# Patient Record
Sex: Male | Born: 1960 | Race: Asian | Hispanic: No | Marital: Married | Smoking: Current every day smoker
Health system: Southern US, Community
[De-identification: ages and names within clinical notes are randomized; demographics above are authoritative.]

## PROBLEM LIST (undated history)

## (undated) DIAGNOSIS — D649 Anemia, unspecified: Secondary | ICD-10-CM

## (undated) DIAGNOSIS — Z9481 Bone marrow transplant status: Secondary | ICD-10-CM

## (undated) DIAGNOSIS — Z923 Personal history of irradiation: Secondary | ICD-10-CM

## (undated) DIAGNOSIS — M549 Dorsalgia, unspecified: Secondary | ICD-10-CM

## (undated) DIAGNOSIS — M1990 Unspecified osteoarthritis, unspecified site: Secondary | ICD-10-CM

## (undated) DIAGNOSIS — Z5111 Encounter for antineoplastic chemotherapy: Secondary | ICD-10-CM

## (undated) HISTORY — DX: Anemia, unspecified: D64.9

## (undated) HISTORY — DX: Encounter for antineoplastic chemotherapy: Z51.11

## (undated) HISTORY — DX: Bone marrow transplant status: Z94.81

## (undated) HISTORY — DX: Unspecified osteoarthritis, unspecified site: M19.90

## (undated) HISTORY — DX: Personal history of irradiation: Z92.3

## (undated) HISTORY — DX: Dorsalgia, unspecified: M54.9

---

## 2003-11-18 ENCOUNTER — Emergency Department (HOSPITAL_COMMUNITY): Admission: EM | Admit: 2003-11-18 | Discharge: 2003-11-18 | Payer: Self-pay | Admitting: Emergency Medicine

## 2003-11-26 ENCOUNTER — Ambulatory Visit (HOSPITAL_COMMUNITY): Admission: RE | Admit: 2003-11-26 | Discharge: 2003-11-26 | Payer: Self-pay | Admitting: Orthopedic Surgery

## 2003-11-26 ENCOUNTER — Ambulatory Visit (HOSPITAL_BASED_OUTPATIENT_CLINIC_OR_DEPARTMENT_OTHER): Admission: RE | Admit: 2003-11-26 | Discharge: 2003-11-26 | Payer: Self-pay | Admitting: Orthopedic Surgery

## 2003-11-26 HISTORY — PX: OTHER SURGICAL HISTORY: SHX169

## 2007-02-04 ENCOUNTER — Emergency Department (HOSPITAL_COMMUNITY): Admission: EM | Admit: 2007-02-04 | Discharge: 2007-02-04 | Payer: Self-pay | Admitting: Emergency Medicine

## 2007-10-08 ENCOUNTER — Inpatient Hospital Stay (HOSPITAL_COMMUNITY): Admission: EM | Admit: 2007-10-08 | Discharge: 2007-10-09 | Payer: Self-pay | Admitting: Emergency Medicine

## 2008-10-13 ENCOUNTER — Inpatient Hospital Stay (HOSPITAL_COMMUNITY): Admission: EM | Admit: 2008-10-13 | Discharge: 2008-10-16 | Payer: Self-pay | Admitting: Emergency Medicine

## 2010-06-03 LAB — BASIC METABOLIC PANEL
BUN: 15 mg/dL (ref 6–23)
BUN: 8 mg/dL (ref 6–23)
CO2: 24 mEq/L (ref 19–32)
Calcium: 9.1 mg/dL (ref 8.4–10.5)
Chloride: 106 mEq/L (ref 96–112)
Creatinine, Ser: 1.04 mg/dL (ref 0.4–1.5)
GFR calc Af Amer: >60 mL/min (ref >60)
GFR calc Af Amer: >60 mL/min (ref >60)
Potassium: 4.1 mEq/L (ref 3.5–5.1)

## 2010-06-03 LAB — FECAL LACTOFERRIN, QUANT

## 2010-06-03 LAB — GIARDIA/CRYPTOSPORIDIUM SCREEN(EIA)
Cryptosporidium Screen (EIA): NEGATIVE
Giardia Screen - EIA: NEGATIVE

## 2010-06-03 LAB — HEPATIC FUNCTION PANEL
AST: 17 U/L (ref 0–37)
Albumin: 2.4 g/dL — ABNORMAL LOW (ref 3.5–5.2)
Alkaline Phosphatase: 42 U/L (ref 39–117)
Bilirubin, Direct: 0.1 mg/dL (ref 0.0–0.3)
Indirect Bilirubin: 0.7 mg/dL (ref 0.3–0.9)
Total Protein: 7 g/dL (ref 6.0–8.3)

## 2010-06-03 LAB — CULTURE, BLOOD (ROUTINE X 2): Culture: NO GROWTH

## 2010-06-03 LAB — DIFFERENTIAL
Basophils Relative: 0 % (ref 0–1)
Basophils Relative: 0 % (ref 0–1)
Eosinophils Absolute: 0.1 10*3/uL (ref 0.0–0.7)
Lymphocytes Relative: 25 % (ref 12–46)
Lymphs Abs: 1.4 10*3/uL (ref 0.7–4.0)
Monocytes Absolute: 0.1 10*3/uL (ref 0.1–1.0)
Monocytes Relative: 4 % (ref 3–12)
Neutro Abs: 3.8 10*3/uL (ref 1.7–7.7)
Neutrophils Relative %: 44 % (ref 43–77)
Neutrophils Relative %: 69 % (ref 43–77)

## 2010-06-03 LAB — EXPECTORATED SPUTUM ASSESSMENT W GRAM STAIN, RFLX TO RESP C

## 2010-06-03 LAB — URINALYSIS, ROUTINE W REFLEX MICROSCOPIC
Glucose, UA: NEGATIVE mg/dL
Hgb urine dipstick: NEGATIVE
Protein, ur: NEGATIVE mg/dL
Specific Gravity, Urine: 1.037 — ABNORMAL HIGH (ref 1.005–1.030)
Urobilinogen, UA: 1 mg/dL (ref 0.0–1.0)
pH: 6 (ref 5.0–8.0)

## 2010-06-03 LAB — CBC
HCT: 30 % — ABNORMAL LOW (ref 39.0–52.0)
MCHC: 32.9 g/dL (ref 30.0–36.0)
MCHC: 33 g/dL (ref 30.0–36.0)
MCV: 85.9 fL (ref 78.0–100.0)
MCV: 86.3 fL (ref 78.0–100.0)
RBC: 3.65 MIL/uL — ABNORMAL LOW (ref 4.22–5.81)
RBC: 4.39 MIL/uL (ref 4.22–5.81)
WBC: 5.6 10*3/uL (ref 4.0–10.5)

## 2010-06-03 LAB — STOOL CULTURE

## 2010-06-03 LAB — LIPASE, BLOOD: Lipase: 18 U/L (ref 11–59)

## 2010-06-03 LAB — HEPATITIS C ANTIBODY: HCV Ab: NEGATIVE

## 2010-07-11 NOTE — H&P (Signed)
Shane Woodard                    ACCOUNT NO.:  [id number]   MEDICAL RECORD NO.:  [id number]          PATIENT TYPE:  INP   LOCATION:  5121                         FACILITY:  [facility]   PHYSICIAN:  Virgie Dad, MD     DATE OF BIRTH:  November 04, 1960   DATE OF ADMISSION:  10/13/2008  DATE OF DISCHARGE:                              HISTORY & PHYSICAL   PRIMARY CARE PHYSICIAN:  Unassigned.   CHIEF COMPLAINT:  Abdominal pain of 5 days duration, vomiting of 5 days  duration every time he takes anything by mouth including his medicine  Cipro and omeprazole.   HISTORY OF PRESENT ILLNESS:  This is a 50 year old [place] immigrant,  living in [place] for more than 12 years, born in [place] and the  last visit to [place] was in the year 2000.  He is complaining of  periumbilical pain and crampy abdominal pain lasting 2-3 seconds, so  painful that he doubles up.  This has been going on for 5 days and  remembered it started after taking a rice dinner at home.  The rest of  the family including wife and 2 children do not have the similar  condition.  He was seen by an MD and was given Cipro for food  poisoning but then when he takes Cipro, he would throw up and throw up  and that he has not drunk and eaten for 5 days, whether he takes juice  or even a little food he starts vomiting.  He has no fever.  He did not  have any similar episode of this in the past.   PAST MEDICAL HISTORY:  No previous surgery.  He was admitted last year  for complaints of arthralgia and arthritis and had a workup for this  particular complaint but no definitive diagnosis.   SOCIAL HISTORY:  He works at [employer] and used to smoke a pack a day,  but has not smoked for the past 1 year and he drinks beer every now and  then.   FAMILY HISTORY:  The family, wife and 2 children are healthy.  No  diabetes.  No hypertension.  No illness that runs in the family.  The  patient himself states he has been healthy while in  [place] and was not  treated for any gastroenteritis or parasite.   REVIEW OF SYSTEMS:  He has chronic cough for the last 10 years, cough is  nonproductive, which he blames the cigarettes, but he had already  stopped smoking and he still continues to cough and usually with  wheezing.  He had seen a doctor and was told it was allergy.  No weight  loss before but believe that since he has been vomiting for 5 days, he  had lost 10 pounds.  Pertinent complaints of polyarthralgia of the major  joints and PIP for the last 5-6 days.  He has been having abdominal pain  and vomiting.  Denies any urinary problems.  Denies any exposure to  venereal disease.  No history of hepatitis.   LABORATORY DATA:  Lab  work in the emergency room, WBC 5,600, hemoglobin  12.3, hematocrit 37.7, and platelet 280,000.  There is no shift to the  left.  Sodium 133, potassium 4.1, chloride 102, CO2 23, glucose 104, BUN  15, creatinine 1, calcium 9.1, and lipase is 18.  Urinalysis is  negative.  Lactic acid is 0.9, albumin is 2.4, and liver function test  is normal.  CT of the abdomen showed that he has mucosal thickening of  the small bowel of the mid abdomen.  There is no transition point of the  air fluid levels.  Differential diagnoses include inflammation,  infection, or ischemia.   Dr. Andrey Campanile; Surgery has been called and he came to the see the patient.  No surgical intervention and he does not believe that the patient's  abnormal CT is from ischemic mesenteritis.   PHYSICAL EXAMINATION:  VITAL SIGNS:  Temperature 98.2, blood pressure  128/90, pulse 82, respiration 20, and O2 sat on room air is 99%.  SKIN:  Warm and dry.  No dermatitis.  HEENT:  Pupils are equal and reacting to light.  No scleral icterus.  No  subconjunctival hemorrhage.  Oral mucosa moist.  NECK:  Neck veins are flat.  Carotid upstroke normal.  Thyroid gland is  normal.  CHEST:  Bilateral wheezing.  Cleared on coughing and the patient has   nonproductive cough.  COR:  90 per minute.  S1 and S2 normal.  No S3.  No S4.  No murmur.  ABDOMEN:  Periumbilical tenderness but minimal guarding.  Bowel sounds  are normoactive.  There is no rebound tenderness.  No bruit heard over  the abdominal aorta.  No palpable masses.  EXTREMITIES:  No sign of DVT.  No pedal edema.  Pedal pulses are normal.  NEUROLOGIC:  Entirely normal both cerebral, cerebellar, cranial, and  spinal nerves.   ASSESSMENT AND PLAN:  1. Abdominal pain with vomiting.  Not able to tolerate p.o.      medication.  Possible cause of mid abdomen mucosal thickening and      ileus, acute gastroenteritis like Salmonella endotoxin poisoning.      Stools are sent for fecal leukocyte.  Culture on ova and parasite.      We will keep the patient n.p.o. for tonight, hydrate with normal      saline 125 mL/hour.  Potassium is normal at 4.1, so no addition of      potassium to the IV fluid.  We will restart IV Cipro 400 mg every      12 hours.  Pending blood cultures.  2. Polyarthralgia with swelling of the proximal interphalangeal.  We      will order CRP and RA factor.  3. Chronic cough on previously cigarette smoking patient.  We will      give albuterol for the bronchospasm.  Once stable, we will order      pulmonary function test to assess pulmonary function and rule out      chronic obstructive pulmonary disease or restrictive lung disease.   PROGNOSIS:  Good.   EFFECTS:  Full Code.      Virgie Dad, MD  Electronically Signed     EA/MEDQ  D:  10/13/2008  T:  10/14/2008  Job:  [id number]

## 2010-07-11 NOTE — Discharge Summary (Signed)
Shane Woodard, SURGEON                    ACCOUNT NO.:  [id number]   MEDICAL RECORD NO.:  [id number]          PATIENT TYPE:  INP   LOCATION:  5121                         FACILITY:  [facility]   PHYSICIAN:  Beckey Rutter, MD  DATE OF BIRTH:  1960-11-24   DATE OF ADMISSION:  10/13/2008  DATE OF DISCHARGE:  10/16/2008                               DISCHARGE SUMMARY   PRIMARY CARE PHYSICIAN:  Unassigned to [facility].   BRIEF HISTORY OF PRESENT ILLNESS:  A very pleasant [place] gentleman  presented with abdominal pain for 5 days' duration and vomiting for 5  days' duration.   HOSPITAL COURSE:  During the hospital course, the patient was kept  n.p.o. with improvement of his symptoms.  He had CT scan as the results  suggesting ischemia versus nonspecific bowel inflammation.  The patient  was continued on intravenous Cipro and since yesterday started on food.  The patient is able to tolerate regular food currently.  He is stable  for discharge.   DISCHARGE CONSULTATION:  [name], M.D., with [facility].   LABORATORY TEST:  His Giardia was negative.  His stool culture is  negative.  Blood culture is negative.  Recent BMET showing sodium 134,  potassium 3.6, chloride 106, bicarb is 24, glucose 72, BUN is 8;  creatinine is 1.0.  White blood count as of yesterday is 3.4, hemoglobin  is 10.4, hematocrit is 31.5, platelet count is 250.  Fecal lactoferrin  is negative.  Sputum culture is negative.  Hepatitis C antibody is  negative.  Hepatitis B surface antibody is negative.  Hepatitis A  antibody is negative.  C-reactive protein is 1.3.  Amylase is 66.  Lipase is 18.   DISCHARGE DIAGNOSES:  1. Nonspecific enteritis could be secondary to Salmonella toxin.  2. Nausea and vomiting, resolved.   DISCHARGE MEDICATIONS:  Cipro 500 mg p.o. twice a day for 3 more days.   DISCHARGE PLAN:  The patient was discharged to follow up with her  primary physician within 1-2 weeks.  He is  aware and agreeable to  discharge plan.      Beckey Rutter, MD  Electronically Signed     EME/MEDQ  D:  10/16/2008  T:  10/16/2008  Job:  [id number]
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
## 2010-07-11 NOTE — Discharge Summary (Signed)
Shane Woodard, Shane Woodard                    ACCOUNT NO.:  [id number]   MEDICAL RECORD NO.:  [id number]          PATIENT TYPE:  INP   LOCATION:  6731                         FACILITY:  [facility]   PHYSICIAN:  Renee Ramus, MD       DATE OF BIRTH:  19-Jun-1960   DATE OF ADMISSION:  10/08/2007  DATE OF DISCHARGE:  10/09/2007                               DISCHARGE SUMMARY   PRIMARY DIAGNOSIS:  Bronchitis.   SECONDARY DIAGNOSES:  1. Potential Lyme disease.  2. Chronic back pain.  3. Anemia.   HOSPITAL COURSE:  1. Bronchitis.  The patient is a 50 year old Asian male who was      admitted secondary to bronchitis and severe back pain as well as      fevers and chills.  The patient was initially treated empirically      for meningitis.  He did receive a spinal tap which showed no      evidence of acute bacterial or viral infection.  The patient did      receive IV fluids and antibiotics.  He has now made a good clinical      recovery.  The patient did not have evidence of acute inflammation.      He had a normal white count.  He had no fevers while in-house and      all of his labs and studies had been negative for acute infection,      no acute disease.  The patient is now being discharged to home with      instructions to follow up with his primary care physician as needed      and he will be continued on doxycycline empirically since he had a      rash upon admission.  His doxycycline dosage will continue for the      next 10 days.  2. Lyme disease as above.  Lyme titers are currently pending.  This      will be reviewed upon return.  3. Bronchitis.  This is now resolved.  The patient had no evidence of      mild bronchitis upon chest x-ray but his clinical exam shows no      evidence of acute infection or inflammatory reaction.  4. Anemia.  The patient had a normocytic anemia and I have deferred      workup for this as outpatient.   LABORATORY DATA:  1. Mild anemia with a hemoglobin of 10 and  hematocrit of 31 with MCV      of 86.7.  2. Mild hypoalbuminemia with an albumin of 2.7.  3. UA and CSF cultures and studies were negative for acute infection.   STUDIES:  1. CT of the head shows no acute disease.  2. Chest x-ray showing mild bronchitis, but by my read chest x-ray      shows no acute disease.   DISCHARGE MEDICATIONS:  Doxycycline 100 mg 1 p.o. b.i.d. x10 days.   There were no labs or studies pending at the time of discharge.  The  patient is in stable  condition and anxious for discharge.   Time spent 35 minutes.      Renee Ramus, MD  Electronically Signed     JF/MEDQ  D:  10/09/2007  T:  10/10/2007  Job:  [id number]

## 2010-07-11 NOTE — H&P (Signed)
NAMEJOHNNATHAN, HAGEMEISTER                    ACCOUNT NO.:  [id number]   MEDICAL RECORD NO.:  [id number]          PATIENT TYPE:  EMS   LOCATION:  MAJO                         FACILITY:  [facility]   PHYSICIAN:  Michelene Gardener, MD    DATE OF BIRTH:  06/29/1960   DATE OF ADMISSION:  10/08/2007  DATE OF DISCHARGE:                              HISTORY & PHYSICAL   PRIMARY PHYSICIAN:  The patient admitted as unassigned.   CHIEF COMPLAINT:  Fever, chills, back pain and neck pain.   HISTORY OF PRESENT ILLNESS:  This is a 50 year old [place] male with  no significant past medical history who presents with the above-  mentioned complaint.  This patient is a very poor historian and has  multiple complaints.  The main reason he came to the ER was because he  had been having back pain for the last 6 months that was not improving.  He has been followed by his primary physician, and his pain was mostly  attributed to muscular pain and has been treated with pain medications  without much help.  His pain is mainly in the area of the right scapula.  It is described as a sharp pain with radiation to his right shoulder on  some occasions.  Last time he was seen by his doctor he was given rest  for 1 week and was given some pain medicine but without help.  He was  also complaining of some neck pain and difficulty moving his neck.  Starting this Friday, he has been having fever on and off, but he has  never measured his temperature at home.  He was also having chills  associated with that fever.  He also had some erythema, a rash in his  abdominal area and around the left side of his back, but he does not  know for how long this has been present. She also has dry cough started  yesterday and was coughing during my interview. In the ER, he had a  fever of 101.0.  White count was normal.  A chest x-ray showed no acute  problems.  A CT scan of his head was normal.  The patient was given 2  grams of IV Rocephin, 1.5  grams of vancomycin.  Lumbar puncture was  done, and results are pending at this time.   PAST MEDICAL HISTORY:  Denied.   PAST SURGICAL HISTORY:  Denied.   ALLERGIES:  No known drug allergies.   CURRENT MEDICATIONS:  Denied.   SOCIAL HISTORY:  He smokes 1/2 pack per day and he has been smoking  since he was 50 years old.  He drinks beer on the weekend, and he  stopped spirit drinking around 10 years ago.  He denies recreational  drugs.   FAMILY HISTORY:  The patient does not know much about his family.   REVIEW OF SYSTEMS:  CONSTITUTIONAL:  Positive for fever, chills,  sweating and fatigability.  EYES:  No blurred vision but actually he had  one occasion of blurred vision, loss of vision in his right eye.  EARS:  No tinnitus.  RESPIRATORY:  Positive for cough.  GASTROINTESTINAL:  No  nausea, no vomiting, no diarrhea.  GENITOURINARY:  No dysuria, no  hematuria.  ENDOCRINE:  No polyuria, no nocturia.  HEMATOLOGIC:  No  bruises, no bleeding.  ID:  Positive for rash.  There are no lesions.  NEURO:  No numbness or tingling.  The rest of the systems were reviewed,  and they were negative.   PHYSICAL EXAMINATION:  VITAL SIGNS:  Temperature 101, blood pressure  130/79, pulse 103, respiratory rate is 18.  GENERAL APPEARANCE:  This is a middle-aged Asian male, not in acute  distress.  HEENT:  His conjunctivae are red.  His pupils are equal and reactive to  light.  There is no ptosis.  Hearing is intact.  There is no ear  discharge or infection.  There is no nose infection or bleeding.  Oral  mucosa is dry.  No pharyngeal erythema.  NECK:  Stiff with mild problem moving his neck.  There is no tenderness.  There is no thyroid enlargement.  There is no lymphadenopathy, and there  is no carotid bruit.  CARDIOVASCULAR:  S1 and S2 are regular.  There are no murmurs, no  gallops and no thrills.  RESPIRATORY:  The patient is breathing between 16 to 18.  There are no  rales, no rhonchi, and  there are no wheezes.  ABDOMEN:  Soft.  Not distended.  No tenderness.  No hepatosplenomegaly.  Bowel sounds are present.  EXTREMITIES:  Lower extremities:  No edema, no rash, and no varicose  veins.  SKIN:  No rash and no erythema.  NEURO:  Cranial nerves are intact from II-XII.  Motor exam:  Strength is  5/5 in all 4 extremities.  Reflexes are 2/2 in all major reflexes.  Sensation is intact.   LABORATORY RESULTS:  WBC 5.1, hemoglobin 10.1, hematocrit 30.9, MCV  86.5, platelet count is 279.  Sodium 133, potassium 3.6, chloride 102,  bicarb 25, glucose 94, BUN 11, creatinine 0.98.  Bilirubin 0.4, alkaline  phosphatase 48, AST 19, ALT 13.  A chest x-ray showed no acute findings.  A CT scan showed no intracranial abnormalities.   IMPRESSION:  1. Systemic inflammatory response syndrome.  2. Rule out meningitis.  3. Rule out H1N1 flu.  4. Chronic back pain, most likely secondary to muscular strain.  5. Rash, rule out Lyme disease.   PLAN:  1. Systemic inflammatory response syndrome.  This patient had fever,      generalized weakness.  So far, source of infection is unclear.  His      chest x-ray is normal.  Meningitis would be one of the      possibilities given the fact that his neck is stiff ( although he      has had this stiffness and neck for a long time).  He already      received one dose of Rocephin and one dose of vancomycin in the ER.      LP was done, and results are still pending.  I will start him on      Rocephin 2 grams IV once a day, vancomycin 1 gram IV q.12 h and      ampicillin 500 mg IV q. 4 hours and acyclovir 10 mg/kg bid.  I will      also put him in isolation.  We will follow his LP results.  I will      also send for urinalysis, urine  culture and a sputum culture and      blood culture to cover the possibilities of other source of      infection.  2. Rule out H1N1 flu.  As mentioned, this patient has fever with      generalized weakness, and he started  coughing in the last 2 days,      and while I was interviewing him he had a lot of cough.  We will      start him with Tamiflu.  I will get H1N1 swab, and we will put him      in droplet isolation.  3. Chronic back pain.  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  Upon his exam, his right scapula is tender, and his pain most      likely is muscular pain related to his work.  We will put him on      pain medications as needed.  Will get ESR  4. Neck pain and stiffness.  As mentioned, the possibility of      meningitis is present, but I think it is low possibility, and most      likely this pain and stiffness is all related to his work because      it has been chronic for more than a few months.  5. Rash.  This patient has a rash in his abdomen, which is suspicious      for Lyme disease.  Lyme ELISA is already sent by the ER.  I will      start the patient on doxycycline 100 mg twice daily for a total of      14 days.   Assessment time is 50 minutes.      Michelene Gardener, MD  Electronically Signed     NAE/MEDQ  D:  10/08/2007  T:  10/08/2007  Job:  [id number]

## 2010-07-14 NOTE — Op Note (Signed)
Shane Woodard, Shane Woodard                    ACCOUNT NO.:  [id number]   MEDICAL RECORD NO.:  [id number]          PATIENT TYPE:  AMB   LOCATION:  DSC                          FACILITY:  [facility]   PHYSICIAN:  Katy Fitch. Sypher Jr., M.D.DATE OF BIRTH:  1960-09-27   DATE OF PROCEDURE:  11/26/2003  DATE OF DISCHARGE:                                 OPERATIVE REPORT   PREOPERATIVE DIAGNOSIS:  Laceration of dorsal aspect of left thumb  metacarpophalangeal joint with loss of extension of interphalangeal joint  and extensor lag at metacarpophalangeal joint.   POSTOPERATIVE DIAGNOSIS:  Laceration of dorsal aspect of left thumb  metacarpophalangeal joint with loss of extension of interphalangeal joint  and extensor lag at metacarpophalangeal joint.   OPERATION:  Repair of extensor pollicis longus and extensor aponeurosis,  left thumb metacarpophalangeal joint.   OPERATING SURGEON:  Katy Fitch. Sypher, M.D.   ASSISTANT:  Jonni Sanger, P.A.   ANESTHESIA:  General by LMA.   SUPERVISING ANESTHESIOLOGIST:  Maren Beach, M.D.   INDICATIONS:  [name] was brought to the operating room and placed in  supine position on the operating table.  Following induction of general  anesthesia by LMA, the left arm was prepped with Betadine soaping solution  and sterilely draped.  Following exsanguination of the limb with an Esmarch  bandage, an arterial tourniquet on the proximal brachium was inflated to 220  mmHg.   Procedure commenced with a removal of the sutures placed in the emergency  room.  The wound was then irrigated and foreign material removed.  The  injury involved the entire ulnar aspect of the extensor aponeurosis and the  extensor pollicis longus tendon.  A portion of the extensor pollicis brevis  remained intact.   The joint capsule had been violated.  The joint was thoroughly irrigated  with sterile saline.  One gram of Ancef was administered as an IV  prophylactic antibiotic, once the  joint injury was identified.   The extensor pollicis longus tendon was repaired with a coarse suture of 4-0  Mersilene with grasping technique, followed by use of multiple mattress  sutures of 4-0 Mersilene to repair the extensor aponeurosis anatomically.   The extensor lag at the MP and IP joint was corrected.   The wound was then repaired with intradermal 3-0 Prolene.  A compressive  dressing was applied with the thumb spica splint, maintaining the IP joint  and MP joint in full extension.  There were no apparent complications.   Mr. Sidor will be discharged with prescriptions for Vicodin 5 mg 1 p.o. q.4-6  h. p.r.n. pain, 30 tablets without refill, also Keflex 500 mg 1 p.o. q.8 h.  x4 days as a prophylactic antibiotic.   He is expected to return to office in followup in a week to be reexamined.  We anticipate suture removal at that time and advancement to a thermoplastic  splint.       RVS/MEDQ  D:  11/26/2003  T:  11/27/2003  Job:  [id number]

## 2010-11-24 LAB — URINALYSIS, MICROSCOPIC ONLY
Bilirubin Urine: NEGATIVE
Hgb urine dipstick: NEGATIVE
Ketones, ur: NEGATIVE
Nitrite: NEGATIVE
Protein, ur: NEGATIVE
Specific Gravity, Urine: 1.024
Urobilinogen, UA: 1

## 2010-11-24 LAB — PROTEIN, CSF: Total  Protein, CSF: 35

## 2010-11-24 LAB — BASIC METABOLIC PANEL
CO2: 25
Chloride: 110
Creatinine, Ser: 0.93
GFR calc Af Amer: >60
Potassium: 4.6
Sodium: 139

## 2010-11-24 LAB — GLUCOSE, CSF: Glucose, CSF: 54

## 2010-11-24 LAB — COMPREHENSIVE METABOLIC PANEL
BUN: 11
CO2: 25
Calcium: 8.7
Creatinine, Ser: 0.98
GFR calc Af Amer: >60
GFR calc non Af Amer: >60
Glucose, Bld: 94

## 2010-11-24 LAB — CSF CELL COUNT WITH DIFFERENTIAL
RBC Count, CSF: 0
RBC Count, CSF: 0
WBC, CSF: 4
WBC, CSF: 5

## 2010-11-24 LAB — EXPECTORATED SPUTUM ASSESSMENT W GRAM STAIN, RFLX TO RESP C

## 2010-11-24 LAB — URINE CULTURE: Colony Count: NO GROWTH

## 2010-11-24 LAB — DIFFERENTIAL
Basophils Absolute: 0
Lymphocytes Relative: 21
Lymphs Abs: 1.1
Neutrophils Relative %: 73

## 2010-11-24 LAB — CBC
HCT: 30.9 — ABNORMAL LOW
HCT: 31 — ABNORMAL LOW
Hemoglobin: 10.1 — ABNORMAL LOW
Hemoglobin: 10.1 — ABNORMAL LOW
MCHC: 32.7
MCV: 86.5
RBC: 3.57 — ABNORMAL LOW
RBC: 3.58 — ABNORMAL LOW
RDW: 15.7 — ABNORMAL HIGH
WBC: 6

## 2010-11-24 LAB — CSF CULTURE W GRAM STAIN: Culture: NO GROWTH

## 2010-11-24 LAB — CULTURE, BLOOD (ROUTINE X 2)

## 2010-11-24 LAB — URINALYSIS, ROUTINE W REFLEX MICROSCOPIC
Bilirubin Urine: NEGATIVE
Hgb urine dipstick: NEGATIVE
Nitrite: NEGATIVE
Specific Gravity, Urine: 1.02
pH: 7.5

## 2010-11-24 LAB — GRAM STAIN

## 2010-11-24 LAB — H1N1 SCREEN (PCR): H1N1 Virus Scrn: NOT DETECTED

## 2010-11-24 LAB — RPR: RPR Ser Ql: NONREACTIVE

## 2010-11-24 LAB — ROCKY MTN SPOTTED FVR AB, IGM-BLOOD: RMSF IgM: 0.07 IV

## 2010-11-24 LAB — SEDIMENTATION RATE: Sed Rate: 136 — ABNORMAL HIGH

## 2010-11-24 LAB — MAGNESIUM: Magnesium: 1.9

## 2010-11-24 LAB — B. BURGDORFI ANTIBODIES: B burgdorferi Ab IgG+IgM: 0.05

## 2010-11-24 LAB — PHOSPHORUS: Phosphorus: 2.9

## 2010-12-04 LAB — URINALYSIS, ROUTINE W REFLEX MICROSCOPIC
Hgb urine dipstick: NEGATIVE
Nitrite: NEGATIVE
Specific Gravity, Urine: 1.031 — ABNORMAL HIGH
Urobilinogen, UA: 1
pH: 6

## 2010-12-04 LAB — URINE MICROSCOPIC-ADD ON

## 2011-03-01 ENCOUNTER — Other Ambulatory Visit: Payer: Self-pay | Admitting: Internal Medicine

## 2011-03-01 DIAGNOSIS — R109 Unspecified abdominal pain: Secondary | ICD-10-CM

## 2011-03-02 ENCOUNTER — Ambulatory Visit
Admission: RE | Admit: 2011-03-02 | Discharge: 2011-03-02 | Disposition: A | Discharge status: Home / Self Care | Payer: No Typology Code available for payment source | Source: Ambulatory Visit | Attending: Internal Medicine | Admitting: Internal Medicine

## 2011-03-02 DIAGNOSIS — R109 Unspecified abdominal pain: Secondary | ICD-10-CM

## 2011-03-02 MED ORDER — IOHEXOL 300 MG/ML  SOLN
100.0000 mL | Freq: Once | INTRAMUSCULAR | Status: AC | PRN
  Administered 2011-03-02: 100 mL via INTRAVENOUS

## 2011-03-07 ENCOUNTER — Telehealth: Payer: Self-pay | Admitting: Oncology

## 2011-03-07 ENCOUNTER — Encounter: Payer: Self-pay | Admitting: Oncology

## 2011-03-07 DIAGNOSIS — M1990 Unspecified osteoarthritis, unspecified site: Secondary | ICD-10-CM | POA: Insufficient documentation

## 2011-03-07 DIAGNOSIS — D649 Anemia, unspecified: Secondary | ICD-10-CM | POA: Insufficient documentation

## 2011-03-07 NOTE — Telephone Encounter (Signed)
Referred by Dr. Donette Larry Dx- Decreased RBC

## 2011-03-07 NOTE — Telephone Encounter (Signed)
S/w the pt and he is aware of the appt d/t on 03/08/2011@10 :30am. Per pt he can make the appt he will have his dad call us to get directions to the hospital

## 2011-03-08 ENCOUNTER — Ambulatory Visit (HOSPITAL_BASED_OUTPATIENT_CLINIC_OR_DEPARTMENT_OTHER): Payer: No Typology Code available for payment source | Admitting: Oncology

## 2011-03-08 ENCOUNTER — Ambulatory Visit: Payer: No Typology Code available for payment source

## 2011-03-08 ENCOUNTER — Encounter: Payer: Self-pay | Admitting: Oncology

## 2011-03-08 ENCOUNTER — Telehealth: Payer: Self-pay | Admitting: Oncology

## 2011-03-08 ENCOUNTER — Other Ambulatory Visit: Payer: No Typology Code available for payment source | Admitting: Lab

## 2011-03-08 DIAGNOSIS — M1990 Unspecified osteoarthritis, unspecified site: Secondary | ICD-10-CM

## 2011-03-08 DIAGNOSIS — R778 Other specified abnormalities of plasma proteins: Secondary | ICD-10-CM

## 2011-03-08 DIAGNOSIS — M949 Disorder of cartilage, unspecified: Secondary | ICD-10-CM

## 2011-03-08 DIAGNOSIS — M129 Arthropathy, unspecified: Secondary | ICD-10-CM

## 2011-03-08 DIAGNOSIS — D649 Anemia, unspecified: Secondary | ICD-10-CM

## 2011-03-08 DIAGNOSIS — R799 Abnormal finding of blood chemistry, unspecified: Secondary | ICD-10-CM

## 2011-03-08 LAB — MORPHOLOGY

## 2011-03-08 LAB — CBC & DIFF AND RETIC
BASO%: 0.2 % (ref 0.0–2.0)
Basophils Absolute: 0 10*3/uL (ref 0.0–0.1)
EOS%: 6.2 % (ref 0.0–7.0)
MCH: 26.1 pg — ABNORMAL LOW (ref 27.2–33.4)
MCHC: 30.9 g/dL — ABNORMAL LOW (ref 32.0–36.0)
MCV: 84.4 fL (ref 79.3–98.0)
MONO%: 2.5 % (ref 0.0–14.0)
RBC: 3.72 10*6/uL — ABNORMAL LOW (ref 4.20–5.82)
RDW: 17.3 % — ABNORMAL HIGH (ref 11.0–14.6)
Retic %: 0.91 % (ref 0.80–1.80)

## 2011-03-08 MED ORDER — TRAMADOL HCL 50 MG PO TABS: 50.0000 mg | ORAL_TABLET | Freq: Three times a day (TID) | ORAL | Status: AC | PRN

## 2011-03-08 NOTE — Progress Notes (Signed)
[facility] INITIAL HEMATOLOGY CONSULTATION  Referral MD:  Dr. Georgann Woodard, M.D.   Reason for Referral: anemia.     HPI: Shane Woodard is a 51yo [place] Woodard with past medical history of anemia dating back for the past few years.  Work up in the past was negative for source of anemia.  For the past 6 months he is been having worsening pain in the left ribs anteriorly. About a year ago he hit a big machine at [facility] and has had this left rib pain since then.  However, for the last 6 months, the pain has worsened to the point where whenever he said that or placed down he has excruciating pain in this area.  Pain is described as 10 out of 10 and only eases little bit with over-the-counter pain medication such as Tylenol or ibuprofen.  Pain is described as sharp and does not radiate. Over the past few months he is been having also chronic low back pain which been getting worse. He also describes generalized fatigue and does not want to do anything. He has stable appetite and stable weight. He denies any she shortness of breath, dyspnea on exertion, chest pain, abdominal pain, leading symptom, lower extremity weakness, nausea paresthesia, bowel bladder incontinence.  He has noticed that for the past few months he has been getting darker skin in the back of his hands bilaterally without any skin rash or bleeding symptoms.     Past Medical History  Diagnosis Date  . Anemia   . Arthritis   :    History reviewed. No pertinent past surgical history.:   CURRENT MEDS: Current Outpatient Prescriptions  Medication Sig Dispense Refill  . traMADol (ULTRAM) 50 MG tablet Take 1 tablet (50 mg total) by mouth every 8 (eight) hours as needed for pain.  90 tablet  0      No Known Allergies:  History reviewed. No pertinent family history.:  History   Social History  . Marital Status: Married    Spouse Name: N/A    Number of Children: 2  . Years of Education: N/A    Occupational History  .      [employer]   Social History Main Topics  . Smoking status: Former Smoker -- 30 years    Quit date: 02/27/2007  . Smokeless tobacco: Never Used  . Alcohol Use: 1.2 oz/week    2 Cans of beer per week  . Drug Use: No  . Sexually Active: Yes    Birth Control/ Protection: None   Other Topics Concern  . Not on file   Social History Narrative  . No narrative on file  :  REVIEW OF SYSTEM:  The rest of the 14-point review of sytem was negative.   Exam:  General:  well-nourished in no acute distress.  Eyes:  no scleral icterus.  ENT:  There were no oropharyngeal lesions.  Neck was without thyromegaly.  Lymphatics:  Negative cervical, supraclavicular or axillary adenopathy.  Respiratory: lungs were clear bilaterally without wheezing or crackles.  Cardiovascular:  Regular rate and rhythm, S1/S2, without murmur, rub or gallop.  There was no pedal edema.  GI:  abdomen was soft, flat, nontender, nondistended, without organomegaly.  Muscoloskeletal:  no spinal tenderness of palpation of vertebral spine.  there was discomfort in palpations of anterior lower ribs with any palpable mass. Skin exam was without echymosis, petichae.   there was hyperpigmented overall skin changes of the extensor surface of bilateral  hands. Neuro exam was nonfocal.  Patient was able to get on and off exam table without assistance.  Gait was normal.  Patient was alerted and oriented.  Attention was good.   Language was appropriate.  Mood was normal without depression.  Speech was not pressured.  Thought content was not tangential.    LABS:  CBC    Component Value Date/Time   WBC 4.0 03/08/2011 1105   WBC 3.4* 10/15/2008 0502   RBC 3.72* 03/08/2011 1105   RBC 3.65* 10/15/2008 0502   HGB 9.7* 03/08/2011 1105   HGB 10.4* 10/15/2008 0502   HCT 31.4* 03/08/2011 1105   HCT 31.5* 10/15/2008 0502   PLT 244 03/08/2011 1105   PLT 250 10/15/2008 0502   MCV 84.4 03/08/2011 1105   MCV 86.3 10/15/2008 0502    MCH 26.1* 03/08/2011 1105   MCHC 30.9* 03/08/2011 1105   MCHC 33.0 10/15/2008 0502   RDW 17.3* 03/08/2011 1105   RDW 16.1* 10/15/2008 0502   LYMPHSABS 1.7 03/08/2011 1105   LYMPHSABS 1.7 10/15/2008 0502   MONOABS 0.1 03/08/2011 1105   MONOABS 0.1 10/15/2008 0502   EOSABS 0.3 03/08/2011 1105   EOSABS 0.1 10/15/2008 0502   BASOSABS 0.0 03/08/2011 1105   BASOSABS 0.0 10/15/2008 0502   Na 133; Cr 1.03; Ca 8; Tprot 11.2; LDH 67.  Blood smear review:   I personally reviewed the patient's peripheral blood smear today.  There was anisocytosis.  There was no peripheral blast.  There was no schistocytosis, spherocytosis, target cell, tear drop cell.  There was rouleaux formation. There was no giant platelets or platelet clumps.      ASSESSMENT AND PLAN:   1.  Anemia:  Chronic normocytic anemia however was slightly elevated RDW. Most likely this is related to multiple myeloma as discussed below. However cannot rule out other causes of anemia. Therefore I sent for today Vit12 and iron panel which does show any of these etiologies.  2.  Bone pain, elevated total protein, hyponatremia, anemia: These may be very suspicious for plasma cell dyscrasia process such as active multiple myeloma. I sent for today serum protein electrophoresis, serum free light chain, urine collection for protein electrophoresis. I strongly recommended patient undergoing diagnostic bone marrow biopsy which was arranged for next week.   I also referred him for skeletal x-ray to rule out lytic lesion.  I explained in extensive detail for Mr. Puff the possibility of multiple myeloma, its pathophysiology, its complications including but not limited to hyponatremia, renal insufficiency, hypercalcemia, lytic bone lesion. I discussed with him that the blood tests from today can only be suggestive of multiple are not diagnostic compared to a bone marrow biopsy.  Therefore I will see him again in late January 2013 to go to his overall this blood  test and bone marrow biopsy and skeletal x-ray and tie  together a diagnosis and treatment plan at that time.  However I discussed with him that this disease is rather treatable as not to worry him previously. He inquired about the possibility of bone marrow transplant.  I discussed with him that this is indeed a possibility for young patients with multiple myeloma. However patients requires induction chemotherapy before this treatment.   3.  Symptom control:  He is not having good relief with over-the-counter pain medication. I prescribed him tramadol for now. In the future if despite chemotherapy and tramadol and he still has severe bone pain and may consider stronger analgesic at that time.  If he  does indeed have lytic bone lesion for myeloma, that he would benefit from Zometa in the future which may improve his pain is well. On present I have low concern for cord compression as he doesn't have severe back pain, lower extremity weakness, or paresthesia.  If the skeletal x-ray shows that he has disease in the vertebra and his back pain worsens I may consider sending him for MRI in the future.  The length of time of the face-to-face encounter was 45 minutes. More than 50% of time was spent counseling and coordination of care.

## 2011-03-08 NOTE — Telephone Encounter (Signed)
appt made for 1/15 bx,1/17 xray and md visit on 1/25,card for pt   aom

## 2011-03-10 ENCOUNTER — Other Ambulatory Visit: Payer: Self-pay | Admitting: Oncology

## 2011-03-12 LAB — COMPREHENSIVE METABOLIC PANEL
AST: 13 U/L (ref 0–37)
Alkaline Phosphatase: 44 U/L (ref 39–117)
BUN: 12 mg/dL (ref 6–23)
Glucose, Bld: 69 mg/dL — ABNORMAL LOW (ref 70–99)
Sodium: 133 mEq/L — ABNORMAL LOW (ref 135–145)
Total Bilirubin: 0.2 mg/dL — ABNORMAL LOW (ref 0.3–1.2)

## 2011-03-12 LAB — SPEP & IFE WITH QIG
Albumin ELP: 33.1 % — ABNORMAL LOW (ref 55.8–66.1)
Alpha-2-Globulin: 7.1 % (ref 7.1–11.8)
Beta 2: 1.7 % — ABNORMAL LOW (ref 3.2–6.5)
Beta Globulin: 3.9 % — ABNORMAL LOW (ref 4.7–7.2)
IgG (Immunoglobin G), Serum: 7760 mg/dL — ABNORMAL HIGH (ref 650–1600)
Total Protein, Serum Electrophoresis: 11.2 g/dL — ABNORMAL HIGH (ref 6.0–8.3)

## 2011-03-12 LAB — IRON AND TIBC
%SAT: 34 % (ref 20–55)
Iron: 131 ug/dL (ref 42–165)
TIBC: 381 ug/dL (ref 215–435)
UIBC: 250 ug/dL (ref 125–400)

## 2011-03-12 LAB — LACTATE DEHYDROGENASE: LDH: 67 U/L — ABNORMAL LOW (ref 94–250)

## 2011-03-13 ENCOUNTER — Ambulatory Visit: Payer: No Typology Code available for payment source

## 2011-03-13 ENCOUNTER — Other Ambulatory Visit (HOSPITAL_COMMUNITY)
Admission: RE | Admit: 2011-03-13 | Discharge: 2011-03-13 | Disposition: A | Discharge status: Home / Self Care | Payer: No Typology Code available for payment source | Source: Ambulatory Visit | Attending: Oncology | Admitting: Oncology

## 2011-03-13 ENCOUNTER — Encounter: Payer: Self-pay | Admitting: Oncology

## 2011-03-13 ENCOUNTER — Other Ambulatory Visit: Payer: Self-pay | Admitting: Oncology

## 2011-03-13 ENCOUNTER — Ambulatory Visit (HOSPITAL_BASED_OUTPATIENT_CLINIC_OR_DEPARTMENT_OTHER): Payer: No Typology Code available for payment source | Admitting: Oncology

## 2011-03-13 DIAGNOSIS — C9000 Multiple myeloma not having achieved remission: Secondary | ICD-10-CM | POA: Insufficient documentation

## 2011-03-13 DIAGNOSIS — Z23 Encounter for immunization: Secondary | ICD-10-CM

## 2011-03-13 MED ORDER — SULFAMETHOXAZOLE-TRIMETHOPRIM 800-160 MG PO TABS: 1.0000 | ORAL_TABLET | ORAL | Status: DC

## 2011-03-13 MED ORDER — INFLUENZA VIRUS VACC SPLIT PF IM SUSP
0.5000 mL | Freq: Once | INTRAMUSCULAR | Status: AC
  Administered 2011-03-13: 0.5 mL via INTRAMUSCULAR
  Filled 2011-03-13: qty 0.5

## 2011-03-13 MED ORDER — PROCHLORPERAZINE MALEATE 10 MG PO TABS: 10.0000 mg | ORAL_TABLET | Freq: Four times a day (QID) | ORAL | Status: DC | PRN

## 2011-03-13 MED ORDER — ENOXAPARIN SODIUM 40 MG/0.4ML ~~LOC~~ SOLN: 40.0000 mg | SUBCUTANEOUS | Status: DC

## 2011-03-13 MED ORDER — ACYCLOVIR 400 MG PO TABS: 400.0000 mg | ORAL_TABLET | Freq: Every day | ORAL | Status: DC

## 2011-03-13 MED ORDER — ONDANSETRON HCL 8 MG PO TABS: ORAL_TABLET | ORAL | Status: DC

## 2011-03-13 MED ORDER — DEXAMETHASONE 4 MG PO TABS: ORAL_TABLET | ORAL | Status: DC

## 2011-03-13 NOTE — Progress Notes (Signed)
Addended by: Jethro Bolus T on: 03/13/2011 10:10 PM   Modules accepted: Orders

## 2011-03-13 NOTE — Progress Notes (Signed)
Bone marrow biopsy and aspiration.  INDICATION:  Procedure: After obtained from consent, Shane Woodard was placed in the prone position. Time out was performed verifying correct patient and procedure. The skin overlying the left posterior crest was prepped with Betadine and draped in the usual sterile fashion. The skin and periosteum were infiltrated with 10 mL of 2% lidocaine x2 as he was still uncomfortable after the fist 10mL. A small puncture wound was made with #11 scalpel blade.  Bone marrow aspirate was obtained on the first pass of the aspiration needle.  One separate core biopsy was obtained through the same incision.   The aspirate was sent for routine histology, flow cytometry, myeloma FISH and cytogenetics.  Core biopsy was sent for routine histology.   Shane Woodard tolerated procedure well with minimal  blood loss and without immediate complication.   A sterile dressing was applied.   Jethro Bolus M.D. 03/13/2011

## 2011-03-13 NOTE — Progress Notes (Signed)
[facility] OFFICE PROGRESS NOTE  Cc:  [name], MD, MD  DIAGNOSIS: IgG lambda multiple myeloma; presented with anemia, and bone pain.  Initial M-spike was 5.1gm/dL; free serum lambda of 4.09 mg/dL;  Ig G 7760 mg/dL; [id number] of 2.47.  Bone marrow biopsy was performed today; result pending including FISH and cytogenetics.    CURRENT THERAPY: here to go over result and discuss regimen of [name]/Revlimide/Desamthasone prior to evaluation for autologous hematopoetic stem cell transplant.   INTERVAL HISTORY: Shane Woodard 51 y.o. male returns for work in appointment with [place] father-in-law and mother-in-law. His wife couldn't make it to the visit today. He reported that with comedo, his low back pain and rib pain have improved significantly.  He only takes 25 mg by mouth twice a times a day when necessary as supposed to 50 mg because was making him feeling dizzy.  However he still has some bilateral hip pain especially when he tries to lay down. He has mild fatigue and low appetite.    Patient denies headache, visual changes, confusion, drenching night sweats, palpable lymph node swelling, mucositis, odynophagia, dysphagia, nausea vomiting, jaundice, chest pain, palpitation, shortness of breath, dyspnea on exertion, productive cough, gum bleeding, epistaxis, hematemesis, hemoptysis, abdominal pain, abdominal swelling, early satiety, melena, hematochezia, hematuria, skin rash, spontaneous bleeding, joint swelling, joint pain, heat or cold intolerance, bowel bladder incontinence, back pain, focal motor weakness, paresthesia, depression, suicidal or homocidal ideation, feeling hopelessness.   MEDICAL HISTORY: Past Medical History  Diagnosis Date  . Anemia   . Arthritis   . Multiple myeloma     SURGICAL HISTORY: No past surgical history on file.  MEDICATIONS: Current Outpatient Prescriptions  Medication Sig Dispense Refill  . traMADol (ULTRAM) 50 MG tablet Take 1 tablet  (50 mg total) by mouth every 8 (eight) hours as needed for pain.  90 tablet  0  . acyclovir (ZOVIRAX) 400 MG tablet Take 1 tablet (400 mg total) by mouth daily.  30 tablet  3  . dexamethasone (DECADRON) 4 MG tablet Take 10 tablets (40 mg) on days 1 through 4, and days 9 through 12 of chemo. Repeat every 21 days.  80 tablet  3  . ondansetron (ZOFRAN) 8 MG tablet Take 1 tablet two times a day starting the day after chemo for 2 days. Then take 1 tablet two times a day as needed for nausea or vomiting.  30 tablet  1  . prochlorperazine (COMPAZINE) 10 MG tablet Take 1 tablet (10 mg total) by mouth every 6 (six) hours as needed (Nausea or vomiting).  30 tablet  1  . sulfamethoxazole-trimethoprim (BACTRIM DS,SEPTRA DS) 800-160 MG per tablet Take 1 tablet by mouth 3 (three) times a week.  12 tablet  3   Current Facility-Administered Medications  Medication Dose Route Frequency Provider Last Rate Last Dose  . influenza  inactive virus vaccine (FLUZONE/FLUARIX) injection 0.5 mL  0.5 mL Intramuscular Once Jethro Bolus, MD   0.5 mL at 03/13/11 1418    ALLERGIES:   has no known allergies.  REVIEW OF SYSTEMS:  The rest of the 14-point review of system was negative.   Filed Vitals:   03/13/11 1226  BP: 124/77  Pulse: 88  Temp: 98.8 F (37.1 C)   Wt Readings from Last 3 Encounters:  03/13/11 165 lb 3.2 oz (74.934 kg)  03/08/11 166 lb 12.8 oz (75.66 kg)   ECOG Performance status: 1  PHYSICAL EXAMINATION: General: thin-appearing male in no acute distress. Eyes: no scleral icterus.  ENT: There were no oropharyngeal lesions. Neck was without thyromegaly. Lymphatics: Negative cervical, supraclavicular or axillary adenopathy. Respiratory: lungs were clear bilaterally without wheezing or crackles. Cardiovascular: Regular rate and rhythm, S1/S2, without murmur, rub or gallop. There was no pedal edema. GI: abdomen was soft, flat, nontender, nondistended, without organomegaly. Muscoloskeletal: no spinal tenderness of  palpation of vertebral spine. There was discomfort in palpations of anterior lower ribs without any palpable mass. Skin exam was without echymosis, petichae. there was hyperpigmented overall skin changes of the extensor surface of bilateral hands. Neuro exam was nonfocal. Patient was able to get on and off exam table without assistance. There was pain in bilateral hips with lying down.  Gait was normal. Patient was alerted and oriented. Attention was good. Language was appropriate. Mood was normal without depression. Speech was not pressured. Thought content was not tangential.    ASSESSMENT AND PLAN:  1.  IgG lambda multiple myeloma:    DIAGNOSIS:  I discussed with the patient and his in-laws that g of M spike; elevated lambda free light chain, anemia, and bone pain. These are enough to diagnosed with multiple myeloma.  A diagnostic bone a biopsy this morning was to confirm the percentage of plasma cell and FISH panel for myeloma to determine his prognosis.  PROGNOSIS:  I discussed with the patient that this disease is rather responsive to chemotherapy. However the exact prognosis is not determined until the [facility] panel is resulted.  There are rare cases of a myeloma with mutation such as delete 17 mutation  Who have very prognosis despite different forms of therapy.  TREATMENT PLAN:  I discussed with the patient and his in-laws that the initial treatment for multiple myeloma is induction chemotherapy. Given that he is only 51 years old and otherwise in relatively good health, he may be a candidate for autologous bone marrow transplant.  I discussed with him that combination chemotherapy such as [name], Revlimid, dexamethasone has high response rate and deeper response rate than single agents.  Deep response rate may correlate with improved overall survival.  I discussed with him chemotherapy regimen [name] is given subcutaneously twice weekly, days 1, 4, 8, and 11 of every 3-4 week cycle. Revlimid  25mg  by mouth is given daily already on days 1 through 21 of every 4 week cycle.  Dexamethasone is given 40 mg by mouth weekly.  This chemotherapy regimen has potential response rate up to 80%. We will follow him once a month with serum protein electrophoresis to ascertain response to therapy. After about 3-4 cycles depending on his depth of response he may be a candidate for bone transplant.  SIDE EFFECTS:  This chemotherapy regimen has side effects which include but not limited to fatigue, mucositis, nausea vomiting, cytopenia, infection, bleeding, thrombosis, neuropathy, constipation, skin rash, electrolytes abnormality.    PATIENT EDUCATION:  The patient and his in-laws asked appropriate questions. They expressed informed understanding of the recommendation, the potential benefits, potential side effects.  PREVENTATIVE MEDICATIONS:  To prevent the risk off infections on chemotherapy regimen , I prescribed him Bactrim 3 times a week, acyclovir 400 mg by mouth twice a day. To prevent the risk of Revlimid associated thrombosis, I prescribed Lovenox 40 mg subcutaneous daily.  PLAN:   - Skeletal x-ray to be done tomorrow. - If there is concerning evidence of impending fracture I will refer him to radiation oncology and start Zometa to decrease the risk of skeletal related events.  - We'll initiate paperwork process to start him  on Revlimid. - He was referred to chemotherapy within the next week. - I will see him again in about 2 days to go over the result of bone a biopsy today and answer any last minute questions before starting chemotherapy next week. - I referred him to [facility] for evaluation of whether he is a candidate for autologous bone marrow transplant. - Continue with tramadol 25 mg by mouth every 6-8 hours when necessary pain.   The length of time of the face-to-face encounter was 45  minutes. More than 50% of time was spent counseling and coordination of care.

## 2011-03-14 ENCOUNTER — Telehealth: Payer: Self-pay | Admitting: *Deleted

## 2011-03-14 ENCOUNTER — Ambulatory Visit (HOSPITAL_COMMUNITY)
Admission: RE | Admit: 2011-03-14 | Discharge: 2011-03-14 | Disposition: A | Discharge status: Home / Self Care | Payer: Self-pay | Source: Ambulatory Visit | Attending: Oncology | Admitting: Oncology

## 2011-03-14 DIAGNOSIS — M899 Disorder of bone, unspecified: Secondary | ICD-10-CM | POA: Insufficient documentation

## 2011-03-14 DIAGNOSIS — D649 Anemia, unspecified: Secondary | ICD-10-CM | POA: Insufficient documentation

## 2011-03-14 DIAGNOSIS — M1990 Unspecified osteoarthritis, unspecified site: Secondary | ICD-10-CM

## 2011-03-14 DIAGNOSIS — S22009A Unspecified fracture of unspecified thoracic vertebra, initial encounter for closed fracture: Secondary | ICD-10-CM | POA: Insufficient documentation

## 2011-03-14 DIAGNOSIS — R778 Other specified abnormalities of plasma proteins: Secondary | ICD-10-CM

## 2011-03-14 DIAGNOSIS — X58XXXA Exposure to other specified factors, initial encounter: Secondary | ICD-10-CM | POA: Insufficient documentation

## 2011-03-14 DIAGNOSIS — M545 Low back pain, unspecified: Secondary | ICD-10-CM | POA: Insufficient documentation

## 2011-03-15 ENCOUNTER — Other Ambulatory Visit (HOSPITAL_COMMUNITY): Payer: No Typology Code available for payment source

## 2011-03-15 ENCOUNTER — Other Ambulatory Visit: Payer: Self-pay | Admitting: Oncology

## 2011-03-15 ENCOUNTER — Ambulatory Visit (HOSPITAL_BASED_OUTPATIENT_CLINIC_OR_DEPARTMENT_OTHER): Payer: No Typology Code available for payment source | Admitting: Oncology

## 2011-03-15 ENCOUNTER — Telehealth: Payer: Self-pay | Admitting: Oncology

## 2011-03-15 DIAGNOSIS — C9000 Multiple myeloma not having achieved remission: Secondary | ICD-10-CM

## 2011-03-15 LAB — UIFE/LIGHT CHAINS/TP QN, 24-HR UR
Albumin, U: DETECTED
Beta, Urine: DETECTED — AB
Free Lambda Excretion/Day: 1.26 mg/d
Free Lambda Lt Chains,Ur: 0.07 mg/dL (ref 0.02–0.67)
Free Lt Chn Excr Rate: 14.58 mg/d
Gamma Globulin, Urine: DETECTED — AB
Time: 24 hours
Total Protein, Urine-Ur/day: 25 mg/d (ref 10–140)
Volume, Urine: 1800 mL

## 2011-03-15 LAB — CREATININE CLEARANCE, URINE, 24 HOUR
Collection Interval-CRCL: 24 hours
Creatinine Clearance: 127 mL/min — ABNORMAL HIGH (ref 75–125)
Creatinine, 24H Ur: 1887 mg/d (ref 800–2000)
Urine Total Volume-CRCL: 1800 mL

## 2011-03-15 NOTE — Progress Notes (Signed)
[facility] OFFICE PROGRESS NOTE  Cc:  [name], MD, MD  DIAGNOSIS: IgG lambda multiple myeloma; presented with anemia, and bone pain.  Initial M-spike was 5.1gm/dL; free serum lambda of 6.21 mg/dL;  Ig G 7760 mg/dL; [id number] of 2.47.  Bone marrow biopsy was performed today; result pending including FISH and cytogenetics.    CURRENT THERAPY: due to start on 03/19/11 [name] 1.3mg /m2 SQ d1, 4,8,11 q28 day; Revlimid 25 mg PO d1-21 q28day; Dexamethasone 40mg  PO weekly (even of week off of chemo).  He is also on Acyclovir 400mg  PO BID; Bactrim DS Mon/Wed/Fri; Lovenox 40mg  SQ daily.  Zometa is due to start after he is cleared by dentist.   INTERVAL HISTORY: [name] 51 y.o. 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 His wife couldn't make it to the visit today again because of work. He has diffuse bone pain with some relieve with Tramadol.  He does not have severe focal bone pain.  He still has mild nausea without vomiting; not related to foods.  He has loose stool today without frank diarrhea.  He denies bleeding symptoms, leg paresthesia, leg weakness, bowel/bladder incontinence.   MEDICAL HISTORY: Past Medical History  Diagnosis Date  . Anemia   . Arthritis   . Multiple myeloma     SURGICAL HISTORY: No past surgical history on file.  MEDICATIONS: Current Outpatient Prescriptions  Medication Sig Dispense Refill  . acyclovir (ZOVIRAX) 400 MG tablet Take 1 tablet (400 mg total) by mouth daily.  30 tablet  3  . dexamethasone (DECADRON) 4 MG tablet Take 10 tablets (40 mg) on days 1 through 4, and days 9 through 12 of chemo. Repeat every 21 days.  80 tablet  3  . enoxaparin (LOVENOX) 40 MG/0.4ML SOLN Inject 0.4 mLs (40 mg total) into the skin daily.  30 Syringe  3  . ondansetron (ZOFRAN) 8 MG tablet Take 1 tablet two times a day starting the day after chemo for 2 days. Then take 1 tablet two times a day  as needed for nausea or vomiting.  30 tablet  1  . prochlorperazine (COMPAZINE) 10 MG tablet Take 1 tablet (10 mg total) by mouth every 6 (six) hours as needed (Nausea or vomiting).  30 tablet  1  . sulfamethoxazole-trimethoprim (BACTRIM DS,SEPTRA DS) 800-160 MG per tablet Take 1 tablet by mouth 3 (three) times a week.  12 tablet  3  . traMADol (ULTRAM) 50 MG tablet Take 1 tablet (50 mg total) by mouth every 8 (eight) hours as needed for pain.  90 tablet  0    ALLERGIES:   has no known allergies.  REVIEW OF SYSTEMS:  The rest of the 14-point review of system was negative.   There were no vitals filed for this visit. Wt Readings from Last 3 Encounters:  03/13/11 165 lb 3.2 oz (74.934 kg)  03/08/11 166 lb 12.8 oz (75.66 kg)   ECOG Performance status: 1  PHYSICAL EXAMINATION:  Deferred as no change from 2 days ago.  He was able to ambulate with normal gait.  There was no respiratory distress or pedal edema.    ASSESSMENT AND PLAN:  1.  IgG lambda multiple myeloma:    DIAGNOSIS:  BM biopsy (prelim) confirmed around 30% plasma cell. Thus, he is confirmed to have IgG lambda multiple myeloma.  Cytogenetics still pending at this time for further prognostication.     TREATMENT PLAN:  I reviewed with the patient  and his mother-in-law and [place] translator  that the initial treatment for multiple myeloma is induction chemotherapy. Given that he is only 51 years old and otherwise in relatively good health, I referred him to [facility] BMT program to be evaluated for the role of autologous bone marrow transplant.  I reviewed them to discuss x-ray did show evidence of osteopenia and possible early lytic lesion however no sign of impending fracture.  The diffuse bone pain that he experiencing right now it is due to multiple myeloma.   I reviewed with them that combination chemotherapy such as [name], Revlimid, dexamethasone has high response rate and deeper response rate than single agents.  Deep  response rate may correlate with improved overall survival.  I discussed with him chemotherapy regimen [name] 1.3mg /m2 is given subcutaneously twice weekly, days 1, 4, 8, and 11 of every 3-4 week cycle. Revlimid 25mg  by mouth is given daily already on days 1 through 21 of every 4 week cycle.  Dexamethasone is given 40 mg by mouth weekly even on the week off of chemotherapy. This chemotherapy regimen has side effects which include but not limited to fatigue, mucositis, nausea vomiting, cytopenia, infection, bleeding, thrombosis, neuropathy, constipation, skin rash, electrolytes abnormality.    I recommended the use of Zometa in patients with known bone involvement of multiple myeloma to decrease the risk of skeletal-related events.  He has very poor dental condition and he has not seen a dentist for the past 10 years.  I therefore recommended him to see Dr. Kristin Bruins within the next week or 2 to make sure that any dental extraction is done now prior to the use of Zometa to decrease her risk of osteonecrosis of the jaw.  I review with him the medications to prevent the risk off infections on chemotherapy regimen with Bactrim 3 times a week, acyclovir 400 mg by mouth twice a day. To prevent the risk of Revlimid associated thrombosis, I discussed the role of Lovenox 40 mg subcutaneous daily.  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 - I will see him on 03/26/2011 on the day comes back for day 8 cycle 1 chemotherapy. - Zometa will be delayed for about month from now.  The length of time of the face-to-face encounter was 30 minutes. More than 50% of time was spent counseling and coordination of care.

## 2011-03-15 NOTE — Telephone Encounter (Signed)
Gave pt appt for January, Feb and March. Talked to [facility] @ Dr. Kristin Bruins office. Doris will call pt. Also called Luanna for interpreter.

## 2011-03-19 ENCOUNTER — Encounter: Payer: Self-pay | Admitting: Oncology

## 2011-03-19 ENCOUNTER — Ambulatory Visit: Payer: No Typology Code available for payment source

## 2011-03-19 ENCOUNTER — Ambulatory Visit (HOSPITAL_BASED_OUTPATIENT_CLINIC_OR_DEPARTMENT_OTHER): Payer: No Typology Code available for payment source

## 2011-03-19 ENCOUNTER — Other Ambulatory Visit: Payer: Self-pay | Admitting: *Deleted

## 2011-03-19 DIAGNOSIS — C9000 Multiple myeloma not having achieved remission: Secondary | ICD-10-CM

## 2011-03-19 DIAGNOSIS — Z5112 Encounter for antineoplastic immunotherapy: Secondary | ICD-10-CM

## 2011-03-19 MED ORDER — OXYCODONE HCL 5 MG PO TABS: 5.0000 mg | ORAL_TABLET | ORAL | Status: DC | PRN

## 2011-03-19 MED ORDER — ONDANSETRON HCL 8 MG PO TABS
8.0000 mg | ORAL_TABLET | Freq: Once | ORAL | Status: AC
  Administered 2011-03-19: 8 mg via ORAL

## 2011-03-19 MED ORDER — OXYCODONE-ACETAMINOPHEN 5-325 MG PO TABS
2.0000 | ORAL_TABLET | ORAL | Status: AC
  Administered 2011-03-19: 2 via ORAL

## 2011-03-19 MED ORDER — OXYCODONE-ACETAMINOPHEN 5-325 MG PO TABS: 1.0000 | ORAL_TABLET | ORAL | Status: DC | PRN

## 2011-03-19 MED ORDER — BORTEZOMIB CHEMO SQ INJECTION 3.5 MG (2.5MG/ML)
1.3000 mg/m2 | Freq: Once | INTRAMUSCULAR | Status: AC
  Administered 2011-03-19: 2.5 mg via SUBCUTANEOUS
  Filled 2011-03-19: qty 2.5

## 2011-03-19 NOTE — Progress Notes (Signed)
Patient approved for 100% discount, for family of 1, patient income [phone]

## 2011-03-19 NOTE — Progress Notes (Signed)
Patient has been approved to receive free lovenox through [employer] until 03/15/12.

## 2011-03-19 NOTE — Patient Instructions (Signed)
Pt with first dose [name] treatment, denies problems or concerns, tolerated well. Montiored patient x 30 min then d.c home ambulatory.

## 2011-03-20 ENCOUNTER — Other Ambulatory Visit: Payer: Self-pay | Admitting: *Deleted

## 2011-03-20 ENCOUNTER — Telehealth: Payer: Self-pay | Admitting: Oncology

## 2011-03-20 ENCOUNTER — Other Ambulatory Visit: Payer: Self-pay | Admitting: Certified Registered Nurse Anesthetist

## 2011-03-20 MED ORDER — LENALIDOMIDE 25 MG PO CAPS: 25.0000 mg | ORAL_CAPSULE | Freq: Every day | ORAL | Status: DC

## 2011-03-20 NOTE — Telephone Encounter (Signed)
Pt appt. With Dr. Greggory Stallion @ [place] is 04/23/11 @ 1:00. Faxed medical records. Scans and slides will be fedex'ed. Pt is aware by translator.

## 2011-03-20 NOTE — Telephone Encounter (Signed)
Rec'd fax from [employer] notifying of Free Drug Approval for Revlimid for six months, expires on 09/13/11.  Faxed Rx for Revlimid to [employer] at fax #[phone].

## 2011-03-21 NOTE — Progress Notes (Unsigned)
Patient received Revlimid in mail today; to begin 21 day cycle today (03/21/11) per Dr. Gaylyn Rong.

## 2011-03-22 ENCOUNTER — Encounter (HOSPITAL_COMMUNITY): Payer: Self-pay | Admitting: Dentistry

## 2011-03-22 ENCOUNTER — Encounter: Payer: Self-pay | Admitting: *Deleted

## 2011-03-22 ENCOUNTER — Ambulatory Visit (HOSPITAL_BASED_OUTPATIENT_CLINIC_OR_DEPARTMENT_OTHER): Payer: Self-pay

## 2011-03-22 ENCOUNTER — Ambulatory Visit (HOSPITAL_COMMUNITY): Payer: Self-pay | Admitting: Dentistry

## 2011-03-22 ENCOUNTER — Other Ambulatory Visit: Payer: Self-pay | Admitting: Oncology

## 2011-03-22 ENCOUNTER — Encounter: Payer: Self-pay | Admitting: Oncology

## 2011-03-22 DIAGNOSIS — K0889 Other specified disorders of teeth and supporting structures: Secondary | ICD-10-CM

## 2011-03-22 DIAGNOSIS — K045 Chronic apical periodontitis: Secondary | ICD-10-CM

## 2011-03-22 DIAGNOSIS — M264 Malocclusion, unspecified: Secondary | ICD-10-CM

## 2011-03-22 DIAGNOSIS — K036 Deposits [accretions] on teeth: Secondary | ICD-10-CM

## 2011-03-22 DIAGNOSIS — C9000 Multiple myeloma not having achieved remission: Secondary | ICD-10-CM

## 2011-03-22 DIAGNOSIS — K0530 Chronic periodontitis, unspecified: Secondary | ICD-10-CM

## 2011-03-22 DIAGNOSIS — Z5111 Encounter for antineoplastic chemotherapy: Secondary | ICD-10-CM

## 2011-03-22 MED ORDER — ONDANSETRON HCL 8 MG PO TABS
8.0000 mg | ORAL_TABLET | Freq: Once | ORAL | Status: AC
  Administered 2011-03-22: 8 mg via ORAL

## 2011-03-22 MED ORDER — MORPHINE SULFATE CR 15 MG PO TB12: 15.0000 mg | ORAL_TABLET | Freq: Two times a day (BID) | ORAL | Status: DC

## 2011-03-22 MED ORDER — OXYCODONE-ACETAMINOPHEN 5-325 MG PO TABS: 1.0000 | ORAL_TABLET | ORAL | Status: DC | PRN

## 2011-03-22 MED ORDER — BORTEZOMIB CHEMO SQ INJECTION 3.5 MG (2.5MG/ML)
1.3000 mg/m2 | Freq: Once | INTRAMUSCULAR | Status: AC
  Administered 2011-03-22: 2.5 mg via SUBCUTANEOUS
  Filled 2011-03-22: qty 2.5

## 2011-03-22 NOTE — H&P (Deleted)
Please see Dr. Lodema Pilot

## 2011-03-22 NOTE — H&P (Signed)
  Please see Dr. Lodema Pilot note dated 03/08/2011 to use as H&P note for dental OR. Dr. Kristin Bruins

## 2011-03-22 NOTE — Progress Notes (Signed)
Pt requests Hospital Bed at home d/t increase in bone pain in back w/ a lot of difficulty getting up and down from a regular bed and also increased difficulty in going up and down stairs.  Pt's bedroom is upstairs.  Called [facility] w/ order for Hospital Bed per Dr. Gaylyn Rong and informed them pt is indigent w/o any insurance.  They will evaluate pt to see if he qualifies for their indigent program in getting a hospital bed at home.   Faxed Order and pt's information to [facility] intake at [phone].

## 2011-03-22 NOTE — Progress Notes (Signed)
DENTAL CONSULTATION  Date of Consultation:  03/22/2011 Patient Name:   Shane Woodard Date of Birth:   1961/02/14 Medical Record Number: [id number]  VITALS: BP 126/66  Pulse 91  Temp 97 F (36.1 C)   CHIEF COMPLAINT: Patient needs a pre-chemotherapy and pre-Zometa therapy dental evaluation.  HPI: Shane Woodard is a 51 year old [place] male referred by Dr. Jethro Bolus for a dental consultation . Patient recently diagnosed with multiple myeloma and is currently undergoing active chemotherapy started on 03/19/2011. Patient with anticipated Zometa therapy to start in approximately one to month as indicated. Patient is now seen to rule out dental infection that may affect the patient's systemic health while undergoing active chemotherapy as well as to prevent future complications of osteonecrosis of the jaw related to the anticipated Zometa therapy.  Patient with a history of intermittent dental pain associated with the upper right quadrant (molar tooth #2). Patient describes the pain as being dull and achy in nature. Patient indicates the pain lasts for only minutes.  Patient indicates it's been hurting off-and-on for the past several months. Patient indicates that he has not seen a dentist and over 10-20 years. Patient has no regular primary dentist. Patient denies presence of partial dentures.  Patient does indicate that he has a" bottom loose tooth that is very, very loose" .  PMH: Past Medical History  Diagnosis Date  . Anemia   . Arthritis   . Multiple myeloma    History reviewed. No pertinent past surgical history.  ALLERGIES: No Known Allergies  MEDICATIONS: Current Outpatient Prescriptions  Medication Sig Dispense Refill  . acyclovir (ZOVIRAX) 400 MG tablet Take 1 tablet (400 mg total) by mouth daily.  30 tablet  3  . dexamethasone (DECADRON) 4 MG tablet Take 10 tablets (40 mg) on days 1 through 4, and days 9 through 12 of chemo. Repeat every 21 days.  80 tablet  3  . enoxaparin (LOVENOX)  40 MG/0.4ML SOLN Inject 0.4 mLs (40 mg total) into the skin daily.  30 Syringe  3  . lenalidomide (REVLIMID) 25 MG capsule Take 25 mg by mouth daily. Starting 03/21/11      . ondansetron (ZOFRAN) 8 MG tablet Take 1 tablet two times a day starting the day after chemo for 2 days. Then take 1 tablet two times a day as needed for nausea or vomiting.  30 tablet  1  . oxyCODONE (OXY IR/ROXICODONE) 5 MG immediate release tablet Take 1 tablet (5 mg total) by mouth every 4 (four) hours as needed for pain.  30 tablet  0  . oxyCODONE-acetaminophen (PERCOCET) 5-325 MG per tablet Take 1-2 tablets by mouth every 4 (four) hours as needed for pain.  30 tablet  0  . prochlorperazine (COMPAZINE) 10 MG tablet Take 1 tablet (10 mg total) by mouth every 6 (six) hours as needed (Nausea or vomiting).  30 tablet  1  . sulfamethoxazole-trimethoprim (BACTRIM DS,SEPTRA DS) 800-160 MG per tablet Take 1 tablet by mouth 3 (three) times a week.  12 tablet  3    LABS: Lab Results  Component Value Date   WBC 4.0 03/08/2011   HGB 9.7* 03/08/2011   HCT 31.4* 03/08/2011   MCV 84.4 03/08/2011   PLT 244 03/08/2011      Component Value Date/Time   NA 133* 03/08/2011 1105   K 4.0 03/08/2011 1105   CL 104 03/08/2011 1105   CO2 24 03/08/2011 1105   GLUCOSE 69* 03/08/2011 1105   BUN 12 03/08/2011  1105   CREATININE 1.03 03/13/2011 0812   CREATININE 1.03 03/08/2011 1105   CALCIUM 8.0* 03/08/2011 1105   GFRNONAA >60 10/15/2008 0502   GFRAA  Value: >60        The eGFR has been calculated using the MDRD equation. This calculation has not been validated in all clinical situations. eGFR's persistently <60 mL/min signify possible Chronic Kidney Disease. 10/15/2008 0502     SOCIAL HISTORY: History   Social History  . Marital Status: Married    Spouse Name: N/A    Number of Children: 2  . Years of Education: N/A   Occupational History  .      [employer]   Social History Main Topics  . Smoking status: Former Smoker -- 0.5 packs/day for 30  years    Quit date: 02/27/2007  . Smokeless tobacco: Never Used  . Alcohol Use: 1.2 oz/week    2 Cans of beer per week     Indicates he stopped drinking recently  . Drug Use: No  . Sexually Active: Yes    Birth Control/ Protection: None   Other Topics Concern  . Not on file   Social History Narrative  . No narrative on file  Patient is married with 2 children. Patient moved to the [place] in approximately 1983 by patient report. Patient with a history of smoking one half pack per day for approximately 30 years ago. Patient indicted that he did quit smoking in 2009,  but now has started smoking more recently. Patient indicates that he used to drink approximately 2 beers per week but has not had alcoholic beverages for" a while".  FAMILY HISTORY: Patient indicates that his mother and father have both passed away. Mother passed away at the age of 87 from" old age" for. Father died in his 27s of" old age" also.   REVIEW OF SYSTEMS: Reviewed from Dr. Lodema Pilot note of 03/08/11.  DENTAL HISTORY: CHIEF COMPLAINT: Patient needs a pre-chemotherapy and pre-Zometa therapy dental evaluation.  HPI: Shane Woodard is a 51 year old [place] male referred by Dr. Jethro Bolus for a dental consultation . Patient recently diagnosed with multiple myeloma and is currently undergoing active chemotherapy started on 03/19/2011. Patient with anticipated Zometa therapy to start in approximately one to month as indicated. Patient is now seen to rule out dental infection that may affect the patient's systemic health while undergoing active chemotherapy as well as to prevent future complications of osteonecrosis of the jaw related to the anticipated Zometa therapy.  Patient with a history of intermittent dental pain associated with the upper right quadrant (molar tooth #2). Patient describes the pain as being dull and achy in nature. Patient indicates the pain lasts for only minutes.  Patient indicates it's been hurting  off-and-on for the past several months. Patient indicates that he has not seen a dentist and over 10-20 years. Patient has no regular primary dentist. Patient denies presence of partial dentures. Patient does indicate that he has a" bottom loose tooth that is very, very loose" .   DENTAL EXAMINATION:  GENERAL:Patient is a well-developed, well-nourished male in no acute distress.  HEAD AND NECK: There is no obvious submandibular lymphadenopathy. The patient denies acute TMJ symptoms.  INTRAORAL EXAM: Patient has incipient xerostomia. There is no evidence of abscess formation within the mouth. DENTITION: Patient is missing tooth numbers 1, 16, 18, 19, 29, and 30.  PERIODONTAL: Patient has chronic advanced periodontal disease with plaque and calculus accumulations, generalized gingival recession and  extensive tooth mobility. DENTAL CARIES: There are no obvious dental caries noted at this time. ENDODONTIC: Patient with a history of intermittent acute pulpitis symptoms coming from the upper right molar (tooth #2) . Patient with periapical pathology and the areas of tooth numbers 2, 18, and 31.  CROWN AND BRIDGE: There are no crown or bridge restorations. PROSTHODONTIC: There are no partial dentures. OCCLUSION: Patient with a poor occlusal scheme secondary to multiple missing teeth, supra-eruption and drifting of the unopposed teeth into the edentulous areas, and lack of replacement of the missing teeth with dental prostheses.  RADIOGRAPHIC INTERPRETATION: A panoramic radiograph and full series of dental radiographs were obtained. There are multiple missing teeth. There is moderate to severe bone loss. There are multiple areas of periapical pathology and radiolucency. There is radiographic calculus noted.   ASSESSMENTS: 1. Chronic periodontitis with bone loss  2. Tooth mobility 3. Plaque and calculus accumulations 4. Generalized gingival recession 5. Multiple missing teeth 6. Supra-eruption and  drifting of the unopposed teeth into the edentulous areas. 7. No history of partial dentures 8. Poor occlusal scheme 9. Current chemotherapy with the risk for infection and bleeding with invasive dental procedures 10. Anticipated Zometa therapy with the risk for osteonecrosis of the jaw with invasive dental procedures.     PLAN/RECOMMENDATIONS: 1. I discussed the risks, benefits, and complications of various treatment options with the patient in relationship to their medical and dental conditions and anticipated Zometa therapy. We discussed various treatment options to include no treatment, multiple extractions with alveoloplasty, pre-prosthetic surgery as indicated, periodontal therapy, dental restorations, root canal therapy, crown and bridge therapy, implant therapy, and replacement of teeth as indicated. The patient currently wishes to proceed with multiple extractions with alveoloplasty and pre-prosthetic surgery as indicated in the operating room. This is been scheduled for Wednesday, 03/28/2011 at 8:30 AM in the [facility] operating room. This is pending clearance from Dr. Gaylyn Rong after his evaluation of the patient on Monday, 03/26/2011. 2 . Discussion of findings with Dr. Gaylyn Rong and coordination of operating procedures as indicated. Ideally the Zometa therapy would be withheld to allow initial healing from the dental extractions for an additional 2 months. However, this decision will be made by Dr. Gaylyn Rong as per his clinical discretion.    Charlynne Pander, DDS

## 2011-03-22 NOTE — Progress Notes (Signed)
Received #90 Lovenox injections 40mg  from [employer] and gave to pt in infusion room.  Supplied pt w/ Lovenox teaching kit and instructed on giving self injections daily.  Pt demonstrated understanding of giving Lovenox injection.  He gave himself [place] injection into abd correctly while this RN observed.   Mother in law and Interpreter present.

## 2011-03-23 ENCOUNTER — Ambulatory Visit: Payer: No Typology Code available for payment source | Admitting: Oncology

## 2011-03-23 ENCOUNTER — Encounter: Payer: Self-pay | Admitting: Oncology

## 2011-03-23 ENCOUNTER — Encounter (HOSPITAL_COMMUNITY): Payer: Self-pay

## 2011-03-23 NOTE — Progress Notes (Signed)
I saw pt after he received his [name] today.  He has been having severe lower back pain off to the side of his vetebra.  Pain is worse with getting in and out of bed. He denies leg weakness, paresthesia, bowel/bladder incontinence.    His pain was relieved somewhat with Oxycodone/APAP but he needs 2 tabs q4hours and is running out.  He wanted more pain med.  I added MS contin 15mg  PO BID and reserve Oxocodone/APAP one tab q4 hours for breakthrough pain only.  I advised him on bowel regimen.  Since he now has severe focal pain, I requested a MRI lumbar/pelvis ASAP to rule out cord compression or impending fracture.

## 2011-03-23 NOTE — Progress Notes (Signed)
Patient received eight prescription from [facility] on 03/19/11 $42.85,his remaning balance [facility] $357.15.

## 2011-03-26 ENCOUNTER — Ambulatory Visit: Payer: No Typology Code available for payment source | Admitting: Oncology

## 2011-03-26 ENCOUNTER — Other Ambulatory Visit: Payer: No Typology Code available for payment source | Admitting: Lab

## 2011-03-26 ENCOUNTER — Ambulatory Visit: Payer: No Typology Code available for payment source

## 2011-03-26 ENCOUNTER — Encounter (HOSPITAL_COMMUNITY): Payer: Self-pay | Admitting: *Deleted

## 2011-03-26 ENCOUNTER — Ambulatory Visit
Admit: 2011-03-26 | Discharge: 2011-03-26 | Disposition: A | Discharge status: Home / Self Care | Payer: Medicaid Other | Attending: Radiation Oncology | Admitting: Radiation Oncology

## 2011-03-26 ENCOUNTER — Emergency Department (HOSPITAL_COMMUNITY): Payer: Medicaid Other

## 2011-03-26 ENCOUNTER — Inpatient Hospital Stay (HOSPITAL_COMMUNITY)
Admission: EM | Admit: 2011-03-26 | Discharge: 2011-04-04 | DRG: 823 | Disposition: A | Discharge status: Home / Self Care | Payer: Medicaid Other | Attending: Oncology | Admitting: Oncology

## 2011-03-26 DIAGNOSIS — E669 Obesity, unspecified: Secondary | ICD-10-CM | POA: Diagnosis present

## 2011-03-26 DIAGNOSIS — M278 Other specified diseases of jaws: Secondary | ICD-10-CM | POA: Diagnosis present

## 2011-03-26 DIAGNOSIS — Z79899 Other long term (current) drug therapy: Secondary | ICD-10-CM

## 2011-03-26 DIAGNOSIS — D649 Anemia, unspecified: Secondary | ICD-10-CM | POA: Diagnosis present

## 2011-03-26 DIAGNOSIS — E871 Hypo-osmolality and hyponatremia: Secondary | ICD-10-CM | POA: Diagnosis present

## 2011-03-26 DIAGNOSIS — R5381 Other malaise: Secondary | ICD-10-CM | POA: Diagnosis present

## 2011-03-26 DIAGNOSIS — K5900 Constipation, unspecified: Secondary | ICD-10-CM

## 2011-03-26 DIAGNOSIS — K0530 Chronic periodontitis, unspecified: Secondary | ICD-10-CM | POA: Diagnosis present

## 2011-03-26 DIAGNOSIS — D6959 Other secondary thrombocytopenia: Secondary | ICD-10-CM | POA: Diagnosis not present

## 2011-03-26 DIAGNOSIS — R52 Pain, unspecified: Secondary | ICD-10-CM

## 2011-03-26 DIAGNOSIS — R6520 Severe sepsis without septic shock: Secondary | ICD-10-CM | POA: Diagnosis not present

## 2011-03-26 DIAGNOSIS — E876 Hypokalemia: Secondary | ICD-10-CM

## 2011-03-26 DIAGNOSIS — C9000 Multiple myeloma not having achieved remission: Principal | ICD-10-CM | POA: Diagnosis present

## 2011-03-26 DIAGNOSIS — K045 Chronic apical periodontitis: Secondary | ICD-10-CM | POA: Diagnosis present

## 2011-03-26 DIAGNOSIS — S32000A Wedge compression fracture of unspecified lumbar vertebra, initial encounter for closed fracture: Secondary | ICD-10-CM

## 2011-03-26 DIAGNOSIS — M129 Arthropathy, unspecified: Secondary | ICD-10-CM | POA: Diagnosis present

## 2011-03-26 DIAGNOSIS — Y838 Other surgical procedures as the cause of abnormal reaction of the patient, or of later complication, without mention of misadventure at the time of the procedure: Secondary | ICD-10-CM | POA: Diagnosis not present

## 2011-03-26 DIAGNOSIS — IMO0002 Reserved for concepts with insufficient information to code with codable children: Secondary | ICD-10-CM

## 2011-03-26 DIAGNOSIS — T4275XA Adverse effect of unspecified antiepileptic and sedative-hypnotic drugs, initial encounter: Secondary | ICD-10-CM | POA: Diagnosis not present

## 2011-03-26 DIAGNOSIS — J189 Pneumonia, unspecified organism: Secondary | ICD-10-CM | POA: Diagnosis not present

## 2011-03-26 DIAGNOSIS — J954 Chemical pneumonitis due to anesthesia: Secondary | ICD-10-CM | POA: Diagnosis not present

## 2011-03-26 DIAGNOSIS — D630 Anemia in neoplastic disease: Secondary | ICD-10-CM | POA: Diagnosis present

## 2011-03-26 DIAGNOSIS — Y9210 Unspecified residential institution as the place of occurrence of the external cause: Secondary | ICD-10-CM | POA: Diagnosis not present

## 2011-03-26 DIAGNOSIS — K0889 Other specified disorders of teeth and supporting structures: Secondary | ICD-10-CM | POA: Diagnosis present

## 2011-03-26 DIAGNOSIS — M549 Dorsalgia, unspecified: Secondary | ICD-10-CM

## 2011-03-26 DIAGNOSIS — Z299 Encounter for prophylactic measures, unspecified: Secondary | ICD-10-CM

## 2011-03-26 DIAGNOSIS — R030 Elevated blood-pressure reading, without diagnosis of hypertension: Secondary | ICD-10-CM | POA: Diagnosis present

## 2011-03-26 DIAGNOSIS — T8112XA Postprocedural septic shock, initial encounter: Secondary | ICD-10-CM | POA: Diagnosis not present

## 2011-03-26 DIAGNOSIS — A419 Sepsis, unspecified organism: Secondary | ICD-10-CM | POA: Diagnosis not present

## 2011-03-26 DIAGNOSIS — T8140XA Infection following a procedure, unspecified, initial encounter: Secondary | ICD-10-CM | POA: Diagnosis not present

## 2011-03-26 DIAGNOSIS — M1990 Unspecified osteoarthritis, unspecified site: Secondary | ICD-10-CM

## 2011-03-26 DIAGNOSIS — R21 Rash and other nonspecific skin eruption: Secondary | ICD-10-CM | POA: Diagnosis present

## 2011-03-26 DIAGNOSIS — M8448XA Pathological fracture, other site, initial encounter for fracture: Secondary | ICD-10-CM | POA: Diagnosis present

## 2011-03-26 DIAGNOSIS — Z23 Encounter for immunization: Secondary | ICD-10-CM

## 2011-03-26 DIAGNOSIS — F17200 Nicotine dependence, unspecified, uncomplicated: Secondary | ICD-10-CM | POA: Diagnosis present

## 2011-03-26 DIAGNOSIS — D6481 Anemia due to antineoplastic chemotherapy: Secondary | ICD-10-CM | POA: Diagnosis present

## 2011-03-26 LAB — CBC
HCT: 30.2 % — ABNORMAL LOW (ref 39.0–52.0)
MCHC: 31.8 g/dL (ref 30.0–36.0)
MCV: 83 fL (ref 78.0–100.0)
Platelets: 213 10*3/uL (ref 150–400)
RDW: 16.8 % — ABNORMAL HIGH (ref 11.5–15.5)
WBC: 2.8 10*3/uL — ABNORMAL LOW (ref 4.0–10.5)

## 2011-03-26 LAB — BASIC METABOLIC PANEL
BUN: 15 mg/dL (ref 6–23)
Glucose, Bld: 105 mg/dL — ABNORMAL HIGH (ref 70–99)
Sodium: 133 mEq/L — ABNORMAL LOW (ref 135–145)

## 2011-03-26 LAB — DIFFERENTIAL
Basophils Absolute: 0 10*3/uL (ref 0.0–0.1)
Basophils Relative: 0 % (ref 0–1)
Eosinophils Relative: 1 % (ref 0–5)
Lymphocytes Relative: 27 % (ref 12–46)
Monocytes Absolute: 0 10*3/uL — ABNORMAL LOW (ref 0.1–1.0)

## 2011-03-26 MED ORDER — LENALIDOMIDE 25 MG PO CAPS: 25.0000 mg | ORAL_CAPSULE | Freq: Every day | ORAL | Status: DC

## 2011-03-26 MED ORDER — ACYCLOVIR 400 MG PO TABS
400.0000 mg | ORAL_TABLET | Freq: Every day | ORAL | Status: DC
  Administered 2011-03-26 – 2011-04-04 (×10): 400 mg via ORAL
  Filled 2011-03-26 (×10): qty 1

## 2011-03-26 MED ORDER — ONDANSETRON HCL 8 MG PO TABS
8.0000 mg | ORAL_TABLET | Freq: Once | ORAL | Status: AC
  Administered 2011-03-26: 8 mg via ORAL
  Filled 2011-03-26: qty 1

## 2011-03-26 MED ORDER — LENALIDOMIDE 15 MG PO CAPS: 25.0000 mg | ORAL_CAPSULE | Freq: Every day | ORAL | Status: DC

## 2011-03-26 MED ORDER — FENTANYL 25 MCG/HR TD PT72
25.0000 ug | MEDICATED_PATCH | TRANSDERMAL | Status: DC
Start: 2011-03-26 — End: 2011-03-29
  Administered 2011-03-26: 25 ug via TRANSDERMAL
  Filled 2011-03-26: qty 1

## 2011-03-26 MED ORDER — GADOBENATE DIMEGLUMINE 529 MG/ML IV SOLN
15.0000 mL | Freq: Once | INTRAVENOUS | Status: AC | PRN
  Administered 2011-03-26: 15 mL via INTRAVENOUS

## 2011-03-26 MED ORDER — HYDROMORPHONE HCL PF 1 MG/ML IJ SOLN
1.0000 mg | INTRAMUSCULAR | Status: AC | PRN
  Administered 2011-03-26 – 2011-03-27 (×10): 1 mg via INTRAVENOUS
  Filled 2011-03-26 (×9): qty 1

## 2011-03-26 MED ORDER — SODIUM CHLORIDE 0.9 % IV SOLN
INTRAVENOUS | Status: AC
  Administered 2011-03-26: 11:00:00 via INTRAVENOUS

## 2011-03-26 MED ORDER — POLYETHYLENE GLYCOL 3350 17 G PO PACK
17.0000 g | PACK | Freq: Every day | ORAL | Status: DC | PRN
  Administered 2011-03-26 – 2011-04-02 (×2): 17 g via ORAL
  Filled 2011-03-26 (×2): qty 1

## 2011-03-26 MED ORDER — CYCLOBENZAPRINE HCL 5 MG PO TABS
5.0000 mg | ORAL_TABLET | Freq: Three times a day (TID) | ORAL | Status: DC
  Administered 2011-03-26 – 2011-04-04 (×26): 5 mg via ORAL
  Filled 2011-03-26 (×30): qty 1

## 2011-03-26 MED ORDER — HYDROMORPHONE HCL PF 1 MG/ML IJ SOLN
1.0000 mg | INTRAMUSCULAR | Status: AC | PRN
  Administered 2011-03-26 (×3): 1 mg via INTRAVENOUS
  Filled 2011-03-26 (×3): qty 1

## 2011-03-26 MED ORDER — CEFAZOLIN SODIUM 1-5 GM-% IV SOLN
1.0000 g | Freq: Once | INTRAVENOUS | Status: AC
  Administered 2011-03-27: 1 g via INTRAVENOUS

## 2011-03-26 MED ORDER — ENOXAPARIN SODIUM 40 MG/0.4ML ~~LOC~~ SOLN
40.0000 mg | SUBCUTANEOUS | Status: DC
  Filled 2011-03-26: qty 0.4

## 2011-03-26 MED ORDER — SULFAMETHOXAZOLE-TRIMETHOPRIM 800-160 MG PO TABS
1.0000 | ORAL_TABLET | ORAL | Status: DC
  Administered 2011-03-28 – 2011-03-30 (×2): 1 via ORAL
  Filled 2011-03-26 (×2): qty 1

## 2011-03-26 MED ORDER — BORTEZOMIB CHEMO SQ INJECTION 3.5 MG (2.5MG/ML)
1.3000 mg/m2 | Freq: Once | INTRAMUSCULAR | Status: AC
  Administered 2011-03-26: 2.5 mg via SUBCUTANEOUS
  Filled 2011-03-26: qty 1

## 2011-03-26 MED ORDER — MORPHINE SULFATE CR 15 MG PO TB12
15.0000 mg | ORAL_TABLET | Freq: Two times a day (BID) | ORAL | Status: DC
  Administered 2011-03-26 – 2011-03-28 (×5): 15 mg via ORAL
  Filled 2011-03-26 (×5): qty 1

## 2011-03-26 MED ORDER — DIPHENHYDRAMINE HCL 50 MG/ML IJ SOLN
25.0000 mg | Freq: Once | INTRAMUSCULAR | Status: AC
  Administered 2011-03-26: 25 mg via INTRAVENOUS
  Filled 2011-03-26: qty 1

## 2011-03-26 MED ORDER — DEXAMETHASONE 6 MG PO TABS
40.0000 mg | ORAL_TABLET | ORAL | Status: DC
  Administered 2011-03-26 – 2011-04-02 (×2): 40 mg via ORAL
  Filled 2011-03-26 (×2): qty 1

## 2011-03-26 MED ORDER — LENALIDOMIDE 25 MG PO CAPS
25.0000 mg | ORAL_CAPSULE | Freq: Every day | ORAL | Status: DC
  Administered 2011-03-27 – 2011-03-29 (×3): 25 mg via ORAL
  Filled 2011-03-26: qty 1

## 2011-03-26 NOTE — ED Provider Notes (Cosign Needed Addendum)
Assumed care from Dr. Lynelle Doctor.  Has hx of Multiple Myeloma.  Increased back pain. New compression fxs in lumbar area of plain xr.  Need to check MRI and then reeval pt.  Can go home if MRI does not show cord compression and pain tolerated.   Nicholes Stairs, MD 03/26/11 [phone]  I spoke with the patient and explained the findings.  I also spoke with his family member and explained the findings on the MRI.  They understand.  We will give the patient.  More analgesics, however, there is no indication for admission.  At this time.  The patient has not had any neurological deficits.  In addition, the patient has an appointment at 12:30 in the cancer Center to get blood drawn and then consultation with his oncologist.  I will speak with the oncologist and provide him with the test results.  That we have in the emergency department.  Nicholes Stairs, MD 03/26/11 [phone]  I spoke with Dr. Gaylyn Rong. He rec'd admission for possible xrt. Will consult triad.  Nicholes Stairs, MD 03/26/11 [phone]  Discussed with triad. He will admit.  Nicholes Stairs, MD 03/26/11 [phone]

## 2011-03-26 NOTE — ED Notes (Signed)
ZOX:[id number]<AV> Expected date:<BR> Expected time:<BR> Means of arrival:<BR> Comments:<BR> M60- bone Ca pt requesting evaluation for pain mangement

## 2011-03-26 NOTE — ED Notes (Signed)
Request entered with [employer] forTLSO brace.

## 2011-03-26 NOTE — ED Provider Notes (Signed)
History     CSN: [id number]  Arrival date & time 03/26/11  4098   First MD Initiated Contact with Patient 03/26/11 [phone]      Chief Complaint  Patient presents with  . Back Pain    (Consider location/radiation/quality/duration/timing/severity/associated sxs/prior treatment) HPI The patient has multiple myeloma. This was diagnosed recently in the last few months. He's been having trouble with pain in his lower back. At home he has been taking MS Contin as well as oxycodone. He recently saw his oncologist and they scheduled an outpatient MRI of his lumbar spine. Patient states the pain is in his lower back on both sides. It increases with movement and palpation. Patient denies fevers or abdominal pain. Past Medical History  Diagnosis Date  . Anemia   . Arthritis   . Multiple myeloma     History reviewed. No pertinent past surgical history.  History reviewed. No pertinent family history.  History  Substance Use Topics  . Smoking status: Current Everyday Smoker -- 0.5 packs/day for 30 years    Types: Cigarettes  . Smokeless tobacco: Never Used  . Alcohol Use: No     Indicates he stopped drinking recently      Review of Systems  All other systems reviewed and are negative.    Allergies  Review of patient's allergies indicates no known allergies.  Home Medications   Current Outpatient Rx  Name Route Sig Dispense Refill  . ACYCLOVIR 400 MG PO TABS Oral Take 1 tablet (400 mg total) by mouth daily. 30 tablet 3  . BORTEZOMIB CHEMO IV INJECTION 3.5 MG Intravenous Inject 1.3 mg/m2 into the vein as directed. 2 weeks on & 2 weeks off (patient receives at [facility])    . DEXAMETHASONE 4 MG PO TABS Oral Take 40 mg by mouth every 7 (seven) days. Mondays.    Marland Kitchen ENOXAPARIN SODIUM 40 MG/0.4ML [place] SOLN Subcutaneous Inject 0.4 mLs (40 mg total) into the skin daily. 30 Syringe 3    To decrease the risk of clot with chemotherapy for ...  . LENALIDOMIDE 25 MG PO CAPS Oral Take 25 mg by mouth as  directed. 21 days on and 7 days off (patient began on 03/21/2011)    . MORPHINE SULFATE ER 15 MG PO TB12 Oral Take 1 tablet (15 mg total) by mouth 2 (two) times daily. 60 tablet 0  . ONDANSETRON HCL 8 MG PO TABS  Take 1 tablet two times a day starting the day after chemo for 2 days. Then take 1 tablet two times a day as needed for nausea or vomiting. 30 tablet 1  . OXYCODONE-ACETAMINOPHEN 5-325 MG PO TABS Oral Take 1 tablet by mouth every 4 (four) hours as needed. For breakthrough pain.    Marland Kitchen PROCHLORPERAZINE MALEATE 10 MG PO TABS Oral Take 1 tablet (10 mg total) by mouth every 6 (six) hours as needed (Nausea or vomiting). 30 tablet 1  . SULFAMETHOXAZOLE-TRIMETHOPRIM 800-160 MG PO TABS Oral Take 1 tablet by mouth every Monday, Wednesday, and Friday.      BP 144/82  Pulse 102  Temp(Src) 98.4 F (36.9 C) (Oral)  Resp 18  Ht 5\' 6"  (1.676 m)  Wt 167 lb (75.751 kg)  BMI 26.95 kg/m2  SpO2 93%  Physical Exam  Nursing note and vitals reviewed. Constitutional: He appears well-developed and well-nourished. No distress.  HENT:  Head: Normocephalic and atraumatic.  Right Ear: External ear normal.  Left Ear: External ear normal.  Eyes: Conjunctivae are normal. Right eye exhibits  no discharge. Left eye exhibits no discharge. No scleral icterus.  Neck: Neck supple. No tracheal deviation present.  Cardiovascular: Normal rate, regular rhythm and intact distal pulses.   Pulmonary/Chest: Effort normal and breath sounds normal. No stridor. No respiratory distress. He has no wheezes. He has no rales.  Abdominal: Soft. Bowel sounds are normal. He exhibits no distension. There is no tenderness. There is no rebound and no guarding.  Musculoskeletal: He exhibits no edema and no tenderness.       Lumbar back: He exhibits decreased range of motion, tenderness and bony tenderness. He exhibits no swelling, no edema and no deformity.  Neurological: He is alert. He has normal strength. No sensory deficit. Cranial  nerve deficit:  no gross defecits noted. He exhibits normal muscle tone. He displays no seizure activity. Coordination normal.  Skin: Skin is warm and dry. Rash noted. No bruising and no purpura noted. Rash is papular and urticarial. Rash is not vesicular. He is not diaphoretic. No pallor.  Psychiatric: He has a normal mood and affect.    ED Course  Procedures (including critical care time)  Labs Reviewed  CBC - Abnormal; Notable for the following:    WBC 2.8 (*)    RBC 3.64 (*)    Hemoglobin 9.6 (*)    HCT 30.2 (*)    RDW 16.8 (*)    All other components within normal limits  DIFFERENTIAL - Abnormal; Notable for the following:    Monocytes Relative 1 (*)    Monocytes Absolute 0.0 (*)    All other components within normal limits  BASIC METABOLIC PANEL - Abnormal; Notable for the following:    Sodium 133 (*)    Glucose, Bld 105 (*)    GFR calc non Af Amer 72 (*)    GFR calc Af Amer 83 (*)    All other components within normal limits   Dg Lumbar Spine Complete  03/26/2011  *RADIOLOGY REPORT*  Clinical Data: Low back pain.  History of multiple myeloma.  LUMBAR SPINE - COMPLETE 4+ VIEW  Comparison: None survey 03/14/2011  Findings: Sacralization of L5.  Visualization of the lumbar vertebrae is limited due to bowel gas overlying but the vertebral elements appear demonstrate normal alignment.  Since the previous study, there is interval development of multiple lumbar compression fractures including T11, L1, L2, L3 vertebrae.  There is approximately 810% loss of vertebral height at T11, 30% at L1, 50% of L2, 30% at L3.  Changes are consistent with history of multiple myeloma.  No retropulsion of fracture fragments.  IMPRESSION: Interval development since recent previous study of multiple anterior compression fractures involving T11, L1, L2, and L3 vertebrae.  Original Report Authenticated By: Marlon Pel, M.D.      MDM  Pt with multiple new compression fractures.  Will place pt in a  TLSO brace.  No neurologic symptoms at this time.  Will plan on MRI for further characterization, rule out retropulsion.  Pt is feeling better after pain medications.          Celene Kras, MD 03/26/11 [id number]

## 2011-03-26 NOTE — ED Notes (Signed)
Patient to MRI at this time.

## 2011-03-26 NOTE — ED Notes (Signed)
Pt returned from MRI °

## 2011-03-26 NOTE — ED Notes (Signed)
Breakfast tray ordered for pt. Will be holding pt in ED per [place], EDP [facility]. Will continue to monitor.

## 2011-03-26 NOTE — ED Notes (Signed)
Pt states pain is decreased to a 6/10 at this time. Informed pt of plan of care, awaiting MRI results. Will continue to monitor. No needs or concerns voiced at this time.

## 2011-03-26 NOTE — H&P (Signed)
Dr. Corliss Skains was informed of this patient and MRI reviewed revealing patient would be a candidate for KP/VP. We are awaiting insurance authorization before we can provide this service for the patient. Patient has been made NPO after midnight with the anticipation of having the procedure performed prior to radiation treatment.  Will check on status first thing in am.   I have discussed personally in detail with the patient the procedure details for biopsy,kyphoplasty/vertebroplasty with his apparent understanding. Potential risks including but not limited to infection,bleeding, contrast allergy, complications with moderate sedation and poor pain control were all discussed.  Our hopes are for significant pain reduction, establishing a diagnosis through bone biopsy and the ability to obtain radiation treatment asap as well.  Patient denies radicular symptoms of the lower extremities or bandlike distribution of pain around the abdomen. He denies incontinence.   He is in a full brace upon my exam and appears to be in significant distress with any movement despite pain meds and brace.  Heart and lung exam are unremarkable.  Labs to be drawn in am. Patient made aware we are awaiting preauthorization and that he is tentatively on schedule at [facility] at 1100,to arrive there by 10am if authorization given.  The rest of his medical history was reviewed with the patient- appropriate for procedure labs pending.

## 2011-03-26 NOTE — Progress Notes (Signed)
Patient received SQ [name] without any problems.  Will continue to monitor.  Allayne Butcher [facility]  03/26/2011

## 2011-03-26 NOTE — H&P (Signed)
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  Started to have LBP.  Sleeping anf then started to wake up and had a ngiht mare and then jumped.  The pains actually started in [place] and this was when MM was diagnosed a couple of weeks ago and treatment started on the 03/19/11.  No falls or accidents or other issues to have been thought to cause this.  Has control of the bowel and bladder and can tell when he needs to go.  Pain described as a worsening amount of pain-Thought last Sunday night he might have needed to go to the ED.  Actually felt then like he couldn't walk and was taking Oxycodone 2 tabs q 4hr, and this was changed to MS-contin and oxycodone.  This seemed to control the pain, but decreased it from 8/10-->4/10 Pains like someone is twisting and cracking his back. While trying to walk to go to the rest-room over the weekend, couldn't get back to bed   Review of Systems:  No Ha/blurred vision/double vision, no fever, no dysuria, nop cough, cold.  No weakness in lower extremities..  Found a new rash maybe Friday.  No sick contacts.  Has had a flu shot.  No CP/sob, no weakness, no stroke like sympotms  Past Medical History: Past Medical History  Diagnosis Date  . Anemia   . Arthritis   . Multiple myeloma     Past surgical history: Past Surgical History  Procedure Date  . Left thumb surgery 11/26/2003    Medications: Prior to Admission medications   Medication Sig Start Date End Date Taking? Authorizing Provider  acyclovir (ZOVIRAX) 400 MG tablet Take 1 tablet (400 mg total) by mouth daily. 03/13/11 03/12/12 Yes Jethro Bolus, MD  bortezomib IV ([name]) 3.5 MG injection Inject 1.3 mg/m2 into the vein as directed. 2 weeks on & 2 weeks off  (patient [facility] at [facility])   Yes Historical Provider, MD  dexamethasone (DECADRON) 4 MG tablet Take 40 mg by mouth every 7 (seven) days. Mondays. 03/13/11  Yes Jethro Bolus, MD  enoxaparin (LOVENOX) 40 MG/0.4ML SOLN Inject 0.4 mLs (40 mg total) into the skin daily. 03/13/11  Yes Jethro Bolus, MD  lenalidomide (REVLIMID) 25 MG capsule Take 25 mg by mouth as directed. 21 days on and 7 days off (patient began on 03/21/2011) 03/21/11  Yes Jethro Bolus, MD  morphine (MS CONTIN) 15 MG 12 hr tablet Take 1 tablet (15 mg total) by mouth 2 (two) times daily. 03/22/11 04/21/11 Yes Jethro Bolus, MD  ondansetron (ZOFRAN) 8 MG tablet Take 1 tablet two times a day starting the day after chemo for 2 days. Then take 1 tablet two times a day as needed for nausea or vomiting. 03/13/11 03/12/12 Yes Jethro Bolus, MD  oxyCODONE-acetaminophen (PERCOCET) 5-325 MG per tablet Take 1 tablet by mouth every 4 (four) hours as needed. For breakthrough pain. 03/22/11 04/01/11 Yes Jethro Bolus, MD  prochlorperazine (COMPAZINE) 10 MG tablet Take 1 tablet (10 mg total) by mouth every 6 (six) hours as needed (Nausea or vomiting). 03/13/11 03/12/12 Yes Jethro Bolus, MD  sulfamethoxazole-trimethoprim (BACTRIM DS,SEPTRA DS) 800-160 MG per tablet Take 1 tablet by mouth every Monday, Wednesday, and Friday. 03/13/11 03/12/12 Yes Jethro Bolus, MD    Allergies:  No Known  Allergies  Social History:  reports that he has been smoking Cigarettes.  He has a 15 pack-year smoking history. He has never used smokeless tobacco. He reports that he does not drink alcohol or use illicit drugs.  Family History: History reviewed. No pertinent family history.  Physical Exam: Filed Vitals:   03/26/11 0341 03/26/11 0759 03/26/11 1115 03/26/11 1205  BP: 144/82 126/74 131/73 146/86  Pulse: 102 103 97 99  Temp: 98.4 F (36.9 C) 98.7 F (37.1 C) 98.2 F (36.8 C) 99 F (37.2 C)  TempSrc: Oral Oral Oral Oral  Resp: 18 18 20 18   Height: 5\' 6"  (1.676 m)     Weight: 75.751 kg (167 lb)     SpO2: 93% 90% 93%  91%    HEENT-alert oriented pleasant male soft-spoken. No icterus some pallor throat clear. Poor dentition CHEST-chest clinically clear. Did not set patient up to listen to chest as was the been into much pain abdomen soft nontender nondistended CARDS-S1-S2 no murmurs rubs or gallops ABD-soft nontender SKIN-has a rash on the inner area of the size further rash not dizzy NEURO- grossly normal strength first of his upper extremities reflexes 2 on 3 bilaterally flex strength is within normal limits but does cause significant pain when moving leg so full neuro exam  Labs on Admission:   [facility] 03/26/11 0420  NA 133*  K 3.8  CL 99  CO2 27  GLUCOSE 105*  BUN 15  CREATININE 1.15  CALCIUM 10.2  MG --  PHOS --   No results found for this basename: AST:2,ALT:2,ALKPHOS:2,BILITOT:2,PROT:2,ALBUMIN:2 in the last 72 hours No results found for this basename: LIPASE:2,AMYLASE:2 in the last 72 hours  Basename 03/26/11 0420  WBC 2.8*  NEUTROABS 1.9  HGB 9.6*  HCT 30.2*  MCV 83.0  PLT 213   No results found for this basename: CKTOTAL:3,CKMB:3,CKMBINDEX:3,TROPONINI:3 in the last 72 hours No results found for this basename: TSH,T4TOTAL,FREET3,T3FREE,THYROIDAB in the last 72 hours No results found for this basename: VITAMINB12:2,FOLATE:2,FERRITIN:2,TIBC:2,IRON:2,RETICCTPCT:2 in the last 72 hours  Radiological Exams on Admission: Dg Lumbar Spine Complete  03/26/2011  *RADIOLOGY REPORT*  Clinical Data: Low back pain.  History of multiple myeloma.  LUMBAR SPINE - COMPLETE 4+ VIEW  Comparison: None survey 03/14/2011  Findings: Sacralization of L5.  Visualization of the lumbar vertebrae is limited due to bowel gas overlying but the vertebral elements appear demonstrate normal alignment.  Since the previous study, there is interval development of multiple lumbar compression fractures including T11, L1, L2, L3 vertebrae.  There is approximately 810% loss of vertebral height at T11, 30% at L1, 50% of L2,  30% at L3.  Changes are consistent with history of multiple myeloma.  No retropulsion of fracture fragments.  IMPRESSION: Interval development since recent previous study of multiple anterior compression fractures involving T11, L1, L2, and L3 vertebrae.  Original Report Authenticated By: Marlon Pel, M.D.   Mr Lumbar Spine W Wo Contrast  03/26/2011  *RADIOLOGY REPORT*  Clinical Data: Severe back pain for several days.  History of multiple myeloma.  Compression fractures on radiographs.  MRI LUMBAR SPINE WITHOUT AND WITH CONTRAST  Technique:  Multiplanar and multiecho pulse sequences of the lumbar spine were obtained without and with intravenous contrast.  Contrast: 15mL MULTIHANCE GADOBENATE DIMEGLUMINE 529 MG/ML IV SOLN  Comparison: Lumbar spine radiographs 03/26/2011 and abdominal pelvic CT 03/02/2011.  Findings: As correlated with prior studies, there is transitional lumbosacral anatomy.  There are small ribs at T12 with a nearly fully sacralized L5  segment.  There is a mild convex left scoliosis centered at L2.  The lateral alignment is normal.  Schmorl's nodes and associated endplate edema at G95-A21 are unchanged from the prior CT.  There has been interval development of multiple compression deformities involving the superior endplates of T12, L1, L2 and L3.  The inferior endplate of L1 is also mildly depressed.  There is no osseous retropulsion.  The L1 fracture results in approximately 60% loss of vertebral body height.  There is minimal associated marrow edema and enhancement.  No epidural hematoma, paraspinal enhancing lesion or discrete myelomatous lesions are identified.  The conus medullaris extends to the T12-L1 level and appears normal.  There is no abnormal intradural enhancement.  There are no paraspinal abnormalities.  L1-L2:  Mild disc bulging.  No spinal stenosis or nerve root encroachment.  L2-L3:  Disc height and hydration are maintained.  There is mild facet and ligamentous  hypertrophy.  No spinal stenosis or nerve root encroachment results.  L3-L4:  Mild disc bulge with mild facet and ligamentous hypertrophy.  No spinal stenosis or nerve root encroachment.  L4-L5:  Mild disc bulge with mild facet and ligamentous hypertrophy.  No significant spinal stenosis or nerve root encroachment.  L5-S1:  As numbered, this is a transitional disc space level without abnormality.  IMPRESSION:  1.  Transitional lumbosacral anatomy.  L5 is largely sacralized. 2.  Compression fractures at T12, L1, L2 and L3 have developed since the CT of 3 weeks ago.  Each of these fractures demonstrates only mild edema and marrow enhancement.  No definite pathologic features or associated osseous retropulsion identified. However, if spinal augmentation is performed, marrow biopsy should be considered given the patient's history. 3.  Minimal spondylosis.  Per CMS PQRS reporting requirements (PQRS Measure 24): Given the patient's age of greater than 50 and the fracture site (hip, distal radius, or spine), the patient should be tested for osteoporosis using DXA, and the appropriate treatment considered based on the DXA results.  Original Report Authenticated By: Gerrianne Scale, M.D.   Ct Abdomen Pelvis W Contrast  03/02/2011  *RADIOLOGY REPORT*  Clinical Data: Left abdominal, flank, and left lower quadrant pain.  CT ABDOMEN AND PELVIS WITH CONTRAST  Technique:  Multidetector CT imaging of the abdomen and pelvis was performed following the standard protocol during bolus administration of intravenous contrast.  Contrast: OMNIPAQUE IOHEXOL 300 MG/ML IV SOLN  Comparison: 10/13/2008  Findings: The liver, gallbladder, pancreas, spleen, both adrenal glands, and kidneys are normal in appearance.  No evidence of hydronephrosis.  No soft tissue masses or lymphadenopathy identified within the abdomen or pelvis.  No inflammatory process or abnormal fluid collections are identified within the abdomen or pelvis.  There is  no evidence of bowel wall thickening or dilatation.  Normal appendix is visualized.  IMPRESSION: Negative.  No acute findings or other significant abnormality.  Original Report Authenticated By: Danae Orleans, M.D.   Dg Bone Survey Met  03/14/2011  *RADIOLOGY REPORT*  Clinical Data: Low back pain for 2-3 months. Anemia.  Possible multiple myeloma.  METASTATIC BONE SURVEY  Comparison: CT abdomen and pelvis 03/01/2010.  Findings: A small focal lucency is seen in the left humeral head. On the PA view of the thoracic spine, there appears to be mild vertebral body height loss at T10 eccentric to the right.  A focal lucency is seen in the frontal bone of the skull.  Subtle lucent foci are also seen in the C2, C3, C4  and C5 vertebral bodies.  The axial and appendicular skeleton is otherwise unremarkable.  IMPRESSION: Scattered, subtle lucent lesions of bone or worrisome for myeloma. As noted above, the PA view of the thoracic spine, there appears to be mild compression fracture of T10 eccentric to the right.  Original Report Authenticated By: Bernadene Bell. Maricela Curet, M.D.    Assessment/Plan Patient Active Problem List  Diagnoses  . Anemia  . Arthritis  . Multiple myeloma   1. Multilevel thoracolumbar compression fractures-these are likely secondary to myeloma. Dr. Gwenith Daily aware of the patient and Dr. Gerlene Fee of neurosurgery was consulted by the emergency room. Patient's oncological medications will be held until reviewed by Dr. half. We will off the patient's significant pain control. Patient might benefit from having palliative radiotherapy for the pain. I placed him also on muscle relaxant. Patient does not appear to have any significant evidence is of cauda equina syndrome, and is able to sense when he needs to pass urine and stool. For comfort sake we will get the patient a Foley catheter until seen by Dr. Prince Solian 2. Multiple myeloma with IgG spike-per oncologist. Will repeat CBC in the morning as slightly more  pancytopenic and prior his baseline white count seems to be in the 3-4 range and today was 2.8 3. Anemia this seems stable at present time 4. Mild hypertension-likely secondary to pain  Over 30 minutes minutes spent in time coordinating admission Patient will be on hospitalist service until tomorrow morning and then Dr. Eula Listen will assume his care  [facility] 03/26/2011, 12:48 PM

## 2011-03-26 NOTE — Consult Note (Signed)
[facility] Radiation Oncology NEW PATIENT EVALUATION  Name: Shane Woodard MRN: [id number]  Date: 03/26/2011  DOB: 1960/11/26  Status: inpatient   CC: [name], MD, MD   Jethro Bolus, MD    REFERRING PHYSICIAN: Jethro Bolus, MD   DIAGNOSIS: Multiple myeloma with spinal metastases    HISTORY OF PRESENT ILLNESS:  Shane Woodard is a 51 y.o. male who speaks [place] and limited [place]. A translator was available by phone for part of our consultation. The patient started systemic therapy this month under the care of Dr. Gaylyn Rong. He presented with diffuse bone pain and a small early lytic lesion in the spine.  Unfortunately, after starting systemic therapy, the patient developed acute severe lower back pain. I have reviewed his most recent imaging in the form of plain films as well as MRI imaging of the thoracic lumbar spine. He has acute compression fractures from T12-L3. All 4 of these vertebral bodies show evidence of compression fracture. L5 is largely sacralized. It should be noted that due to the fact that L5 is largely sacralized, his plain film imaging labels the vertebral bodies differently than his MRI imaging.  My understanding is that the patient may be undergoing kyphoplasty tomorrow.  He reports severe pain even when moving around a little bit in bed. He is unable to walk due to the pain. He denies any numbness or weakness in his extremities. He denies any problems with urination or bowel movements.   PAST MEDICAL HISTORY:  has a past medical history of Anemia; Arthritis; and Multiple myeloma.     PAST SURGICAL HISTORY:  Past Surgical History  Procedure Date  . Left thumb surgery 11/26/2003     FAMILY HISTORY: family history is not on file.   SOCIAL HISTORY:  reports that he has been smoking Cigarettes.  He has a 15 pack-year smoking history. He has never used smokeless tobacco. He reports that he does not drink alcohol or use illicit drugs.   ALLERGIES: Review of  patient's allergies indicates no known allergies.   MEDICATIONS:  Current Facility-Administered Medications  Medication Dose Route Frequency Provider Last Rate Last Dose  . 0.9 %  sodium chloride infusion   Intravenous STAT Nicholes Stairs, MD 50 mL/hr at 03/26/11 1050    . acyclovir (ZOVIRAX) tablet 400 mg  400 mg Oral Daily Pleas Koch, MD      . cyclobenzaprine (FLEXERIL) tablet 5 mg  5 mg Oral TID Pleas Koch, MD   5 mg at 03/26/11 1309  . dexamethasone (DECADRON) 40 mg  40 mg Oral Q7 days Pleas Koch, MD      . diphenhydrAMINE (BENADRYL) injection 25 mg  25 mg Intravenous Once Celene Kras, MD   25 mg at 03/26/11 0522  . gadobenate dimeglumine (MULTIHANCE) injection 15 mL  15 mL Intravenous Once PRN Medication Radiologist, MD   15 mL at 03/26/11 0732  . HYDROmorphone (DILAUDID) injection 1 mg  1 mg Intravenous Q30 min PRN Celene Kras, MD   1 mg at 03/26/11 0654  . HYDROmorphone (DILAUDID) injection 1 mg  1 mg Intravenous Q30 min PRN Nicholes Stairs, MD   1 mg at 03/26/11 1332  . lenalidomide (REVLIMID) capsule 30 mg  30 mg Oral Daily Jethro Bolus, MD      . morphine (MS CONTIN) 12 hr tablet 15 mg  15 mg Oral Q12H Nicholes Stairs, MD   15 mg at 03/26/11 1050  . polyethylene glycol (MIRALAX / GLYCOLAX) packet 17  g  17 g Oral Daily PRN Pleas Koch, MD      . sulfamethoxazole-trimethoprim (BACTRIM DS,SEPTRA DS) 800-160 MG per tablet 1 tablet  1 tablet Oral Q M,W,F Pleas Koch, MD      . DISCONTD: enoxaparin (LOVENOX) injection 40 mg  40 mg Subcutaneous Q24H Pleas Koch, MD          REVIEW OF SYSTEMS:  Review of systems is negative for  leg weakness and leg numbness. He denies any difficulty with urination or bowel movements. It is very painful in his back to move around in bed or to walk. He is wearing a brace.   PHYSICAL EXAM:  height is 5\' 6"  (1.676 m) and weight is 167 lb (75.751 kg). His oral temperature is 99 F (37.2 C). His blood pressure is 146/86 and his pulse is 99. His  respiration is 18 and oxygen saturation is 91%.   He is lying in bed. He is wearing a brace around his torso. He is in no acute distress. He has limited [place] skills. Much of our communication is through a Nurse, learning disability. He has poor dentition. His heart is regular in rate and rhythm and his chest is clear to auscultation bilaterally with no rhonchi wheezes or rales; the abdomen is soft nontender and nondistended. His extremities reveal no clubbing  cyanosis or edema. He is able to move his legs bilaterally and shows no sign of focal weakness or numbness.   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 He has shown a development of acute compression fractures at T12 L1-L2 and L3. Hopefully he'll be able to undergo kyphoplasty tomorrow. I requested that Dr. Gaylyn Rong make this referral. I also have requested that if his kyphoplasty occurs, that a biopsy be obtained to prove that his compression fractures are due to  multiple myeloma and not due to osteoporosis.  I explained to the patient that once he has healed from kyphoplasty, radiotherapy will play an important role in decreasing his risk of a local recurrence. I would probably deliver about 20 gray in 10 fractions from T11 through L4.  I would be happy to have the patient be referred back to me once he has healed from kyphoplasty.  At that time I can schedule a simulation for treatment planning and go over consent  form with the patient.  It was a pleasure meeting the patient today. I look forward to participating in the patient's care.

## 2011-03-26 NOTE — Progress Notes (Signed)
Patient due for SQ [name] today.  Patient's WBC level is 2.8 and there is no recent bilirubin level.  Dr. Gaylyn Rong notified and order received to give chemotherapy despite lab values.  Allayne Butcher [facility]  03/26/2011  4:09 PM

## 2011-03-26 NOTE — Plan of Care (Signed)
Problem: Phase I Progression Outcomes Goal: Voiding-avoid urinary catheter unless indicated Outcome: Completed/Met Date Met:  03/26/11 Foley placed per order

## 2011-03-26 NOTE — ED Notes (Signed)
Patient brought by EMS from home with CO ongoing severe back pain that is not being controlled by home medications (morphine, oxycodone, tramadol) at this time. Hx of bone CA x 7mo.

## 2011-03-26 NOTE — Consult Note (Signed)
[facility] INPATIENT PROGRESS NOTE  Name: Shane Woodard      MRN: [id number]    Location: 1313/1313-01  Date: 03/26/2011 Time:3:07 PM  DIAGNOSIS: IgG lambda multiple myeloma; presented with anemia, and bone pain. Initial M-spike was 5.1gm/dL; free serum lambda of 0.86 mg/dL; Ig G 7760 mg/dL; [id number] of 2.47. Bone marrow biopsy was performed today; result pending including FISH and cytogenetics.  CURRENT THERAPY: due to start on 03/19/11 [name] 1.3mg /m2 SQ d1, 4,8,11 q28 day; Revlimid 25 mg PO d1-21 q28day; Dexamethasone 40mg  PO weekly (even of week off of chemo). He is also on Acyclovir 400mg  PO BID; Bactrim DS Mon/Wed/Fri; Lovenox 40mg  SQ daily. Zometa is due to start after he is cleared by dentist.  Subjective: Interval History:Mortimer Colston significantly worsened back pain.  He has been taking MS Contin 55m PO BID and Percocet for breakthrough pain, but the pain is only minimally relieved. Pain is crampy/sharp; worsened with movement in bed or ambulation.  He denies bowel/bladder incontinence.  He does have mild leg weakness bilaterally but no paresthesia.  He has been tolerating chemo Revlimid and [name] relatively well.  He noticed pruritis with MS Contin.    Objective: Vital signs in last 24 hours: Temp:  [98.2 F (36.8 C)-99 F (37.2 C)] 98.2 F (36.8 C) (01/28 1401) Pulse Rate:  [97-103] 99  (01/28 1401) Resp:  [18-20] 20  (01/28 1401) BP: (126-146)/(73-86) 133/82 mmHg (01/28 1401) SpO2:  [90 %-100 %] 90 % (01/28 1401) Weight:  [167 lb (75.751 kg)] 167 lb (75.751 kg) (01/28 0341)    Intake/Output from previous day: 01/27 0701 - 01/28 0700 In: 120 [P.O.:120] Out: -     Intake/Output this shift:     PHYSICAL EXAM:  General: well-nourished in pain.   Eyes: no scleral icterus. ENT: There were no oropharyngeal lesions. Neck was without thyromegaly. Lymphatics: Negative cervical, supraclavicular or axillary adenopathy. Respiratory: lungs were clear bilaterally  without wheezing or crackles. Cardiovascular: Regular rate and rhythm, S1/S2, without murmur, rub or gallop. There was no pedal edema. GI: abdomen was soft, flat, nontender, nondistended, without organomegaly. Muscoloskeletal: severe tenderness to palpation of bilateral flank and lumbar vetebral spine.  Skin exam was without echymosis, petichae. there was hyperpigmented overall skin changes of the extensor surface of bilateral hands.  He was able to lift bilateral legs off of his bed.   Studies/Results: Results for orders placed during the hospital encounter of 03/26/11 (from the past 48 hour(s))  CBC     Status: Abnormal   Collection Time   03/26/11  4:20 AM      Component Value Range Comment   WBC 2.8 (*) 4.0 - 10.5 (K/uL)    RBC 3.64 (*) 4.22 - 5.81 (MIL/uL)    Hemoglobin 9.6 (*) 13.0 - 17.0 (g/dL)    HCT 01.0 (*) 27.2 - 52.0 (%)    MCV 83.0  78.0 - 100.0 (fL)    MCH 26.4  26.0 - 34.0 (pg)    MCHC 31.8  30.0 - 36.0 (g/dL)    RDW 53.6 (*) 64.4 - 15.5 (%)    Platelets 213  150 - 400 (K/uL)   DIFFERENTIAL     Status: Abnormal   Collection Time   03/26/11  4:20 AM      Component Value Range Comment   Neutrophils Relative 70  43 - 77 (%)    Neutro Abs 1.9  1.7 - 7.7 (K/uL)    Lymphocytes Relative 27  12 - 46 (%)  Lymphs Abs 0.8  0.7 - 4.0 (K/uL)    Monocytes Relative 1 (*) 3 - 12 (%)    Monocytes Absolute 0.0 (*) 0.1 - 1.0 (K/uL)    Eosinophils Relative 1  0 - 5 (%)    Eosinophils Absolute 0.0  0.0 - 0.7 (K/uL)    Basophils Relative 0  0 - 1 (%)    Basophils Absolute 0.0  0.0 - 0.1 (K/uL)   BASIC METABOLIC PANEL     Status: Abnormal   Collection Time   03/26/11  4:20 AM      Component Value Range Comment   Sodium 133 (*) 135 - 145 (mEq/L)    Potassium 3.8  3.5 - 5.1 (mEq/L)    Chloride 99  96 - 112 (mEq/L)    CO2 27  19 - 32 (mEq/L)    Glucose, Bld 105 (*) 70 - 99 (mg/dL)    BUN 15  6 - 23 (mg/dL)    Creatinine, Ser 4.40  0.50 - 1.35 (mg/dL)    Calcium 10.2  8.4 - 10.5 (mg/dL)     GFR calc non Af Amer 72 (*) >90 (mL/min)    GFR calc Af Amer 83 (*) >90 (mL/min)    Dg Lumbar Spine Complete  03/26/2011  *RADIOLOGY REPORT*  Clinical Data: Low back pain.  History of multiple myeloma.  LUMBAR SPINE - COMPLETE 4+ VIEW  Comparison: None survey 03/14/2011  Findings: Sacralization of L5.  Visualization of the lumbar vertebrae is limited due to bowel gas overlying but the vertebral elements appear demonstrate normal alignment.  Since the previous study, there is interval development of multiple lumbar compression fractures including T11, L1, L2, L3 vertebrae.  There is approximately 810% loss of vertebral height at T11, 30% at L1, 50% of L2, 30% at L3.  Changes are consistent with history of multiple myeloma.  No retropulsion of fracture fragments.  IMPRESSION: Interval development since recent previous study of multiple anterior compression fractures involving T11, L1, L2, and L3 vertebrae.  Original Report Authenticated By: Marlon Pel, M.D.   Mr Lumbar Spine W Wo Contrast  03/26/2011  *RADIOLOGY REPORT*  Clinical Data: Severe back pain for several days.  History of multiple myeloma.  Compression fractures on radiographs.  MRI LUMBAR SPINE WITHOUT AND WITH CONTRAST  Technique:  Multiplanar and multiecho pulse sequences of the lumbar spine were obtained without and with intravenous contrast.  Contrast: 15mL MULTIHANCE GADOBENATE DIMEGLUMINE 529 MG/ML IV SOLN  Comparison: Lumbar spine radiographs 03/26/2011 and abdominal pelvic CT 03/02/2011.  Findings: As correlated with prior studies, there is transitional lumbosacral anatomy.  There are small ribs at T12 with a nearly fully sacralized L5 segment.  There is a mild convex left scoliosis centered at L2.  The lateral alignment is normal.  Schmorl's nodes and associated endplate edema at V25-D66 are unchanged from the prior CT.  There has been interval development of multiple compression deformities involving the superior endplates of  T12, L1, L2 and L3.  The inferior endplate of L1 is also mildly depressed.  There is no osseous retropulsion.  The L1 fracture results in approximately 60% loss of vertebral body height.  There is minimal associated marrow edema and enhancement.  No epidural hematoma, paraspinal enhancing lesion or discrete myelomatous lesions are identified.  The conus medullaris extends to the T12-L1 level and appears normal.  There is no abnormal intradural enhancement.  There are no paraspinal abnormalities.  L1-L2:  Mild disc bulging.  No spinal stenosis or  nerve root encroachment.  L2-L3:  Disc height and hydration are maintained.  There is mild facet and ligamentous hypertrophy.  No spinal stenosis or nerve root encroachment results.  L3-L4:  Mild disc bulge with mild facet and ligamentous hypertrophy.  No spinal stenosis or nerve root encroachment.  L4-L5:  Mild disc bulge with mild facet and ligamentous hypertrophy.  No significant spinal stenosis or nerve root encroachment.  L5-S1:  As numbered, this is a transitional disc space level without abnormality.  IMPRESSION:  1.  Transitional lumbosacral anatomy.  L5 is largely sacralized. 2.  Compression fractures at T12, L1, L2 and L3 have developed since the CT of 3 weeks ago.  Each of these fractures demonstrates only mild edema and marrow enhancement.  No definite pathologic features or associated osseous retropulsion identified. However, if spinal augmentation is performed, marrow biopsy should be considered given the patient's history. 3.  Minimal spondylosis.  Per CMS PQRS reporting requirements (PQRS Measure 24): Given the patient's age of greater than 50 and the fracture site (hip, distal radius, or spine), the patient should be tested for osteoporosis using DXA, and the appropriate treatment considered based on the DXA results.  Original Report Authenticated By: Gerrianne Scale, M.D.     MEDICATIONS: reviewed.      Assessment/Plan:  1. IgG lambda multiple  myeloma:  - continue with daily Revlimid 25mg  PO daily (21 days on, 7 days of; started on 03/21/11); Monday and Thursday SQ [name] (today is cycle #1, day #80, weekly Dexamethasone 40mg  PO qMondays.   He is on prophylactic Acyclovir, Bactrim while on chemo.  He was on prophylactic Lovenox which is on hold for kyphoplasty soon.  - Zometa will be delayed until after his dental extraction. - He will see [facility] soon to discuss role of BMT.   2.  Compression fracture:   I personally discussed the case with Rad Onc Dr. Lonie Peak who recommended kyphoplasty before radiation.  I personally discussed the case with Dr. Corliss Skains who graciously agreed to eval patient for kyphoplasty during this hospital course.   3.  Poor dental condition:  Need dental extraction by Dr. Dian Queen whom I discussed with today during this admission before starting Zometa for myeloma.    4.  Pain control:  Continue with Percocet prn moderate pain and morphine IV for severe pain.  He does not tolerate MS Contin due to pruritis. I will start Fentanyl patch instead.    FULL CODE.

## 2011-03-27 ENCOUNTER — Other Ambulatory Visit: Payer: Self-pay | Admitting: Certified Registered Nurse Anesthetist

## 2011-03-27 ENCOUNTER — Ambulatory Visit (HOSPITAL_COMMUNITY)
Admit: 2011-03-27 | Discharge: 2011-03-27 | Disposition: A | Discharge status: Home / Self Care | Payer: Medicaid Other | Attending: Oncology | Admitting: Oncology

## 2011-03-27 ENCOUNTER — Other Ambulatory Visit: Payer: Self-pay | Admitting: Interventional Radiology

## 2011-03-27 ENCOUNTER — Inpatient Hospital Stay (HOSPITAL_COMMUNITY): Admission: RE | Admit: 2011-03-27 | Payer: Self-pay | Source: Ambulatory Visit

## 2011-03-27 DIAGNOSIS — K0530 Chronic periodontitis, unspecified: Secondary | ICD-10-CM

## 2011-03-27 DIAGNOSIS — K0889 Other specified disorders of teeth and supporting structures: Secondary | ICD-10-CM | POA: Diagnosis present

## 2011-03-27 DIAGNOSIS — K045 Chronic apical periodontitis: Secondary | ICD-10-CM

## 2011-03-27 DIAGNOSIS — K036 Deposits [accretions] on teeth: Secondary | ICD-10-CM

## 2011-03-27 LAB — CBC
HCT: 28.1 % — ABNORMAL LOW (ref 39.0–52.0)
Platelets: 210 10*3/uL (ref 150–400)
RBC: 3.39 MIL/uL — ABNORMAL LOW (ref 4.22–5.81)
RDW: 16.8 % — ABNORMAL HIGH (ref 11.5–15.5)
WBC: 3.8 10*3/uL — ABNORMAL LOW (ref 4.0–10.5)

## 2011-03-27 LAB — BASIC METABOLIC PANEL
CO2: 27 mEq/L (ref 19–32)
Chloride: 96 mEq/L (ref 96–112)
GFR calc Af Amer: >90 mL/min (ref >90)
Potassium: 4.4 mEq/L (ref 3.5–5.1)

## 2011-03-27 LAB — PROTIME-INR: Prothrombin Time: 14 seconds (ref 11.6–15.2)

## 2011-03-27 MED ORDER — MIDAZOLAM HCL 5 MG/5ML IJ SOLN
INTRAMUSCULAR | Status: AC | PRN
  Administered 2011-03-27 (×5): 1 mg via INTRAVENOUS

## 2011-03-27 MED ORDER — HYDROMORPHONE HCL PF 1 MG/ML IJ SOLN
INTRAMUSCULAR | Status: AC | PRN
  Administered 2011-03-27: 1 mg

## 2011-03-27 MED ORDER — FENTANYL CITRATE 0.05 MG/ML IJ SOLN
INTRAMUSCULAR | Status: AC | PRN
  Administered 2011-03-27 (×6): 25 ug via INTRAVENOUS

## 2011-03-27 MED ORDER — HYDROMORPHONE HCL PF 1 MG/ML IJ SOLN
1.0000 mg | INTRAMUSCULAR | Status: DC | PRN
  Administered 2011-03-27 – 2011-03-28 (×4): 1 mg via INTRAVENOUS
  Filled 2011-03-27 (×4): qty 1

## 2011-03-27 MED ORDER — FLUMAZENIL 0.5 MG/5ML IV SOLN
INTRAVENOUS | Status: AC | PRN
  Administered 2011-03-27: 0.5 mg via INTRAVENOUS

## 2011-03-27 MED ORDER — HYDRALAZINE HCL 20 MG/ML IJ SOLN
5.0000 mg | Freq: Once | INTRAMUSCULAR | Status: AC
  Administered 2011-03-27: 20 mg via INTRAVENOUS

## 2011-03-27 MED ORDER — SODIUM CHLORIDE 0.9 % IV SOLN
INTRAVENOUS | Status: DC
  Administered 2011-03-27 – 2011-03-28 (×2): via INTRAVENOUS

## 2011-03-27 MED ORDER — DIPHENHYDRAMINE HCL 25 MG PO CAPS
25.0000 mg | ORAL_CAPSULE | Freq: Four times a day (QID) | ORAL | Status: DC | PRN
  Administered 2011-03-27: 25 mg via ORAL
  Filled 2011-03-27: qty 1

## 2011-03-27 MED ORDER — DIPHENHYDRAMINE HCL 50 MG/ML IJ SOLN
INTRAMUSCULAR | Status: AC | PRN
  Administered 2011-03-27: 50 mg via INTRAVENOUS

## 2011-03-27 MED ORDER — CEFAZOLIN SODIUM 1-5 GM-% IV SOLN
1.0000 g | INTRAVENOUS | Status: AC
  Administered 2011-03-28: 1 g via INTRAVENOUS
  Filled 2011-03-27: qty 50

## 2011-03-27 MED ORDER — IOHEXOL 300 MG/ML  SOLN
150.0000 mL | Freq: Once | INTRAMUSCULAR | Status: AC | PRN
  Administered 2011-03-27: 1 mg

## 2011-03-27 NOTE — Progress Notes (Signed)
PROGRESS NOTE  Shane Woodard WUJ:[id number] DOB: 02-Jun-1960 DOA: 03/26/2011 PCP: [name], MD, MD--Has only seen him once-therefore considered unassigned  Brief narrative: Fifth 51-year-old male admitted with pathological fractures in the setting of multiple myeloma  Past medical history: Multiple myeloma, IgG lambda wth initiate IgG lambda  spike 5.1 g/dL on [name] Revlimid dexamethasone acyclovir status post bone marrow biopsy 1/15, poor dentition requiring teeth removal, arthritis, anemia  Consultants:  Lorayne Bender, dental medicine  Oncology-Dr. Gaylyn Rong  Interventional radiology-Dr. Corliss Skains  Procedures:  Balloon Kyphoplasty of T12, L1, L2, L3 lumbar vertebrae 1/29  Antibiotics:  No   Subjective  Pain much better controlled. Able to actually turn in bed. No significant other issues.   Objective    Interim History: For surgery in am with Dr. Robin Searing.  Still bedrest.   Objective: Filed Vitals:   03/26/11 2125 03/27/11 0520 03/27/11 1545 03/27/11 1615  BP:  129/83 126/80 131/81  Pulse:  87 93 92  Temp:  97.6 F (36.4 C) 97.9 F (36.6 C) 98 F (36.7 C)  TempSrc:  Oral Oral Oral  Resp:  14 18 16   Height:      Weight:      SpO2: 89% 92% 96% 97%    Intake/Output Summary (Last 24 hours) at 03/27/11 1659 Last data filed at 03/27/11 0900  Gross per 24 hour  Intake      0 ml  Output   2125 ml  Net  -2125 ml    Exam:  HEENT-alert oriented pleasant male soft-spoken. No icterus some pallor throat clear. Poor dentition  CHEST-chest clinically clear. Did not set patient up to listen to chest as was the been into much pain abdomen soft nontender nondistended  CARDS-S1-S2 no murmurs rubs or gallops  ABD-soft nontender  SKIN-has a rash on the inner area of the size further rash not dizzy  NEURO- grossly normal strength first of his upper extremities reflexes 2 on 3 bilaterally flex strength is within normal limits but does cause significant pain when moving leg so  full neuro exam   Data Reviewed: Basic Metabolic Panel:  Lab 03/27/11 9562 03/26/11 0420  NA 128* 133*  K 4.4 3.8  CL 96 99  CO2 27 27  GLUCOSE 120* 105*  BUN 15 15  CREATININE 0.99 1.15  CALCIUM 10.3 10.2  MG -- --  PHOS -- --   Liver Function Tests: No results found for this basename: AST:5,ALT:5,ALKPHOS:5,BILITOT:5,PROT:5,ALBUMIN:5 in the last 168 hours No results found for this basename: LIPASE:5,AMYLASE:5 in the last 168 hours No results found for this basename: AMMONIA:5 in the last 168 hours CBC:  Lab 03/27/11 0315 03/26/11 0420  WBC 3.8* 2.8*  NEUTROABS -- 1.9  HGB 9.1* 9.6*  HCT 28.1* 30.2*  MCV 82.9 83.0  PLT 210 213   Cardiac Enzymes: No results found for this basename: CKTOTAL:5,CKMB:5,CKMBINDEX:5,TROPONINI:5 in the last 168 hours BNP: No components found with this basename: POCBNP:5 CBG: No results found for this basename: GLUCAP:5 in the last 168 hours  No results found for this or any previous visit (from the past 240 hour(s)).   Studies: Dg Lumbar Spine Complete  03/26/2011  *RADIOLOGY REPORT*  Clinical Data: Low back pain.  History of multiple myeloma.  LUMBAR SPINE - COMPLETE 4+ VIEW  Comparison: None survey 03/14/2011  Findings: Sacralization of L5.  Visualization of the lumbar vertebrae is limited due to bowel gas overlying but the vertebral elements appear demonstrate normal alignment.  Since the previous study, there is interval development of  multiple lumbar compression fractures including T11, L1, L2, L3 vertebrae.  There is approximately 810% loss of vertebral height at T11, 30% at L1, 50% of L2, 30% at L3.  Changes are consistent with history of multiple myeloma.  No retropulsion of fracture fragments.  IMPRESSION: Interval development since recent previous study of multiple anterior compression fractures involving T11, L1, L2, and L3 vertebrae.  Original Report Authenticated By: Marlon Pel, M.D.   Mr Lumbar Spine W Wo  Contrast  03/26/2011  *RADIOLOGY REPORT*  Clinical Data: Severe back pain for several days.  History of multiple myeloma.  Compression fractures on radiographs.  MRI LUMBAR SPINE WITHOUT AND WITH CONTRAST  Technique:  Multiplanar and multiecho pulse sequences of the lumbar spine were obtained without and with intravenous contrast.  Contrast: 15mL MULTIHANCE GADOBENATE DIMEGLUMINE 529 MG/ML IV SOLN  Comparison: Lumbar spine radiographs 03/26/2011 and abdominal pelvic CT 03/02/2011.  Findings: As correlated with prior studies, there is transitional lumbosacral anatomy.  There are small ribs at T12 with a nearly fully sacralized L5 segment.  There is a mild convex left scoliosis centered at L2.  The lateral alignment is normal.  Schmorl's nodes and associated endplate edema at W09-W11 are unchanged from the prior CT.  There has been interval development of multiple compression deformities involving the superior endplates of T12, L1, L2 and L3.  The inferior endplate of L1 is also mildly depressed.  There is no osseous retropulsion.  The L1 fracture results in approximately 60% loss of vertebral body height.  There is minimal associated marrow edema and enhancement.  No epidural hematoma, paraspinal enhancing lesion or discrete myelomatous lesions are identified.  The conus medullaris extends to the T12-L1 level and appears normal.  There is no abnormal intradural enhancement.  There are no paraspinal abnormalities.  L1-L2:  Mild disc bulging.  No spinal stenosis or nerve root encroachment.  L2-L3:  Disc height and hydration are maintained.  There is mild facet and ligamentous hypertrophy.  No spinal stenosis or nerve root encroachment results.  L3-L4:  Mild disc bulge with mild facet and ligamentous hypertrophy.  No spinal stenosis or nerve root encroachment.  L4-L5:  Mild disc bulge with mild facet and ligamentous hypertrophy.  No significant spinal stenosis or nerve root encroachment.  L5-S1:  As numbered, this is a  transitional disc space level without abnormality.  IMPRESSION:  1.  Transitional lumbosacral anatomy.  L5 is largely sacralized. 2.  Compression fractures at T12, L1, L2 and L3 have developed since the CT of 3 weeks ago.  Each of these fractures demonstrates only mild edema and marrow enhancement.  No definite pathologic features or associated osseous retropulsion identified. However, if spinal augmentation is performed, marrow biopsy should be considered given the patient's history. 3.  Minimal spondylosis.  Per CMS PQRS reporting requirements (PQRS Measure 24): Given the patient's age of greater than 50 and the fracture site (hip, distal radius, or spine), the patient should be tested for osteoporosis using DXA, and the appropriate treatment considered based on the DXA results.  Original Report Authenticated By: Gerrianne Scale, M.D.   Ct Abdomen Pelvis W Contrast  03/02/2011  *RADIOLOGY REPORT*  Clinical Data: Left abdominal, flank, and left lower quadrant pain.  CT ABDOMEN AND PELVIS WITH CONTRAST  Technique:  Multidetector CT imaging of the abdomen and pelvis was performed following the standard protocol during bolus administration of intravenous contrast.  Contrast: OMNIPAQUE IOHEXOL 300 MG/ML IV SOLN  Comparison: 10/13/2008  Findings: The  liver, gallbladder, pancreas, spleen, both adrenal glands, and kidneys are normal in appearance.  No evidence of hydronephrosis.  No soft tissue masses or lymphadenopathy identified within the abdomen or pelvis.  No inflammatory process or abnormal fluid collections are identified within the abdomen or pelvis.  There is no evidence of bowel wall thickening or dilatation.  Normal appendix is visualized.  IMPRESSION: Negative.  No acute findings or other significant abnormality.  Original Report Authenticated By: Danae Orleans, M.D.   Dg Bone Survey Met  03/14/2011  *RADIOLOGY REPORT*  Clinical Data: Low back pain for 2-3 months. Anemia.  Possible multiple  myeloma.  METASTATIC BONE SURVEY  Comparison: CT abdomen and pelvis 03/01/2010.  Findings: A small focal lucency is seen in the left humeral head. On the PA view of the thoracic spine, there appears to be mild vertebral body height loss at T10 eccentric to the right.  A focal lucency is seen in the frontal bone of the skull.  Subtle lucent foci are also seen in the C2, C3, C4 and C5 vertebral bodies.  The axial and appendicular skeleton is otherwise unremarkable.  IMPRESSION: Scattered, subtle lucent lesions of bone or worrisome for myeloma. As noted above, the PA view of the thoracic spine, there appears to be mild compression fracture of T10 eccentric to the right.  Original Report Authenticated By: Bernadene Bell. D'ALESSIO, M.D.    Scheduled Meds:   . sodium chloride   Intravenous STAT  . acyclovir  400 mg Oral Daily  .  ceFAZolin (ANCEF) IV  1 g Intravenous Once  .  ceFAZolin (ANCEF) IV  1 g Intravenous 60 min Pre-Op  . cyclobenzaprine  5 mg Oral TID  . dexamethasone  40 mg Oral Q7 days  . fentaNYL  25 mcg Transdermal Q72H  . lenalidomide  25 mg Oral Daily  . morphine  15 mg Oral Q12H  . sulfamethoxazole-trimethoprim  1 tablet Oral Q M,W,F   Continuous Infusions:   . sodium chloride 50 mL/hr at 03/27/11 1624     Assessment/Plan Diagnoses   .  Anemia   .  Arthritis   .  Multiple myeloma    1. Multilevel thoracolumbar compression fractures-these are likely secondary to myeloma. Dr. Gaylyn Rong is aware of the patient and Dr. Gerlene Fee of neurosurgery was consulted by the emergency room. Day #1 post balloon kyphoplasty Patient might benefit from having palliative radiotherapy for the pain. Per Dr. half will be discussed with radiation oncology  For comfort sake we will get the patient a Foley catheter until seen by Dr. Half  2. Multiple myeloma with IgG spike-per oncologist. Cliffton Asters blood cell count up from 2.8-3.8 remains anemic with hemoglobin in the 9 range.  Platelets are normal. He will continue  his regular chemotherapy in addition to suppressive antibiotics and acyclovir 3. Anemia this seems stable at present time  4. Mild hypertension-likely secondary to pain 5. poor dentition-surgery tomorrow   Code Status: Full  Family Communication: Case discussed with father-in-law Disposition Plan: Home in 2-3 days   Pleas Koch, MD  [facility] Pager [phone] 03/27/2011, 4:59 PM    LOS: 1 day

## 2011-03-27 NOTE — Procedures (Signed)
S/P Balloon KP at T12,L1,L2 AND L3  For painful pathological compression fractures.Marland Kitchen Hx of multiple myeloma

## 2011-03-27 NOTE — Progress Notes (Signed)
DENTAL MEDICINE PROGRESS NOTE  Date:    03/27/2011 Patient Name:   Shane Woodard Date of Birth:   03/04/1960 Medical Record Number: [id number]  VITALS: BP 129/83  Pulse 87  Temp(Src) 97.6 F (36.4 C) (Oral)  Resp 14  Ht 5\' 6"  (1.676 m)  Wt 167 lb (75.751 kg)  BMI 26.95 kg/m2  SpO2 92%   CHIEF COMPLAINT: Pre-Zometa Dental Protocol.  HPI: Hermen Mario was recently seen for a pre-Zometa dental evaluation. After a thorough discussion of the risks, benefits, complications of various treatment options, the patient decided to proceed with extraction of all remaining teeth with alveoloplasty as indicated in the operating room. Patient was scheduled to be seen in the operating room as an outpatient, however, the patient was recently admitted and underwent a kyphoplasty today. Patient does agree to proceed with extraction of remaining teeth tomorrow in the operating room at 7:30 in the morning. This will be performed in the operating room tomorrow.   PMH: Past Medical History  Diagnosis Date  . Anemia   . Arthritis   . Multiple myeloma     PSH: Past Surgical History  Procedure Date  . Left thumb surgery 11/26/2003    ALLERGIES: No Known Allergies  MEDICATIONS: Current Facility-Administered Medications  Medication Dose Route Frequency Provider Last Rate Last Dose  . 0.9 %  sodium chloride infusion   Intravenous STAT Nicholes Stairs, MD 50 mL/hr at 03/26/11 1050    . 0.9 %  sodium chloride infusion   Intravenous Continuous Oneal Grout, MD      . acyclovir (ZOVIRAX) tablet 400 mg  400 mg Oral Daily Pleas Koch, MD   400 mg at 03/27/11 0905  . bortezomib SQ ([name]) chemo injection 2.5 mg  1.3 mg/m2 (Treatment Plan Actual) Subcutaneous Once Jethro Bolus, MD   2.5 mg at 03/26/11 1655  . ceFAZolin (ANCEF) IVPB 1 g/50 mL premix  1 g Intravenous Once Oneal Grout, MD   1 g at 03/27/11 1205  . ceFAZolin (ANCEF) IVPB 1 g/50 mL premix  1 g Intravenous 60 min Pre-Op Charlynne Pander,  DDS      . cyclobenzaprine (FLEXERIL) tablet 5 mg  5 mg Oral TID Pleas Koch, MD   5 mg at 03/27/11 0905  . dexamethasone (DECADRON) 40 mg  40 mg Oral Q7 days Pleas Koch, MD   40 mg at 03/26/11 1512  . diphenhydrAMINE (BENADRYL) capsule 25 mg  25 mg Oral Q6H PRN Pleas Koch, MD   25 mg at 03/27/11 0030  . fentaNYL (DURAGESIC - dosed mcg/hr) patch 25 mcg  25 mcg Transdermal Q72H Jethro Bolus, MD   25 mcg at 03/26/11 2033  . HYDROmorphone (DILAUDID) injection 1 mg  1 mg Intravenous Q30 min PRN Pleas Koch, MD   1 mg at 03/27/11 1514  . lenalidomide (REVLIMID) capsule 25 mg  25 mg Oral Daily Pleas Koch, MD      . morphine (MS CONTIN) 12 hr tablet 15 mg  15 mg Oral Q12H Nicholes Stairs, MD   15 mg at 03/27/11 0905  . ondansetron (ZOFRAN) tablet 8 mg  8 mg Oral Once Jethro Bolus, MD   8 mg at 03/26/11 1618  . polyethylene glycol (MIRALAX / GLYCOLAX) packet 17 g  17 g Oral Daily PRN Pleas Koch, MD   17 g at 03/26/11 1513  . sulfamethoxazole-trimethoprim (BACTRIM DS,SEPTRA DS) 800-160 MG per tablet 1 tablet  1 tablet Oral Q M,W,F Pleas Koch, MD  Facility-Administered Medications Ordered in Other Encounters  Medication Dose Route Frequency Provider Last Rate Last Dose  . diphenhydrAMINE (BENADRYL) injection   Intravenous PRN Oneal Grout, MD   50 mg at 03/27/11 1154  . fentaNYL (SUBLIMAZE) injection   Intravenous PRN Oneal Grout, MD   25 mcg at 03/27/11 1300  . flumazenil (ROMAZICON) injection   Intravenous PRN Oneal Grout, MD   0.5 mg at 03/27/11 1340  . hydrALAZINE (APRESOLINE) injection 5 mg  5 mg Intravenous Once Oneal Grout, MD   20 mg at 03/27/11 1255  . hydrALAZINE (APRESOLINE) injection 5 mg  5 mg Intravenous Once Oneal Grout, MD   20 mg at 03/27/11 1313  . HYDROmorphone (DILAUDID) injection    PRN Oneal Grout, MD   1 mg at 03/27/11 1213  . iohexol (OMNIPAQUE) 300 MG/ML solution 150 mL  150 mL Other Once PRN Oneal Grout, MD   1 mg at  03/27/11 1325  . midazolam (VERSED) 5 MG/5ML injection   Intravenous PRN Oneal Grout, MD   1 mg at 03/27/11 1300    LABS: Lab Results  Component Value Date   WBC 3.8* 03/27/2011   HGB 9.1* 03/27/2011   HCT 28.1* 03/27/2011   MCV 82.9 03/27/2011   PLT 210 03/27/2011      Component Value Date/Time   NA 128* 03/27/2011 0315   K 4.4 03/27/2011 0315   CL 96 03/27/2011 0315   CO2 27 03/27/2011 0315   GLUCOSE 120* 03/27/2011 0315   BUN 15 03/27/2011 0315   CREATININE 0.99 03/27/2011 0315   CREATININE 1.03 03/13/2011 0812   CALCIUM 10.3 03/27/2011 0315   GFRNONAA >90 03/27/2011 0315   GFRAA >90 03/27/2011 0315     Subjective: Patient denies any acute dental pain now. Patient indicates that he does wish to proceed with dental extractions tomorrow in the operating room.  Objective: BP 129/83  Pulse 87  Temp(Src) 97.6 F (36.4 C) (Oral)  Resp 14  Ht 5\' 6"  (1.676 m)  Wt 167 lb (75.751 kg)  BMI 26.95 kg/m2  SpO2 92% Exam: Dentition as before. There is evidence of the multiple multiple teeth with chronic periodontitis. No obvious abscess formation is noted at this time although the patient does have periapical pathology per review of the previous dental radiographs.  Assessment: 1. Chronic periodontitis with bone loss 2. Tooth mobility 3. Apical periodontitis 4. H/O acute pulpitis  Plan: 1. Proceed with removal of all remaining teeth with alveoloplasty in the OR tomorrow at 7:30 am. 2. Discussion of findings with medical team and coordination of future medical and dental care.  Charlynne Pander, DDS

## 2011-03-27 NOTE — Progress Notes (Signed)
Spoke with [facility].  Patient will have procedure today.  Juliette Alcide states that he will need to be @ IR [facility] by 1000.  Juliette Alcide states that [facility] will hang the preop Ancef that has been ordered before the procedure.  Nurse contacted [facility] to advise that the patient needed to transfer by 1000.  Doug from [facility] verbalized understanding.

## 2011-03-28 ENCOUNTER — Ambulatory Visit (HOSPITAL_COMMUNITY): Admission: RE | Admit: 2011-03-28 | Payer: Self-pay | Source: Ambulatory Visit | Admitting: Dentistry

## 2011-03-28 ENCOUNTER — Encounter (HOSPITAL_COMMUNITY): Payer: Self-pay | Admitting: Anesthesiology

## 2011-03-28 ENCOUNTER — Other Ambulatory Visit (HOSPITAL_COMMUNITY): Payer: Self-pay

## 2011-03-28 ENCOUNTER — Inpatient Hospital Stay (HOSPITAL_COMMUNITY): Payer: Medicaid Other | Admitting: Anesthesiology

## 2011-03-28 ENCOUNTER — Encounter (HOSPITAL_COMMUNITY)
Admission: EM | Disposition: A | Discharge status: Home / Self Care | Payer: Self-pay | Source: Home / Self Care | Attending: Emergency Medicine

## 2011-03-28 DIAGNOSIS — K045 Chronic apical periodontitis: Secondary | ICD-10-CM

## 2011-03-28 DIAGNOSIS — K0530 Chronic periodontitis, unspecified: Secondary | ICD-10-CM

## 2011-03-28 DIAGNOSIS — E871 Hypo-osmolality and hyponatremia: Secondary | ICD-10-CM | POA: Diagnosis present

## 2011-03-28 DIAGNOSIS — K036 Deposits [accretions] on teeth: Secondary | ICD-10-CM

## 2011-03-28 HISTORY — PX: MULTIPLE EXTRACTIONS WITH ALVEOLOPLASTY: SHX5342

## 2011-03-28 SURGERY — MULTIPLE EXTRACTION WITH ALVEOLOPLASTY
Anesthesia: General | Site: Mouth | Wound class: Clean Contaminated

## 2011-03-28 MED ORDER — NEOSTIGMINE METHYLSULFATE 1 MG/ML IJ SOLN
INTRAMUSCULAR | Status: DC | PRN
  Administered 2011-03-28: 4 mg via INTRAVENOUS

## 2011-03-28 MED ORDER — ROCURONIUM BROMIDE 100 MG/10ML IV SOLN
INTRAVENOUS | Status: DC | PRN
  Administered 2011-03-28: 30 mg via INTRAVENOUS
  Administered 2011-03-28: 20 mg via INTRAVENOUS
  Administered 2011-03-28: 10 mg via INTRAVENOUS

## 2011-03-28 MED ORDER — SODIUM CHLORIDE 0.9 % IV SOLN
INTRAVENOUS | Status: DC
  Administered 2011-03-28 – 2011-03-29 (×3): via INTRAVENOUS

## 2011-03-28 MED ORDER — PROMETHAZINE HCL 25 MG/ML IJ SOLN: 6.2500 mg | INTRAMUSCULAR | Status: DC | PRN

## 2011-03-28 MED ORDER — LACTATED RINGERS IV SOLN: INTRAVENOUS | Status: DC

## 2011-03-28 MED ORDER — LIDOCAINE-EPINEPHRINE 2 %-1:100000 IJ SOLN
INTRAMUSCULAR | Status: AC
  Filled 2011-03-28: qty 10.2

## 2011-03-28 MED ORDER — MIDAZOLAM HCL 5 MG/5ML IJ SOLN
INTRAMUSCULAR | Status: DC | PRN
  Administered 2011-03-28: 2 mg via INTRAVENOUS

## 2011-03-28 MED ORDER — FENTANYL CITRATE 0.05 MG/ML IJ SOLN
INTRAMUSCULAR | Status: DC | PRN
  Administered 2011-03-28 (×2): 50 ug via INTRAVENOUS
  Administered 2011-03-28: 100 ug via INTRAVENOUS
  Administered 2011-03-28: 50 ug via INTRAVENOUS

## 2011-03-28 MED ORDER — ONDANSETRON HCL 4 MG/2ML IJ SOLN
INTRAMUSCULAR | Status: DC | PRN
  Administered 2011-03-28: 4 mg via INTRAVENOUS

## 2011-03-28 MED ORDER — FENTANYL CITRATE 0.05 MG/ML IJ SOLN
INTRAMUSCULAR | Status: AC
  Filled 2011-03-28: qty 2

## 2011-03-28 MED ORDER — MORPHINE SULFATE 4 MG/ML IJ SOLN
2.0000 mg | INTRAMUSCULAR | Status: DC | PRN
  Administered 2011-03-28 – 2011-03-29 (×6): 4 mg via INTRAVENOUS
  Filled 2011-03-28 (×6): qty 1

## 2011-03-28 MED ORDER — BUPIVACAINE-EPINEPHRINE PF 0.5-1:200000 % IJ SOLN
INTRAMUSCULAR | Status: AC
  Filled 2011-03-28: qty 3.6

## 2011-03-28 MED ORDER — BUPIVACAINE-EPINEPHRINE PF 0.5-1:200000 % IJ SOLN
INTRAMUSCULAR | Status: DC | PRN
  Administered 2011-03-28: 3.6 mL

## 2011-03-28 MED ORDER — OXYMETAZOLINE HCL 0.05 % NA SOLN
NASAL | Status: DC | PRN
  Administered 2011-03-28 (×2): 2 via NASAL

## 2011-03-28 MED ORDER — PROPOFOL 10 MG/ML IV BOLUS
INTRAVENOUS | Status: DC | PRN
  Administered 2011-03-28: 100 mg via INTRAVENOUS

## 2011-03-28 MED ORDER — LIDOCAINE HCL (CARDIAC) 20 MG/ML IV SOLN
INTRAVENOUS | Status: DC | PRN
  Administered 2011-03-28: 75 mg via INTRAVENOUS

## 2011-03-28 MED ORDER — LIDOCAINE-EPINEPHRINE 2 %-1:100000 IJ SOLN
INTRAMUSCULAR | Status: DC | PRN
  Administered 2011-03-28: 10.2 mL

## 2011-03-28 MED ORDER — LACTATED RINGERS IV SOLN
INTRAVENOUS | Status: DC | PRN
  Administered 2011-03-28: 07:00:00 via INTRAVENOUS

## 2011-03-28 MED ORDER — OXYCODONE-ACETAMINOPHEN 5-325 MG PO TABS: 1.0000 | ORAL_TABLET | ORAL | Status: DC | PRN

## 2011-03-28 MED ORDER — FENTANYL CITRATE 0.05 MG/ML IJ SOLN
25.0000 ug | INTRAMUSCULAR | Status: DC | PRN
  Administered 2011-03-28 (×4): 25 ug via INTRAVENOUS

## 2011-03-28 MED ORDER — GLYCOPYRROLATE 0.2 MG/ML IJ SOLN
INTRAMUSCULAR | Status: DC | PRN
  Administered 2011-03-28: .6 mg via INTRAVENOUS

## 2011-03-28 MED ORDER — ISOPROPYL ALCOHOL 70 % SOLN
Status: DC | PRN
  Administered 2011-03-28: 1 via TOPICAL

## 2011-03-28 MED ORDER — LABETALOL HCL 5 MG/ML IV SOLN
5.0000 mg | INTRAVENOUS | Status: DC | PRN
  Administered 2011-03-28 (×2): 5 mg via INTRAVENOUS
  Filled 2011-03-28 (×2): qty 4

## 2011-03-28 MED FILL — Chlorhexidine Gluconate Liquid 4%: CUTANEOUS | Qty: 30 | Status: AC

## 2011-03-28 MED FILL — Tobramycin Sulfate For Inj 1.2 GM: INTRAMUSCULAR | Qty: 1 | Status: AC

## 2011-03-28 SURGICAL SUPPLY — 22 items
ATTRACTOMAT 16X20 MAGNETIC DRP (DRAPES) ×2 IMPLANT
BAG ZIPLOCK 12X15 (MISCELLANEOUS) IMPLANT
BLADE SURG 15 STRL LF DISP TIS (BLADE) ×2 IMPLANT
BLADE SURG 15 STRL SS (BLADE) ×2
CLOTH BEACON ORANGE TIMEOUT ST (SAFETY) ×2 IMPLANT
GAUZE SPONGE 4X4 16PLY XRAY LF (GAUZE/BANDAGES/DRESSINGS) ×2 IMPLANT
GLOVE SURG ORTHO 8.0 STRL STRW (GLOVE) ×2 IMPLANT
GLOVE SURG SS PI 6.5 STRL IVOR (GLOVE) ×2 IMPLANT
KIT BASIN OR (CUSTOM PROCEDURE TRAY) ×2 IMPLANT
NS IRRIG 1000ML POUR BTL (IV SOLUTION) ×2 IMPLANT
PACK EENT SPLIT (PACKS) ×2 IMPLANT
PACKING VAGINAL (PACKING) ×2 IMPLANT
PAD EYE OVAL STERILE LF (GAUZE/BANDAGES/DRESSINGS) ×4 IMPLANT
SPONGE GAUZE 4X4 12PLY (GAUZE/BANDAGES/DRESSINGS) IMPLANT
SUCTION FRAZIER 12FR DISP (SUCTIONS) ×2 IMPLANT
SUT CHROMIC 3 0 PS 2 (SUTURE) ×8 IMPLANT
SUT CHROMIC 4 0 P 3 18 (SUTURE) IMPLANT
SYR 50ML LL SCALE MARK (SYRINGE) ×2 IMPLANT
TUBING CONNECTING 10 (TUBING) ×2 IMPLANT
VESSEL CANN W0 1 W VA 30003 (MISCELLANEOUS) ×2 IMPLANT
WATER STERILE IRR 1500ML POUR (IV SOLUTION) ×2 IMPLANT
YANKAUER SUCT BULB TIP NO VENT (SUCTIONS) ×2 IMPLANT

## 2011-03-28 NOTE — Anesthesia Preprocedure Evaluation (Signed)
Anesthesia Evaluation  Patient identified by MRN, date of birth, ID band Patient awake  General Assessment Comment:H/o multiple myeloma. S/p kyphoplasty  Reviewed: Allergy & Precautions, H&P , NPO status , Patient's Chart, lab work & pertinent test results  Airway Mallampati: II TM Distance: >3 FB Neck ROM: Full    Dental  (+) Poor Dentition and Loose   Pulmonary neg pulmonary ROS,  clear to auscultation  Pulmonary exam normal       Cardiovascular neg cardio ROS Regular Normal    Neuro/Psych Negative Neurological ROS  Negative Psych ROS   GI/Hepatic negative GI ROS, Neg liver ROS,   Endo/Other  Negative Endocrine ROS  Renal/GU negative Renal ROS  Genitourinary negative   Musculoskeletal negative musculoskeletal ROS (+)   Abdominal   Peds negative pediatric ROS (+)  Hematology negative hematology ROS (+)   Anesthesia Other Findings   Reproductive/Obstetrics negative OB ROS                           Anesthesia Physical Anesthesia Plan  ASA: III  Anesthesia Plan: General   Post-op Pain Management:    Induction: Intravenous  Airway Management Planned: Nasal ETT  Additional Equipment:   Intra-op Plan:   Post-operative Plan: Extubation in OR  Informed Consent: I have reviewed the patients History and Physical, chart, labs and discussed the procedure including the risks, benefits and alternatives for the proposed anesthesia with the patient or authorized representative who has indicated his/her understanding and acceptance.   Dental advisory given  Plan Discussed with: CRNA  Anesthesia Plan Comments:         Anesthesia Quick Evaluation

## 2011-03-28 NOTE — Consult Note (Signed)
[facility] INPATIENT PROGRESS NOTE  Name: Shane Woodard      MRN: [id number]    Location: 1313/1313-01  Date: 03/28/2011 Time:12:02 PM  DIAGNOSIS: IgG lambda multiple myeloma; presented with anemia, and bone pain. Initial M-spike was 5.1gm/dL; free serum lambda of 4.09 mg/dL; Ig G 7760 mg/dL; [id number] of 2.47. Bone marrow biopsy was performed today; result pending including FISH and cytogenetics.  CURRENT THERAPY: due to start on 03/19/11 [name] 1.3mg /m2 SQ d1, 4,8,11 q28 day; Revlimid 25 mg PO d1-21 q28day; Dexamethasone 40mg  PO weekly (even of week off of chemo). He is also on Acyclovir 400mg  PO BID; Bactrim DS Mon/Wed/Fri; Lovenox 40mg  SQ daily. Zometa is due to start after he is cleared by dentist.  Subjective: Interval History:Sadrac Dearinger had kyphoplasty yesterday with some improvement of his back pain.  He had dental extraction today. With movement, he still has crampy pain in the back.  He denies leg weakness, paresthesia, bowel/bladder incontinence.  He denies fever, mucositis, nausea/vomiting, skin rash.   Objective: Vital signs in last 24 hours: Temp:  [96.8 F (36 C)-98.4 F (36.9 C)] 98.2 F (36.8 C) (01/30 1045) Pulse Rate:  [80-97] 83  (01/30 1030) Resp:  [8-18] 14  (01/30 1045) BP: (126-170)/(76-131) 134/87 mmHg (01/30 1045) SpO2:  [92 %-100 %] 97 % (01/30 1030)    Intake/Output from previous day: 01/29 0701 - 01/30 0700 In: 440 [P.O.:240; I.V.:200] Out: 2625 [Urine:2625]    PHYSICAL EXAM:  General: well-nourished in pain.   Eyes: no scleral icterus. ENT: packing post dental extraction without brisk bleeding.  Neck was without thyromegaly. Lymphatics: Negative cervical, supraclavicular or axillary adenopathy. Respiratory: lungs were clear bilaterally without wheezing or crackles. Cardiovascular: Regular rate and rhythm, S1/S2, without murmur, rub or gallop. There was no pedal edema. GI: abdomen was soft, flat, nontender, nondistended, without  organomegaly. Muscoloskeletal: in brace.   Skin exam was without echymosis, petichae. there was hyperpigmented overall skin changes of the extensor surface of bilateral hands.  He was able to lift bilateral legs off of his bed.   Studies/Results: Results for orders placed during the hospital encounter of 03/26/11 (from the past 48 hour(s))  CBC     Status: Abnormal   Collection Time   03/27/11  3:15 AM      Component Value Range Comment   WBC 3.8 (*) 4.0 - 10.5 (K/uL)    RBC 3.39 (*) 4.22 - 5.81 (MIL/uL)    Hemoglobin 9.1 (*) 13.0 - 17.0 (g/dL)    HCT 57.8 (*) 46.9 - 52.0 (%)    MCV 82.9  78.0 - 100.0 (fL)    MCH 26.8  26.0 - 34.0 (pg)    MCHC 32.4  30.0 - 36.0 (g/dL)    RDW 62.9 (*) 52.8 - 15.5 (%)    Platelets 210  150 - 400 (K/uL)   BASIC METABOLIC PANEL     Status: Abnormal   Collection Time   03/27/11  3:15 AM      Component Value Range Comment   Sodium 128 (*) 135 - 145 (mEq/L)    Potassium 4.4  3.5 - 5.1 (mEq/L)    Chloride 96  96 - 112 (mEq/L)    CO2 27  19 - 32 (mEq/L)    Glucose, Bld 120 (*) 70 - 99 (mg/dL)    BUN 15  6 - 23 (mg/dL)    Creatinine, Ser 4.13  0.50 - 1.35 (mg/dL)    Calcium 24.4  8.4 - 10.5 (mg/dL)  GFR calc non Af Amer >90  >90 (mL/min)    GFR calc Af Amer >90  >90 (mL/min)   PROTIME-INR     Status: Normal   Collection Time   03/27/11  3:15 AM      Component Value Range Comment   Prothrombin Time 14.0  11.6 - 15.2 (seconds)    INR 1.06  0.00 - 1.49      MEDICATIONS: reviewed.      Assessment/Plan:  1. IgG lambda multiple myeloma:  - continue with daily Revlimid 25mg  PO daily (21 days on, 7 days of; started on 03/21/11); Monday and Thursday SQ [name] (next dose of [name] SQ is due tomorrow Thu 03/29/11 which will be day 11, cycle 1), weekly Dexamethasone 40mg  PO qMondays.   He is on prophylactic Acyclovir, Bactrim while on chemo.  He was on prophylactic Lovenox which has been on hold.  Will consider resuming this once cleared by dentist Dr.  Kristin Bruins.  - Zometa will be delayed for about 2-3 wks to allow healing from dental extraction today.  - He will see [facility] B[facility] soon to discuss role of BMT.   2.  Compression fracture: s/p kyphoplasty yesterday with some mild relief of pain.  He will be evaluated by Rad Onc for radiation in about 2-3 weeks.   3.  Pain control:  Continue with Percocet prn moderate pain and morphine IV for severe pain.  He does not tolerate MS Contin due to pruritis; I thus d/c MS Contin.  He has been on Fentanyl 43mcg/hr transdermal change q72 hour for long acting.  I encouraged him to take oral break through pain meds if possble with goal toward discharge in the next 24-48 hours when pain control is adequate.   FULL CODE.

## 2011-03-28 NOTE — Transfer of Care (Signed)
Immediate Anesthesia Transfer of Care Note  Patient: Shane Woodard  Procedure(s) Performed:  MULTIPLE EXTRACION WITH ALVEOLOPLASTY - Extraction of tooth #'s 2,3,4,5,6,7,8,9,10,11,12,13,14,15,17, 20,21,22,23,24,25,26,27,28, 31, and 32 with alveoloplasty and mandibular left torus reduction.  Patient Location: PACU  Anesthesia Type: General  Level of Consciousness: sedated, patient cooperative and responds to stimulation  Airway & Oxygen Therapy: Patient Spontanous Breathing and Patient connected to face mask oxygen  Post-op Assessment: Report given to PACU RN, Post -op Vital signs reviewed and stable and Patient moving all extremities X 4  Post vital signs: Reviewed and stable  Complications: No apparent anesthesia complications

## 2011-03-28 NOTE — Anesthesia Postprocedure Evaluation (Signed)
  Anesthesia Post-op Note  Patient: Shane Woodard  Procedure(s) Performed:  MULTIPLE EXTRACION WITH ALVEOLOPLASTY - Extraction of tooth #'s 2,3,4,5,6,7,8,9,10,11,12,13,14,15,17, 20,21,22,23,24,25,26,27,28, 31, and 32 with alveoloplasty and mandibular left torus reduction.  Patient Location: PACU  Anesthesia Type: General  Level of Consciousness: awake and alert   Airway and Oxygen Therapy: Patient Spontanous Breathing  Post-op Pain: mild  Post-op Assessment: Post-op Vital signs reviewed, Patient's Cardiovascular Status Stable, Respiratory Function Stable, Patent Airway and No signs of Nausea or vomiting  Post-op Vital Signs: stable  Complications: No apparent anesthesia complications

## 2011-03-28 NOTE — Op Note (Signed)
Patient:            Shane Woodard Date of Birth:  1960-10-05 MRN:                [id number]   DATE OF PROCEDURE:  03/28/2011               OPERATIVE REPORT   PREOPERATIVE DIAGNOSES: 1. multiple myeloma with bony metastases 2. active chemotherapy 3. pre-Zometa therapy dental protocol 4. chronic periodontitis 5. apical periodontitis   POSTOPERATIVE DIAGNOSES: 1. multiple myeloma with bony metastases 2. active chemotherapy 3. pre-Zometa therapy dental protocol 4. chronic periodontitis 5. apical periodontitis 6. mandibular left torus    OPERATIONS: 1. Multiple extraction of tooth numbers 2, 3, 4, 5, 6, 7, 8, 9, 10, 11, 12, 13, 14, 15, 17, 20, 21, 22, 23, 24, 25, 26, 27, 28, 31, and 32. 2. 4 Quadrants of alveoloplasty 3. mandibular left torus reduction   SURGEON: Charlynne Pander, DDS  ASSISTANT: Zettie Pho, (dental assistant)  ANESTHESIA: General anesthesia via nasoendotracheal tube.  MEDICATIONS: 1. Ancef 2 g IV prior to invasive dental procedures. 2. Local anesthesia with a total utilization of 5 carpules each containing 34 mg of lidocaine with 0.017 mg of epinephrine as well as 2 carpules each containing 9 mg of bupivacaine with 0.009 mg of epinephrine.  SPECIMENS: There are 24 teeth that were discarded.  DRAINS: None  CULTURES: None  COMPLICATIONS: None   ESTIMATED BLOOD LOSS: 100 mLs.  INTRAVENOUS FLUIDS: Lactated ringers solution per anesthesia team record  INDICATIONS: The patient was recently diagnosed with chronic periodontitis, apical periodontitis, and multiple mobile teeth.  A dental consultation was then requested to rule out dental infection that may affect the patient's systemic health while undergoing active chemotherapy as well as prevent future complications of osteonecrosis of the jaw related to anticipated bisphosphonate therapy.  The patient was examined and treatment planned for extraction remaining teeth with alveoloplasty and  pre-prosthetic surgery as indicated.  OPERATIVE FINDINGS: Patient was examined operating room number 11.  The teeth were identified for extraction. The patient was noted be affected by chronic periodontitis, apical periodontitis, multiple mobile teeth and the presence of a mandibular left lingual torus.   DESCRIPTION OF PROCEDURE: Patient was brought to the main operating room number 11. Patient was then placed in the supine position on the operating table. General Anesthesia was then induced per the anesthesia team. The patient was then prepped and draped in the usual manner for dental medicine procedure. A timeout was performed. The patient was identified and procedures were verified. A throat pack was placed at this time. The oral cavity was then thoroughly examined with the findings noted above. The patient was then ready for dental medicine procedure as follows:  Local anesthesia was then administered sequentially with a total utilization of 5 carpules each containing 34 mg of lidocaine with 0.017 mg of epinephrine as well as 2 carpules  each containing 9 mg bupivacaine with 0.009 mg of epinephrine.  The Maxillary left and right quadrants first approached. Anesthesia was then delivered utilizing infiltration with lidocaine with epinephrine. A #15 blade incision was then made from the maxillary right tuberosity and extended to the maxillary left tuberosity.  A  surgical flap was then carefully reflected. Appropriate amounts of buccal and interseptal bone were then removed utilizing a surgical handpiece and bur and copious amounts of sterile saline.  The teeth were then subluxated with a series of straight elevators. Tooth numbers 2, 3, 4, 5, 6,  7, 8, 9, 10, 11, 12, 13 ,14, 15 were then removed with a series of forceps to include a 150 forceps and a 53R and 53L forceps without complications. Alveoloplasty was then performed utilizing a ronguers and bone file. The surgical site was then irrigated with  copious amounts of sterile saline. The tissues were approximated and trimmed appropriately. The surgical site was then closed from the maxillary right tuberosity and extended the mesial #8 utilizing 3-0 chromic gut suture in a continuous interrupted suture technique x1. The maxillary left surgical site was then closed from the maxillary left tuberosity and extended the mesial #9 utilizing 3-0 chromic gut suture in a continuous interrupted suture technique x1. 1 individual interrupted suture was then placed at further closed surgical site as indicated.  At this point time, the mandibular quadrants were approached. The patient was given bilateral inferior alveolar nerve blocks and long buccal nerve blocks utilizing the bupivacaine with epinephrine. Further infiltration was then achieved utilizing the lidocaine with epinephrine. A 15 blade incision was then made from the distal of number of #17 and extended to the mesial numbers 18. 15 blade incision was then made from the distal of #20 and extended to the distal of #32.  A surgical flap was then carefully reflected. Appropriate amounts of buccal and interseptal bone were then removed appropriately. Tooth numbers 17, 20, 21, 22, 23, 24, 25, 26, 27, 28, 31, and 32 were then removed utilizing a series of forceps to include a 151 forceps and 23 forceps without complications. Alveoloplasty was then performed utilizing a rongeurs and bone file. The tissues were approximated and trimmed appropriately. At this point time, the mandibular left torus was noted and removed with a surgical handpiece and bur and copious amounts sterile saline. Further alveoloplasty was performed as indicated. The surgical sites were then irrigated with copious amounts of sterile saline. The surgical site was then closed from the distal of #17 and extended to the mesial #18 utilizing 3-0 chromic gut suture in a continuous interruped suture technique x1. The surgical site was then further closed  from the distal of #20 and extended the mesial #24 utilizing 3-0 chromic gut suture in a continuous interrupted suture technique x1. A mandibular right surgical site was then closed from the distal of #32 and extended the mesial #25 utilizing 3-0 chromic gut suture in a continuous interrupted suture technique x1.   At this point time, the entire mouth was irrigated with copious amounts of sterile saline. The patient was exam for complications, seeing none, the dental medicine procedure was deemed to be complete. The throat pack was removed at this time. A series of 4 x 4 gauze were placed in the mouth to aid hemostasis. The patient was then handed over to the anesthesia team for final disposition. After an appropriate amount of time, the patient was extubated and taken to the postanesthsia care unit with stable vital signs and a good condition. All counts were correct for the dental medicine procedure.   Charlynne Pander, DDS.

## 2011-03-28 NOTE — Progress Notes (Signed)
INterval History: 51 year old male with PMH Multiple myeloma, IgG lambda wth initiate IgG lambda spike 5.1 g/dL on [name] Revlimid dexamethasone acyclovir status post bone marrow biopsy 1/15.  Admitted with pathological fractures in the setting of multiple myeloma with  poor dentition requiring teeth removal on 1.29. On bactrim.  Antibiotics: Bactrim 1.30.2013 Cefazolin one dose 1.30.2013  Subjective:  Patient still in pain. Objective: Filed Vitals:   03/28/11 1015 03/28/11 1030 03/28/11 1045 03/28/11 1352  BP: 152/95 140/83 134/87 139/84  Pulse: 84 83  87  Temp:  98.4 F (36.9 C) 98.2 F (36.8 C) 97.7 F (36.5 C)  TempSrc:    Axillary  Resp: 9 9 14 16   Height:      Weight:      SpO2: 97% 97%  98%   Weight change:   Intake/Output Summary (Last 24 hours) at 03/28/11 1713 Last data filed at 03/28/11 1325  Gross per 24 hour  Intake 1447.11 ml  Output   2520 ml  Net -1072.89 ml    General: Alert, awake, oriented x3, in no acute distress.  HEENT: with mouth fool of gauzes Heart: Regular rate and rhythm, without murmurs, rubs, gallops.  Lungs: Crackles left side, bilateral air movement.  Abdomen: Soft, nontender, nondistended, positive bowel sounds.  Neuro: Grossly intact, nonfocal.   Lab Results:  Basename 03/27/11 0315 03/26/11 0420  NA 128* 133*  K 4.4 3.8  CL 96 99  CO2 27 27  GLUCOSE 120* 105*  BUN 15 15  CREATININE 0.99 1.15  CALCIUM 10.3 10.2  MG -- --  PHOS -- --   No results found for this basename: AST:2,ALT:2,ALKPHOS:2,BILITOT:2,PROT:2,ALBUMIN:2 in the last 72 hours No results found for this basename: LIPASE:2,AMYLASE:2 in the last 72 hours  Basename 03/27/11 0315 03/26/11 0420  WBC 3.8* 2.8*  NEUTROABS -- 1.9  HGB 9.1* 9.6*  HCT 28.1* 30.2*  MCV 82.9 83.0  PLT 210 213   No results found for this basename: CKTOTAL:3,CKMB:3,CKMBINDEX:3,TROPONINI:3 in the last 72 hours No components found with this basename: POCBNP:3 No results found for this  basename: DDIMER:2 in the last 72 hours No results found for this basename: HGBA1C:2 in the last 72 hours No results found for this basename: CHOL:2,HDL:2,LDLCALC:2,TRIG:2,CHOLHDL:2,LDLDIRECT:2 in the last 72 hours No results found for this basename: TSH,T4TOTAL,FREET3,T3FREE,THYROIDAB in the last 72 hours No results found for this basename: VITAMINB12:2,FOLATE:2,FERRITIN:2,TIBC:2,IRON:2,RETICCTPCT:2 in the last 72 hours  Micro Results: No results found for this or any previous visit (from the past 240 hour(s)).  Studies/Results: Ir Kyphoplasty Or Sacroplasty  03/28/2011  *RADIOLOGY REPORT*  Clinical Data:  History of multiple myeloma.  Severe thoracolumbar pain secondary to new compression fractures at T12, L1, L2 and L3.  LUMBAR KYPHOPLASTY AT L1, L2 AND L3, AND KYPHOPLASTY AT T12  Comparison:  MRI scan of the thoracolumbar spine of 03/26/2011.  Following a full explanation of the procedure along with the potential associated complications, an informed witnessed consent was obtained.  The patient was placed prone on the fluoroscopic table.  The skin overlying the thoracolumbar region was then prepped and draped in the usual sterile fashion.  The left pedicle at T12, the right pedicle at L1, the right pedicle at L2 and the left pedicle at L3 were then infiltrated with 0.25% bupivacaine.  Using biplane intermittent fluoroscopy, 11-gauge Jamshidi needles were then advanced into the posterior one-thirds of T12, L1, L2 and L3.  Using biplane intermittent fluoroscopy, these were then exchanged out for the Kyphon advanced osteo introducer system comprised of  a working cannula and a Kyphon osteo drill over a Kyphon osteo bone pin at T12, L1, L2 and L3.  The bone pins were then removed at all the levels.  Using biplane intermittent fluoroscopy, in a  medial trajectory, the combinations were then advanced until the tips of the Kyphon working cannulae were in the anterior third at all levels.  Crossing of the  midline by the Kyphon osteo drills was seen at all the levels.  The Kyphon osteo drills were then removed and core samples from all the levels were sent in separate vials for pathologic analysis.  Through the working cannulae, a Kyphon bone biopsy device was then advanced through each of the working cannulae.  Samples from these were also sent for pathologic analysis.  At this time, Kyphon inflatable bone tamps 20 x 3 were then advanced through each of the needles until the tips of the distal markers were within 5 mm of the anterior aspect of the anterior borders at T12, L1, L2 and L3.  At each of the levels, the Kyphon inflatable tamps were then expanded with contrast via microtubing connected to the Kyphon inflation syringe devices.  Inflations were continued until there was apposition with the superior and the inferior endplate at L1, the inferior endplate at T12, and the inferior endplate at L2 and L3.  Methylmethacrylate mixture was then reconstituted with Tobramycin in the Kyphon bone mixing system.  This was then loaded onto Kyphon bone fillers.  The Kyphon inflatable bone tamps were then deflated and removed.  At L1, 1.5 bone filler equivalents of methylmethacrylate mixture was injected; at  T12, 3 bone filler equivalents of methylmethacrylate mixture was injected, at L3 and at L2, 3 bone filler equivalents of methylmethacrylate mixture was injected.  At each of the levels, excellent filling was obtained in the AP and lateral projections.  There was no extrusion noted into the adjacent disc spaces or posteriorly into the spinal canal.  No paraspinous venous contamination was seen at any of the levels.  The bone fillers and the working cannulae were then removed at each of the levels.  Hemostasis was achieved at the skin entry sites.  The patient tolerated the procedure well.  There were no acute complications.  Medications utilized:  Versed 5 mg IV.  Fentanyl 150 mcg IV. Dilaudid 2 mg IV.  IMPRESSION: 1.   Status post fluoroscopic-guided needle placement for deep core bone biopsy at T12, L-1, L-2 and L3. 2. Status post vertebral body augmentation for painful pathologic compression fractures at T12, L1, L2 and L3 using the balloon kyphoplasty technique.  Original Report Authenticated By: Oneal Grout, M.D.   Ir Kyphoplasty Or Sacroplasty  03/28/2011  *RADIOLOGY REPORT*  Clinical Data:  History of multiple myeloma.  Severe thoracolumbar pain secondary to new compression fractures at T12, L1, L2 and L3.  LUMBAR KYPHOPLASTY AT L1, L2 AND L3, AND KYPHOPLASTY AT T12  Comparison:  MRI scan of the thoracolumbar spine of 03/26/2011.  Following a full explanation of the procedure along with the potential associated complications, an informed witnessed consent was obtained.  The patient was placed prone on the fluoroscopic table.  The skin overlying the thoracolumbar region was then prepped and draped in the usual sterile fashion.  The left pedicle at T12, the right pedicle at L1, the right pedicle at L2 and the left pedicle at L3 were then infiltrated with 0.25% bupivacaine.  Using biplane intermittent fluoroscopy, 11-gauge Jamshidi needles were then advanced into the posterior one-thirds  of T12, L1, L2 and L3.  Using biplane intermittent fluoroscopy, these were then exchanged out for the Kyphon advanced osteo introducer system comprised of a working cannula and a Kyphon osteo drill over a Kyphon osteo bone pin at T12, L1, L2 and L3.  The bone pins were then removed at all the levels.  Using biplane intermittent fluoroscopy, in a  medial trajectory, the combinations were then advanced until the tips of the Kyphon working cannulae were in the anterior third at all levels.  Crossing of the midline by the Kyphon osteo drills was seen at all the levels.  The Kyphon osteo drills were then removed and core samples from all the levels were sent in separate vials for pathologic analysis.  Through the working cannulae, a  Kyphon bone biopsy device was then advanced through each of the working cannulae.  Samples from these were also sent for pathologic analysis.  At this time, Kyphon inflatable bone tamps 20 x 3 were then advanced through each of the needles until the tips of the distal markers were within 5 mm of the anterior aspect of the anterior borders at T12, L1, L2 and L3.  At each of the levels, the Kyphon inflatable tamps were then expanded with contrast via microtubing connected to the Kyphon inflation syringe devices.  Inflations were continued until there was apposition with the superior and the inferior endplate at L1, the inferior endplate at T12, and the inferior endplate at L2 and L3.  Methylmethacrylate mixture was then reconstituted with Tobramycin in the Kyphon bone mixing system.  This was then loaded onto Kyphon bone fillers.  The Kyphon inflatable bone tamps were then deflated and removed.  At L1, 1.5 bone filler equivalents of methylmethacrylate mixture was injected; at  T12, 3 bone filler equivalents of methylmethacrylate mixture was injected, at L3 and at L2, 3 bone filler equivalents of methylmethacrylate mixture was injected.  At each of the levels, excellent filling was obtained in the AP and lateral projections.  There was no extrusion noted into the adjacent disc spaces or posteriorly into the spinal canal.  No paraspinous venous contamination was seen at any of the levels.  The bone fillers and the working cannulae were then removed at each of the levels.  Hemostasis was achieved at the skin entry sites.  The patient tolerated the procedure well.  There were no acute complications.  Medications utilized:  Versed 5 mg IV.  Fentanyl 150 mcg IV. Dilaudid 2 mg IV.  IMPRESSION: 1.  Status post fluoroscopic-guided needle placement for deep core bone biopsy at T12, L-1, L-2 and L3. 2. Status post vertebral body augmentation for painful pathologic compression fractures at T12, L1, L2 and L3 using the balloon  kyphoplasty technique.  Original Report Authenticated By: Oneal Grout, M.D.   Ir Kyphoplasty Or Sacroplasty  03/28/2011  *RADIOLOGY REPORT*  Clinical Data:  History of multiple myeloma.  Severe thoracolumbar pain secondary to new compression fractures at T12, L1, L2 and L3.  LUMBAR KYPHOPLASTY AT L1, L2 AND L3, AND KYPHOPLASTY AT T12  Comparison:  MRI scan of the thoracolumbar spine of 03/26/2011.  Following a full explanation of the procedure along with the potential associated complications, an informed witnessed consent was obtained.  The patient was placed prone on the fluoroscopic table.  The skin overlying the thoracolumbar region was then prepped and draped in the usual sterile fashion.  The left pedicle at T12, the right pedicle at L1, the right pedicle at L2 and the  left pedicle at L3 were then infiltrated with 0.25% bupivacaine.  Using biplane intermittent fluoroscopy, 11-gauge Jamshidi needles were then advanced into the posterior one-thirds of T12, L1, L2 and L3.  Using biplane intermittent fluoroscopy, these were then exchanged out for the Kyphon advanced osteo introducer system comprised of a working cannula and a Kyphon osteo drill over a Kyphon osteo bone pin at T12, L1, L2 and L3.  The bone pins were then removed at all the levels.  Using biplane intermittent fluoroscopy, in a  medial trajectory, the combinations were then advanced until the tips of the Kyphon working cannulae were in the anterior third at all levels.  Crossing of the midline by the Kyphon osteo drills was seen at all the levels.  The Kyphon osteo drills were then removed and core samples from all the levels were sent in separate vials for pathologic analysis.  Through the working cannulae, a Kyphon bone biopsy device was then advanced through each of the working cannulae.  Samples from these were also sent for pathologic analysis.  At this time, Kyphon inflatable bone tamps 20 x 3 were then advanced through each of the  needles until the tips of the distal markers were within 5 mm of the anterior aspect of the anterior borders at T12, L1, L2 and L3.  At each of the levels, the Kyphon inflatable tamps were then expanded with contrast via microtubing connected to the Kyphon inflation syringe devices.  Inflations were continued until there was apposition with the superior and the inferior endplate at L1, the inferior endplate at T12, and the inferior endplate at L2 and L3.  Methylmethacrylate mixture was then reconstituted with Tobramycin in the Kyphon bone mixing system.  This was then loaded onto Kyphon bone fillers.  The Kyphon inflatable bone tamps were then deflated and removed.  At L1, 1.5 bone filler equivalents of methylmethacrylate mixture was injected; at  T12, 3 bone filler equivalents of methylmethacrylate mixture was injected, at L3 and at L2, 3 bone filler equivalents of methylmethacrylate mixture was injected.  At each of the levels, excellent filling was obtained in the AP and lateral projections.  There was no extrusion noted into the adjacent disc spaces or posteriorly into the spinal canal.  No paraspinous venous contamination was seen at any of the levels.  The bone fillers and the working cannulae were then removed at each of the levels.  Hemostasis was achieved at the skin entry sites.  The patient tolerated the procedure well.  There were no acute complications.  Medications utilized:  Versed 5 mg IV.  Fentanyl 150 mcg IV. Dilaudid 2 mg IV.  IMPRESSION: 1.  Status post fluoroscopic-guided needle placement for deep core bone biopsy at T12, L-1, L-2 and L3. 2. Status post vertebral body augmentation for painful pathologic compression fractures at T12, L1, L2 and L3 using the balloon kyphoplasty technique.  Original Report Authenticated By: Oneal Grout, M.D.   Ir Kyphoplasty Or Sacroplasty  03/28/2011  *RADIOLOGY REPORT*  Clinical Data:  History of multiple myeloma.  Severe thoracolumbar pain secondary  to new compression fractures at T12, L1, L2 and L3.  LUMBAR KYPHOPLASTY AT L1, L2 AND L3, AND KYPHOPLASTY AT T12  Comparison:  MRI scan of the thoracolumbar spine of 03/26/2011.  Following a full explanation of the procedure along with the potential associated complications, an informed witnessed consent was obtained.  The patient was placed prone on the fluoroscopic table.  The skin overlying the thoracolumbar region was then prepped  and draped in the usual sterile fashion.  The left pedicle at T12, the right pedicle at L1, the right pedicle at L2 and the left pedicle at L3 were then infiltrated with 0.25% bupivacaine.  Using biplane intermittent fluoroscopy, 11-gauge Jamshidi needles were then advanced into the posterior one-thirds of T12, L1, L2 and L3.  Using biplane intermittent fluoroscopy, these were then exchanged out for the Kyphon advanced osteo introducer system comprised of a working cannula and a Kyphon osteo drill over a Kyphon osteo bone pin at T12, L1, L2 and L3.  The bone pins were then removed at all the levels.  Using biplane intermittent fluoroscopy, in a  medial trajectory, the combinations were then advanced until the tips of the Kyphon working cannulae were in the anterior third at all levels.  Crossing of the midline by the Kyphon osteo drills was seen at all the levels.  The Kyphon osteo drills were then removed and core samples from all the levels were sent in separate vials for pathologic analysis.  Through the working cannulae, a Kyphon bone biopsy device was then advanced through each of the working cannulae.  Samples from these were also sent for pathologic analysis.  At this time, Kyphon inflatable bone tamps 20 x 3 were then advanced through each of the needles until the tips of the distal markers were within 5 mm of the anterior aspect of the anterior borders at T12, L1, L2 and L3.  At each of the levels, the Kyphon inflatable tamps were then expanded with contrast via microtubing  connected to the Kyphon inflation syringe devices.  Inflations were continued until there was apposition with the superior and the inferior endplate at L1, the inferior endplate at T12, and the inferior endplate at L2 and L3.  Methylmethacrylate mixture was then reconstituted with Tobramycin in the Kyphon bone mixing system.  This was then loaded onto Kyphon bone fillers.  The Kyphon inflatable bone tamps were then deflated and removed.  At L1, 1.5 bone filler equivalents of methylmethacrylate mixture was injected; at  T12, 3 bone filler equivalents of methylmethacrylate mixture was injected, at L3 and at L2, 3 bone filler equivalents of methylmethacrylate mixture was injected.  At each of the levels, excellent filling was obtained in the AP and lateral projections.  There was no extrusion noted into the adjacent disc spaces or posteriorly into the spinal canal.  No paraspinous venous contamination was seen at any of the levels.  The bone fillers and the working cannulae were then removed at each of the levels.  Hemostasis was achieved at the skin entry sites.  The patient tolerated the procedure well.  There were no acute complications.  Medications utilized:  Versed 5 mg IV.  Fentanyl 150 mcg IV. Dilaudid 2 mg IV.  IMPRESSION: 1.  Status post fluoroscopic-guided needle placement for deep core bone biopsy at T12, L-1, L-2 and L3. 2. Status post vertebral body augmentation for painful pathologic compression fractures at T12, L1, L2 and L3 using the balloon kyphoplasty technique.  Original Report Authenticated By: Oneal Grout, M.D.    Medications: I have reviewed the patient's current medications.  Assessment and plan:  1. Chronic periodontitis: -S/p removal of multiple teeth. On bactrim. Afebrile. I will increase narcotics.  2. Chronic apical periodontitis: -See above.  3.Multiple myeloma: -Continue current treatment per oncology.  4. Anemia:   -HBg stable.  5. Hyponatremia: -I will  increase IV fluids, check a basic metabolic panel in am. This is most likely  secondarily to decrease PO intake.    LOS: 2 days   Marinda Elk M.D. Pager: [phone] [facility] 03/28/2011, 5:13 PM

## 2011-03-29 ENCOUNTER — Encounter (HOSPITAL_COMMUNITY): Payer: Self-pay | Admitting: Dentistry

## 2011-03-29 ENCOUNTER — Other Ambulatory Visit (HOSPITAL_COMMUNITY): Payer: Self-pay

## 2011-03-29 ENCOUNTER — Ambulatory Visit: Payer: No Typology Code available for payment source

## 2011-03-29 ENCOUNTER — Inpatient Hospital Stay (HOSPITAL_COMMUNITY): Admission: RE | Admit: 2011-03-29 | Payer: Medicaid Other | Source: Ambulatory Visit

## 2011-03-29 DIAGNOSIS — K08109 Complete loss of teeth, unspecified cause, unspecified class: Secondary | ICD-10-CM

## 2011-03-29 DIAGNOSIS — IMO0002 Reserved for concepts with insufficient information to code with codable children: Secondary | ICD-10-CM

## 2011-03-29 LAB — DIFFERENTIAL
Basophils Absolute: 0 10*3/uL (ref 0.0–0.1)
Eosinophils Relative: 2 % (ref 0–5)
Lymphocytes Relative: 12 % (ref 12–46)
Lymphs Abs: 0.3 10*3/uL — ABNORMAL LOW (ref 0.7–4.0)
Monocytes Absolute: 0 10*3/uL — ABNORMAL LOW (ref 0.1–1.0)
Neutro Abs: 2.4 10*3/uL (ref 1.7–7.7)

## 2011-03-29 LAB — BASIC METABOLIC PANEL
CO2: 26 mEq/L (ref 19–32)
Chloride: 100 mEq/L (ref 96–112)
GFR calc Af Amer: >90 mL/min (ref >90)
Potassium: 3.7 mEq/L (ref 3.5–5.1)
Sodium: 131 mEq/L — ABNORMAL LOW (ref 135–145)

## 2011-03-29 LAB — CBC
HCT: 26.8 % — ABNORMAL LOW (ref 39.0–52.0)
MCV: 82.5 fL (ref 78.0–100.0)
RBC: 3.25 MIL/uL — ABNORMAL LOW (ref 4.22–5.81)
RDW: 16.9 % — ABNORMAL HIGH (ref 11.5–15.5)
WBC: 2.7 10*3/uL — ABNORMAL LOW (ref 4.0–10.5)

## 2011-03-29 MED ORDER — HYDROMORPHONE 0.3 MG/ML IV SOLN
INTRAVENOUS | Status: DC
  Administered 2011-03-29: 1.29 mg via INTRAVENOUS
  Administered 2011-03-29: 11:00:00 via INTRAVENOUS
  Administered 2011-03-29: 1.19 mg via INTRAVENOUS
  Administered 2011-03-30: 2.97 mg via INTRAVENOUS
  Administered 2011-03-30: 1.19 mg via INTRAVENOUS
  Administered 2011-03-30: 04:00:00 via INTRAVENOUS
  Administered 2011-03-30: 2.99 mg via INTRAVENOUS
  Administered 2011-03-30: 1.39 mg via INTRAVENOUS
  Filled 2011-03-29 (×2): qty 25

## 2011-03-29 MED ORDER — BORTEZOMIB CHEMO SQ INJECTION 3.5 MG (2.5MG/ML)
1.3000 mg/m2 | Freq: Once | INTRAMUSCULAR | Status: AC
  Administered 2011-03-29: 2.5 mg via SUBCUTANEOUS
  Filled 2011-03-29: qty 1

## 2011-03-29 MED ORDER — ONDANSETRON HCL 4 MG/2ML IJ SOLN: 4.0000 mg | Freq: Four times a day (QID) | INTRAMUSCULAR | Status: DC | PRN

## 2011-03-29 MED ORDER — DIPHENHYDRAMINE HCL 50 MG/ML IJ SOLN: 12.5000 mg | Freq: Four times a day (QID) | INTRAMUSCULAR | Status: DC | PRN

## 2011-03-29 MED ORDER — DIPHENHYDRAMINE HCL 12.5 MG/5ML PO ELIX: 12.5000 mg | ORAL_SOLUTION | Freq: Four times a day (QID) | ORAL | Status: DC | PRN

## 2011-03-29 MED ORDER — SODIUM CHLORIDE 0.9 % IJ SOLN
9.0000 mL | INTRAMUSCULAR | Status: DC | PRN
  Administered 2011-04-02: 9 mL via INTRAVENOUS

## 2011-03-29 MED ORDER — ENOXAPARIN SODIUM 40 MG/0.4ML ~~LOC~~ SOLN
40.0000 mg | Freq: Every day | SUBCUTANEOUS | Status: DC
  Administered 2011-03-29 – 2011-03-31 (×3): 40 mg via SUBCUTANEOUS
  Filled 2011-03-29 (×3): qty 0.4

## 2011-03-29 MED ORDER — NALOXONE HCL 0.4 MG/ML IJ SOLN: 0.4000 mg | INTRAMUSCULAR | Status: DC | PRN

## 2011-03-29 MED ORDER — ONDANSETRON HCL 8 MG PO TABS
8.0000 mg | ORAL_TABLET | Freq: Once | ORAL | Status: AC
  Administered 2011-03-29: 8 mg via ORAL
  Filled 2011-03-29: qty 1

## 2011-03-29 NOTE — Consult Note (Signed)
[facility] INPATIENT PROGRESS NOTE  Name: Shane Woodard      MRN: [id number]    Location: 1313/1313-01  Date: 03/29/2011 Time:10:03 AM  DIAGNOSIS: IgG lambda multiple myeloma; presented with anemia, and bone pain. Initial M-spike was 5.1gm/dL; free serum lambda of 7.82 mg/dL; Ig G 7760 mg/dL; NFAO-1-[id number] of 2.47. Bone marrow biopsy was performed today; result pending including FISH and cytogenetics.   CURRENT THERAPY: started on 03/19/11 [name] 1.3mg /m2 SQ d1, 4,8,11 q28 day; Revlimid 25 mg PO d1-21 q28day; Dexamethasone 40mg  PO weekly (even of week off of chemo). He is also on Acyclovir 400mg  PO BID; Bactrim DS Mon/Wed/Fri; Lovenox 40mg  SQ daily. Zometa is due to start after he is cleared by dentist.  Subjective: Interval History:Gar Earnshaw had kyphoplasty two days ago. He still has bilateral lower back pain.  He thinks that the pain level now is 70% prior to kyphoplasty.  He also has dental extraction of all his remaining teeth yesterday and has moderate/severe gum pain.  He has had skin rash on his trunk ever since he was started on MS Contin.  He is having a hard time coming up with the name of MS Contin.  Before MS Contin, he was on Percocet and even started on Revlimid without rash.  He thinks that the rash came on since this past weekend 03/23/10 and the only new med since then was MS Contin.  The rash does not bother him; it does not itch.       Objective: Vital signs in last 24 hours: Temp:  [97.1 F (36.2 C)-98.4 F (36.9 C)] 97.8 F (36.6 C) (01/31 0600) Pulse Rate:  [83-141] 141  (01/31 0600) Resp:  [9-18] 18  (01/31 0600) BP: (128-152)/(63-95) 128/63 mmHg (01/31 0600) SpO2:  [81 %-98 %] 92 % (01/31 0836)    Intake/Output from previous day: 01/30 0701 - 01/31 0700 In: 2793.8 [I.V.:2793.8] Out: 2090 [Urine:2065]    PHYSICAL EXAM:  General: well-nourished in pain.   Eyes: no scleral icterus. ENT: packing post dental extraction without brisk bleeding.  Neck  was without thyromegaly. Lymphatics: Negative cervical, supraclavicular or axillary adenopathy. Respiratory: lungs were clear bilaterally without wheezing or crackles. Cardiovascular: Regular rate and rhythm, S1/S2, without murmur, rub or gallop. There was no pedal edema. GI: abdomen was soft, flat, nontender, nondistended, without organomegaly. Muscoloskeletal: in brace.   Skin exam showed diffuse, confluent rash in the chest/abdomen, and arms.   Studies/Results: Results for orders placed during the hospital encounter of 03/26/11 (from the past 48 hour(s))  BASIC METABOLIC PANEL     Status: Abnormal   Collection Time   03/29/11  3:54 AM      Component Value Range Comment   Sodium 131 (*) 135 - 145 (mEq/L)    Potassium 3.7  3.5 - 5.1 (mEq/L)    Chloride 100  96 - 112 (mEq/L)    CO2 26  19 - 32 (mEq/L)    Glucose, Bld 104 (*) 70 - 99 (mg/dL)    BUN 22  6 - 23 (mg/dL)    Creatinine, Ser 5.28  0.50 - 1.35 (mg/dL)    Calcium 9.5  8.4 - 10.5 (mg/dL)    GFR calc non Af Amer >90  >90 (mL/min)    GFR calc Af Amer >90  >90 (mL/min)   CBC     Status: Abnormal   Collection Time   03/29/11  7:46 AM      Component Value Range Comment   WBC 2.7 (*)  4.0 - 10.5 (K/uL)    RBC 3.25 (*) 4.22 - 5.81 (MIL/uL)    Hemoglobin 8.5 (*) 13.0 - 17.0 (g/dL)    HCT 91.4 (*) 78.2 - 52.0 (%)    MCV 82.5  78.0 - 100.0 (fL)    MCH 26.2  26.0 - 34.0 (pg)    MCHC 31.7  30.0 - 36.0 (g/dL)    RDW 95.6 (*) 21.3 - 15.5 (%)    Platelets 116 (*) 150 - 400 (K/uL)   DIFFERENTIAL     Status: Abnormal   Collection Time   03/29/11  7:46 AM      Component Value Range Comment   Neutrophils Relative 84 (*) 43 - 77 (%)    Neutro Abs 2.4  1.7 - 7.7 (K/uL)    Lymphocytes Relative 12  12 - 46 (%)    Lymphs Abs 0.3 (*) 0.7 - 4.0 (K/uL)    Monocytes Relative 1 (*) 3 - 12 (%)    Monocytes Absolute 0.0 (*) 0.1 - 1.0 (K/uL)    Eosinophils Relative 2  0 - 5 (%)    Eosinophils Absolute 0.1  0.0 - 0.7 (K/uL)    Basophils Relative 0  0 -  1 (%)    Basophils Absolute 0.0  0.0 - 0.1 (K/uL)     MEDICATIONS: reviewed.    Assessment/Plan:  1. IgG lambda multiple myeloma:  - continue with daily Revlimid 25mg  PO daily (21 days on, 7 days of; started on 03/21/11); Monday and Thursday SQ [name] (next dose of [name] SQ is due today Thu 03/29/11 which will be day 11, cycle 1), weekly Dexamethasone 40mg  PO qMondays.   He is on prophylactic Acyclovir, Bactrim while on chemo.  I discussed with Dr. Kristin Bruins today and my exam of his gum did not show bleeding.  I will resume Lovenox again at 40mg  SQ daily since he is at high risk of DVT.  - Zometa will be delayed for about 2-3 wks to allow healing from dental extraction today.  - He will see [facility] soon to discuss role of BMT.   2.  Compression fracture: s/p kyphoplasty yesterday with only residual relief of pain.  He has been on Fentanyl patch, and breakthrough with dilaudid, morphine, percocet.  Still he has only 30% reduction of back pain from kyphoplasty.  I discussed with Dr. Katherine Mantle today and agreed with plan to d/c all current pain meds; and start on Dilaudid PCA to assess his analgesic requirement.   Rad Onc may consider starting radiation to fracture when Dr. Basilio Cairo thinks that timing is appropriate.  Hopefully, that will also improve his pain.   3.  Skin rash:  Multiple possible causes:  MS Contin (which was d/c yesterday).  Cannot rule out [name] or Revlimide causing skin rash but since these meds are very important for this patient's treatment, and no pruritus.  I reccommended for patient to continue [name] and Revlimide for now.   FULL CODE.

## 2011-03-29 NOTE — Progress Notes (Signed)
INterval History: 51 year old male with PMH Multiple myeloma, IgG lambda wth initiate IgG lambda spike 5.1 g/dL on [name] Revlimid dexamethasone acyclovir status post bone marrow biopsy 1/15.  Admitted with pathological fractures in the setting of multiple myeloma with  poor dentition requiring teeth removal on 1.29. On bactrim.  Antibiotics: Bactrim 1.30.2013 one dose. Cefazolin one dose 1.30.2013>>  Subjective:  Patient still in pain, fentanyl patch, morphine and percoset not working. Objective: Filed Vitals:   03/28/11 2159 03/29/11 0222 03/29/11 0600 03/29/11 0836  BP: 142/80 139/94 128/63   Pulse: 86 136 141   Temp: 98.1 F (36.7 C) 97.1 F (36.2 C) 97.8 F (36.6 C)   TempSrc: Oral Oral Oral   Resp: 16 16 18    Height:      Weight:      SpO2: 92% 81% 97% 92%   Weight change:   Intake/Output Summary (Last 24 hours) at 03/29/11 0948 Last data filed at 03/29/11 0847  Gross per 24 hour  Intake 1993.78 ml  Output   2290 ml  Net -296.22 ml    General: Alert, awake, oriented x3, in no acute distress.  HEENT: with mouth fool of gauzes Heart: Regular rate and rhythm, without murmurs, rubs, gallops.  Lungs: Crackles left side, bilateral air movement.  Abdomen: Soft, nontender, nondistended, positive bowel sounds.  Neuro: Grossly intact, nonfocal.   Lab Results:  Basename 03/29/11 0354 03/27/11 0315  NA 131* 128*  K 3.7 4.4  CL 100 96  CO2 26 27  GLUCOSE 104* 120*  BUN 22 15  CREATININE 0.98 0.99  CALCIUM 9.5 10.3  MG -- --  PHOS -- --   No results found for this basename: AST:2,ALT:2,ALKPHOS:2,BILITOT:2,PROT:2,ALBUMIN:2 in the last 72 hours No results found for this basename: LIPASE:2,AMYLASE:2 in the last 72 hours  Basename 03/29/11 0746 03/27/11 0315  WBC 2.7* 3.8*  NEUTROABS 2.4 --  HGB 8.5* 9.1*  HCT 26.8* 28.1*  MCV 82.5 82.9  PLT 116* 210   No results found for this basename: CKTOTAL:3,CKMB:3,CKMBINDEX:3,TROPONINI:3 in the last 72 hours No  components found with this basename: POCBNP:3 No results found for this basename: DDIMER:2 in the last 72 hours No results found for this basename: HGBA1C:2 in the last 72 hours No results found for this basename: CHOL:2,HDL:2,LDLCALC:2,TRIG:2,CHOLHDL:2,LDLDIRECT:2 in the last 72 hours No results found for this basename: TSH,T4TOTAL,FREET3,T3FREE,THYROIDAB in the last 72 hours No results found for this basename: VITAMINB12:2,FOLATE:2,FERRITIN:2,TIBC:2,IRON:2,RETICCTPCT:2 in the last 72 hours  Micro Results: No results found for this or any previous visit (from the past 240 hour(s)).  Studies/Results: Ir Kyphoplasty Or Sacroplasty  03/28/2011  *RADIOLOGY REPORT*  Clinical Data:  History of multiple myeloma.  Severe thoracolumbar pain secondary to new compression fractures at T12, L1, L2 and L3.  LUMBAR KYPHOPLASTY AT L1, L2 AND L3, AND KYPHOPLASTY AT T12  Comparison:  MRI scan of the thoracolumbar spine of 03/26/2011.  Following a full explanation of the procedure along with the potential associated complications, an informed witnessed consent was obtained.  The patient was placed prone on the fluoroscopic table.  The skin overlying the thoracolumbar region was then prepped and draped in the usual sterile fashion.  The left pedicle at T12, the right pedicle at L1, the right pedicle at L2 and the left pedicle at L3 were then infiltrated with 0.25% bupivacaine.  Using biplane intermittent fluoroscopy, 11-gauge Jamshidi needles were then advanced into the posterior one-thirds of T12, L1, L2 and L3.  Using biplane intermittent fluoroscopy, these were then exchanged out  for the Kyphon advanced osteo introducer system comprised of a working cannula and a Kyphon osteo drill over a Kyphon osteo bone pin at T12, L1, L2 and L3.  The bone pins were then removed at all the levels.  Using biplane intermittent fluoroscopy, in a  medial trajectory, the combinations were then advanced until the tips of the Kyphon working  cannulae were in the anterior third at all levels.  Crossing of the midline by the Kyphon osteo drills was seen at all the levels.  The Kyphon osteo drills were then removed and core samples from all the levels were sent in separate vials for pathologic analysis.  Through the working cannulae, a Kyphon bone biopsy device was then advanced through each of the working cannulae.  Samples from these were also sent for pathologic analysis.  At this time, Kyphon inflatable bone tamps 20 x 3 were then advanced through each of the needles until the tips of the distal markers were within 5 mm of the anterior aspect of the anterior borders at T12, L1, L2 and L3.  At each of the levels, the Kyphon inflatable tamps were then expanded with contrast via microtubing connected to the Kyphon inflation syringe devices.  Inflations were continued until there was apposition with the superior and the inferior endplate at L1, the inferior endplate at T12, and the inferior endplate at L2 and L3.  Methylmethacrylate mixture was then reconstituted with Tobramycin in the Kyphon bone mixing system.  This was then loaded onto Kyphon bone fillers.  The Kyphon inflatable bone tamps were then deflated and removed.  At L1, 1.5 bone filler equivalents of methylmethacrylate mixture was injected; at  T12, 3 bone filler equivalents of methylmethacrylate mixture was injected, at L3 and at L2, 3 bone filler equivalents of methylmethacrylate mixture was injected.  At each of the levels, excellent filling was obtained in the AP and lateral projections.  There was no extrusion noted into the adjacent disc spaces or posteriorly into the spinal canal.  No paraspinous venous contamination was seen at any of the levels.  The bone fillers and the working cannulae were then removed at each of the levels.  Hemostasis was achieved at the skin entry sites.  The patient tolerated the procedure well.  There were no acute complications.  Medications utilized:  Versed  5 mg IV.  Fentanyl 150 mcg IV. Dilaudid 2 mg IV.  IMPRESSION: 1.  Status post fluoroscopic-guided needle placement for deep core bone biopsy at T12, L-1, L-2 and L3. 2. Status post vertebral body augmentation for painful pathologic compression fractures at T12, L1, L2 and L3 using the balloon kyphoplasty technique.  Original Report Authenticated By: Oneal Grout, M.D.   Ir Kyphoplasty Or Sacroplasty  03/28/2011  *RADIOLOGY REPORT*  Clinical Data:  History of multiple myeloma.  Severe thoracolumbar pain secondary to new compression fractures at T12, L1, L2 and L3.  LUMBAR KYPHOPLASTY AT L1, L2 AND L3, AND KYPHOPLASTY AT T12  Comparison:  MRI scan of the thoracolumbar spine of 03/26/2011.  Following a full explanation of the procedure along with the potential associated complications, an informed witnessed consent was obtained.  The patient was placed prone on the fluoroscopic table.  The skin overlying the thoracolumbar region was then prepped and draped in the usual sterile fashion.  The left pedicle at T12, the right pedicle at L1, the right pedicle at L2 and the left pedicle at L3 were then infiltrated with 0.25% bupivacaine.  Using biplane intermittent fluoroscopy, 11-gauge  Jamshidi needles were then advanced into the posterior one-thirds of T12, L1, L2 and L3.  Using biplane intermittent fluoroscopy, these were then exchanged out for the Kyphon advanced osteo introducer system comprised of a working cannula and a Kyphon osteo drill over a Kyphon osteo bone pin at T12, L1, L2 and L3.  The bone pins were then removed at all the levels.  Using biplane intermittent fluoroscopy, in a  medial trajectory, the combinations were then advanced until the tips of the Kyphon working cannulae were in the anterior third at all levels.  Crossing of the midline by the Kyphon osteo drills was seen at all the levels.  The Kyphon osteo drills were then removed and core samples from all the levels were sent in separate  vials for pathologic analysis.  Through the working cannulae, a Kyphon bone biopsy device was then advanced through each of the working cannulae.  Samples from these were also sent for pathologic analysis.  At this time, Kyphon inflatable bone tamps 20 x 3 were then advanced through each of the needles until the tips of the distal markers were within 5 mm of the anterior aspect of the anterior borders at T12, L1, L2 and L3.  At each of the levels, the Kyphon inflatable tamps were then expanded with contrast via microtubing connected to the Kyphon inflation syringe devices.  Inflations were continued until there was apposition with the superior and the inferior endplate at L1, the inferior endplate at T12, and the inferior endplate at L2 and L3.  Methylmethacrylate mixture was then reconstituted with Tobramycin in the Kyphon bone mixing system.  This was then loaded onto Kyphon bone fillers.  The Kyphon inflatable bone tamps were then deflated and removed.  At L1, 1.5 bone filler equivalents of methylmethacrylate mixture was injected; at  T12, 3 bone filler equivalents of methylmethacrylate mixture was injected, at L3 and at L2, 3 bone filler equivalents of methylmethacrylate mixture was injected.  At each of the levels, excellent filling was obtained in the AP and lateral projections.  There was no extrusion noted into the adjacent disc spaces or posteriorly into the spinal canal.  No paraspinous venous contamination was seen at any of the levels.  The bone fillers and the working cannulae were then removed at each of the levels.  Hemostasis was achieved at the skin entry sites.  The patient tolerated the procedure well.  There were no acute complications.  Medications utilized:  Versed 5 mg IV.  Fentanyl 150 mcg IV. Dilaudid 2 mg IV.  IMPRESSION: 1.  Status post fluoroscopic-guided needle placement for deep core bone biopsy at T12, L-1, L-2 and L3. 2. Status post vertebral body augmentation for painful pathologic  compression fractures at T12, L1, L2 and L3 using the balloon kyphoplasty technique.  Original Report Authenticated By: Oneal Grout, M.D.   Ir Kyphoplasty Or Sacroplasty  03/28/2011  *RADIOLOGY REPORT*  Clinical Data:  History of multiple myeloma.  Severe thoracolumbar pain secondary to new compression fractures at T12, L1, L2 and L3.  LUMBAR KYPHOPLASTY AT L1, L2 AND L3, AND KYPHOPLASTY AT T12  Comparison:  MRI scan of the thoracolumbar spine of 03/26/2011.  Following a full explanation of the procedure along with the potential associated complications, an informed witnessed consent was obtained.  The patient was placed prone on the fluoroscopic table.  The skin overlying the thoracolumbar region was then prepped and draped in the usual sterile fashion.  The left pedicle at T12, the right pedicle  at L1, the right pedicle at L2 and the left pedicle at L3 were then infiltrated with 0.25% bupivacaine.  Using biplane intermittent fluoroscopy, 11-gauge Jamshidi needles were then advanced into the posterior one-thirds of T12, L1, L2 and L3.  Using biplane intermittent fluoroscopy, these were then exchanged out for the Kyphon advanced osteo introducer system comprised of a working cannula and a Kyphon osteo drill over a Kyphon osteo bone pin at T12, L1, L2 and L3.  The bone pins were then removed at all the levels.  Using biplane intermittent fluoroscopy, in a  medial trajectory, the combinations were then advanced until the tips of the Kyphon working cannulae were in the anterior third at all levels.  Crossing of the midline by the Kyphon osteo drills was seen at all the levels.  The Kyphon osteo drills were then removed and core samples from all the levels were sent in separate vials for pathologic analysis.  Through the working cannulae, a Kyphon bone biopsy device was then advanced through each of the working cannulae.  Samples from these were also sent for pathologic analysis.  At this time, Kyphon  inflatable bone tamps 20 x 3 were then advanced through each of the needles until the tips of the distal markers were within 5 mm of the anterior aspect of the anterior borders at T12, L1, L2 and L3.  At each of the levels, the Kyphon inflatable tamps were then expanded with contrast via microtubing connected to the Kyphon inflation syringe devices.  Inflations were continued until there was apposition with the superior and the inferior endplate at L1, the inferior endplate at T12, and the inferior endplate at L2 and L3.  Methylmethacrylate mixture was then reconstituted with Tobramycin in the Kyphon bone mixing system.  This was then loaded onto Kyphon bone fillers.  The Kyphon inflatable bone tamps were then deflated and removed.  At L1, 1.5 bone filler equivalents of methylmethacrylate mixture was injected; at  T12, 3 bone filler equivalents of methylmethacrylate mixture was injected, at L3 and at L2, 3 bone filler equivalents of methylmethacrylate mixture was injected.  At each of the levels, excellent filling was obtained in the AP and lateral projections.  There was no extrusion noted into the adjacent disc spaces or posteriorly into the spinal canal.  No paraspinous venous contamination was seen at any of the levels.  The bone fillers and the working cannulae were then removed at each of the levels.  Hemostasis was achieved at the skin entry sites.  The patient tolerated the procedure well.  There were no acute complications.  Medications utilized:  Versed 5 mg IV.  Fentanyl 150 mcg IV. Dilaudid 2 mg IV.  IMPRESSION: 1.  Status post fluoroscopic-guided needle placement for deep core bone biopsy at T12, L-1, L-2 and L3. 2. Status post vertebral body augmentation for painful pathologic compression fractures at T12, L1, L2 and L3 using the balloon kyphoplasty technique.  Original Report Authenticated By: Oneal Grout, M.D.   Ir Kyphoplasty Or Sacroplasty  03/28/2011  *RADIOLOGY REPORT*  Clinical Data:   History of multiple myeloma.  Severe thoracolumbar pain secondary to new compression fractures at T12, L1, L2 and L3.  LUMBAR KYPHOPLASTY AT L1, L2 AND L3, AND KYPHOPLASTY AT T12  Comparison:  MRI scan of the thoracolumbar spine of 03/26/2011.  Following a full explanation of the procedure along with the potential associated complications, an informed witnessed consent was obtained.  The patient was placed prone on the fluoroscopic table.  The skin overlying the thoracolumbar region was then prepped and draped in the usual sterile fashion.  The left pedicle at T12, the right pedicle at L1, the right pedicle at L2 and the left pedicle at L3 were then infiltrated with 0.25% bupivacaine.  Using biplane intermittent fluoroscopy, 11-gauge Jamshidi needles were then advanced into the posterior one-thirds of T12, L1, L2 and L3.  Using biplane intermittent fluoroscopy, these were then exchanged out for the Kyphon advanced osteo introducer system comprised of a working cannula and a Kyphon osteo drill over a Kyphon osteo bone pin at T12, L1, L2 and L3.  The bone pins were then removed at all the levels.  Using biplane intermittent fluoroscopy, in a  medial trajectory, the combinations were then advanced until the tips of the Kyphon working cannulae were in the anterior third at all levels.  Crossing of the midline by the Kyphon osteo drills was seen at all the levels.  The Kyphon osteo drills were then removed and core samples from all the levels were sent in separate vials for pathologic analysis.  Through the working cannulae, a Kyphon bone biopsy device was then advanced through each of the working cannulae.  Samples from these were also sent for pathologic analysis.  At this time, Kyphon inflatable bone tamps 20 x 3 were then advanced through each of the needles until the tips of the distal markers were within 5 mm of the anterior aspect of the anterior borders at T12, L1, L2 and L3.  At each of the levels, the Kyphon  inflatable tamps were then expanded with contrast via microtubing connected to the Kyphon inflation syringe devices.  Inflations were continued until there was apposition with the superior and the inferior endplate at L1, the inferior endplate at T12, and the inferior endplate at L2 and L3.  Methylmethacrylate mixture was then reconstituted with Tobramycin in the Kyphon bone mixing system.  This was then loaded onto Kyphon bone fillers.  The Kyphon inflatable bone tamps were then deflated and removed.  At L1, 1.5 bone filler equivalents of methylmethacrylate mixture was injected; at  T12, 3 bone filler equivalents of methylmethacrylate mixture was injected, at L3 and at L2, 3 bone filler equivalents of methylmethacrylate mixture was injected.  At each of the levels, excellent filling was obtained in the AP and lateral projections.  There was no extrusion noted into the adjacent disc spaces or posteriorly into the spinal canal.  No paraspinous venous contamination was seen at any of the levels.  The bone fillers and the working cannulae were then removed at each of the levels.  Hemostasis was achieved at the skin entry sites.  The patient tolerated the procedure well.  There were no acute complications.  Medications utilized:  Versed 5 mg IV.  Fentanyl 150 mcg IV. Dilaudid 2 mg IV.  IMPRESSION: 1.  Status post fluoroscopic-guided needle placement for deep core bone biopsy at T12, L-1, L-2 and L3. 2. Status post vertebral body augmentation for painful pathologic compression fractures at T12, L1, L2 and L3 using the balloon kyphoplasty technique.  Original Report Authenticated By: Oneal Grout, M.D.    Medications: I have reviewed the patient's current medications.  Assessment and plan:  1. Chronic periodontitis: -S/p removal of multiple teeth. On bactrim. Afebrile. I will increase narcotics. Continue bactrim started 1.30.2013.  2. Chronic apical periodontitis: -See above.  3.Multiple  myeloma/Multilevel thoracolumbar compression fracture: -Continue current treatment per oncology. -will d/c all previous narcotics,  start PCA pump to  try to control pain. -will PT consult.  4. Anemia:   -HBg stable.  5. Hyponatremia: -I will increase IV fluids, check a basic metabolic panel in am. This is most likely secondarily to decrease PO intake.    LOS: 3 days   Marinda Elk M.D. Pager: [phone] [facility] 03/29/2011, 9:48 AM

## 2011-03-29 NOTE — Progress Notes (Signed)
POST OPERATIVE NOTE:  03/29/2011 Shane Woodard [id number]  VITALS: BP 128/80  Pulse 135  Temp(Src) 100.1 F (37.8 C) (Oral)  Resp 18  Ht 5\' 6"  (1.676 m)  Wt 167 lb (75.751 kg)  BMI 26.95 kg/m2  SpO2 91% BMET    Component Value Date/Time   NA 131* 03/29/2011 0354   K 3.7 03/29/2011 0354   CL 100 03/29/2011 0354   CO2 26 03/29/2011 0354   GLUCOSE 104* 03/29/2011 0354   BUN 22 03/29/2011 0354   CREATININE 0.98 03/29/2011 0354   CREATININE 1.03 03/13/2011 0812   CALCIUM 9.5 03/29/2011 0354   GFRNONAA >90 03/29/2011 0354   GFRAA >90 03/29/2011 0354   CBC    Component Value Date/Time   WBC 2.7* 03/29/2011 0746   WBC 4.0 03/08/2011 1105   RBC 3.25* 03/29/2011 0746   RBC 3.72* 03/08/2011 1105   HGB 8.5* 03/29/2011 0746   HGB 9.7* 03/08/2011 1105   HCT 26.8* 03/29/2011 0746   HCT 31.4* 03/08/2011 1105   PLT 116* 03/29/2011 0746   PLT 244 03/08/2011 1105   MCV 82.5 03/29/2011 0746   MCV 84.4 03/08/2011 1105   MCH 26.2 03/29/2011 0746   MCH 26.1* 03/08/2011 1105   MCHC 31.7 03/29/2011 0746   MCHC 30.9* 03/08/2011 1105   RDW 16.9* 03/29/2011 0746   RDW 17.3* 03/08/2011 1105   LYMPHSABS 0.3* 03/29/2011 0746   LYMPHSABS 1.7 03/08/2011 1105   MONOABS 0.0* 03/29/2011 0746   MONOABS 0.1 03/08/2011 1105   EOSABS 0.1 03/29/2011 0746   EOSABS 0.3 03/08/2011 1105   BASOSABS 0.0 03/29/2011 0746   BASOSABS 0.0 03/08/2011 1105    Lab 03/27/11 0315  APTT --  INR 1.06  PTT --    Shane Woodard is status post extraction of remaining teeth with alveoloplasty and pre-prosthetic surgery in the operating room.  SUBJECTIVE: Patient is lying in bed and is complaining primarily of back pain. Patient does indicate that he has some mouth discomfort at times.  EXAM: No sign of oral infection or ooze. Sutures are intact. Some dried heme is noted. Patient's mouth is dry, most likely from oxygen via nasal cannula . Some intraoral and extraoral swelling is noted.  ASSESSMENT: Post operative course is consistent with dental  procedures performed in the operating room.   PLAN: 1. Continue normal saline oral rinses every 2 hours while awake 2. I will follow patient for suture removal in approxiamtely 7-10 days as indicated.   Charlynne Pander, DDS

## 2011-03-29 NOTE — Evaluation (Signed)
Physical Therapy Evaluation Patient Details Name: Shane Woodard MRN: [id number] DOB: 1960-03-29 Today's Date: 03/29/2011 Pt limited by pain with all mobility today and was unable to progress to ambulation.  The pain subsided when he was returned to supine.  Pt hopeful to be able to progress to gait tomorrow.  Will use TLSO in upright to support back, manage pain, and hopefully allow mobility   Problem List:  Patient Active Problem List  Diagnoses  . Anemia  . Arthritis  . Multiple myeloma  . Hyponatremia    Past Medical History:  Past Medical History  Diagnosis Date  . Anemia   . Arthritis   . Multiple myeloma    Past Surgical History:  Past Surgical History  Procedure Date  . Left thumb surgery 11/26/2003  . Multiple extractions with alveoloplasty 03/28/2011    Procedure: MULTIPLE EXTRACION WITH ALVEOLOPLASTY;  Surgeon: Charlynne Pander, DDS;  Location: [facility];  Service: Oral Surgery;  Laterality: N/A;  Extraction of tooth #'s 2,3,4,5,6,7,8,9,10,11,12,13,14,15,17, 20,21,22,23,24,25,26,27,28, 31, and 32 with alveoloplasty and mandibular left torus reduction.    PT Assessment/Plan/Recommendation PT Assessment Clinical Impression Statement: pt with significant pain s/p kyphoplasty and full teeth extraction, now beginning to use PCA. He had difficult time today, but he feels like he will be able to walk tomorrow and we will use TLSO applied in sitting. standing and a support for back to assist in pain management. Hopefully, pt will progress and be able to D/C to home with HHPT and familty support or else he will need 24/7 care at SNF PT Recommendation/Assessment: Patient will need skilled PT in the acute care venue PT Problem List: Pain;Decreased mobility;Decreased cognition Barriers to Discharge: Decreased caregiver support PT Therapy Diagnosis : Acute pain;Difficulty walking PT Plan PT Frequency: Min 3X/week PT Treatment/Interventions: DME instruction;Gait training;Functional mobility  training;Therapeutic activities;Therapeutic exercise;Patient/family education PT Recommendation Recommendations for Other Services: OT consult Follow Up Recommendations: Home health PT;Skilled nursing facility;Other (comment) (depends on pt status at D/C) Equipment Recommended: Rolling walker with 5" wheels;3 in 1 bedside comode PT Goals  Acute Rehab PT Goals PT Goal Formulation: With patient Time For Goal Achievement: 2 weeks Pt will go Supine/Side to Sit: with modified independence PT Goal: Supine/Side to Sit - Progress: Goal set today Pt will go Sit to Supine/Side: with modified independence PT Goal: Sit to Supine/Side - Progress: Goal set today Pt will Ambulate: 51 - 150 feet;with modified independence;with least restrictive assistive device PT Goal: Ambulate - Progress: Goal set today  PT Evaluation Precautions/Restrictions  Precautions Precautions: Back (pt with kyphoplasty) Precaution Comments: recent onset of mulitple myeloma with compression fx Required Braces or Orthoses: Yes Spinal Brace: Thoracolumbosacral orthotic (ordered and received from ER 03/26/11) Restrictions Weight Bearing Restrictions: No Other Position/Activity Restrictions: multiple level kyphoplasty 03/27/11 Prior Functioning  Home Living Lives With: Alone Receives Help From: Family Home Layout: Two level;Able to live on main level with bedroom/bathroom (hospital bed on main floor) Home Access: Level entry Home Adaptive Equipment: None Prior Function Level of Independence: Independent with gait;Independent with transfers (independent unitl Dec. 2012) Able to Take Stairs?: No (pain) Comments: pt received brace prior to kyphoplasty, states he doesn't want to wear it now because it causes him pain Pt has teeth extracted 03/28/11 Cognition Cognition Arousal/Alertness: Awake/alert Overall Cognitive Status: Appears within functional limits for tasks assessed Orientation Level: Oriented X4 Cognition - Other  Comments: pts primary language is [place] but appears to understand and speak english well Sensation/Coordination Coordination Gross Motor Movements are Fluid and  Coordinated: Yes (pt appears to have good muscle bulk in L/Es) Extremity Assessment RLE Assessment RLE Assessment: Within Functional Limits LLE Assessment LLE Assessment: Within Functional Limits Mobility (including Balance) Bed Mobility Supine to Sit: 3: Mod assist (pt educated in log rolling ) Supine to Sit Details (indicate cue type and reason): pt with some pain with rolling, but INCREASED pain with pushing up from sidelying into sit Sit to Supine: 3: Mod assist;Other (comment) (pt limited by severe pain) Sit to Supine - Details (indicate cue type and reason): assisted pt with legs to assure log rolling Transfers Transfers: Yes Sit to Stand: 1: +2 Total assist;With upper extremity assist;From bed;From elevated surface Sit to Stand Details (indicate cue type and reason): pt needed assist to shift forward and put weight on legs to stand Stand to Sit: 1: +2 Total assist Stand to Sit Details: pt needed assist to control descent  Ambulation/Gait Ambulation/Gait: Yes Ambulation/Gait Assistance: 3: Mod assist Ambulation/Gait Assistance Details (indicate cue type and reason): pt pushed up on RW and was able to take 3 small steps to get to  top of bed. Ambulation Distance (Feet): 2 Feet Assistive device: Rolling walker Gait Pattern: Step-to pattern Gait velocity: slow Wheelchair Mobility Wheelchair Mobility: No  Posture/Postural Control Posture/Postural Control: Postural limitations Postural Limitations: pt very limited by pain in back Balance Balance Assessed: No Exercise    End of Session PT - End of Session Equipment Utilized During Treatment:  (RW) Activity Tolerance: Patient limited by pain Patient left: in bed Nurse Communication: Mobility status for transfers;Mobility status for ambulation General Behavior  During Session: [facility] for tasks performed Cognition: [facility] for tasks performed  Donnetta Hail 03/29/2011, 3:44 PM

## 2011-03-29 NOTE — Progress Notes (Signed)
Pt has O2 sats of lower 80's and HR in 130's.. No fever (temp 97.1). Started O2 at 15L nonrebreather for 5 minutes and then decreased to 5L. Pt now sating at 97%, HR in 120's. Will continue to monitor pt.

## 2011-03-29 NOTE — Progress Notes (Signed)
1 Day Post-Op  Subjective: Patient still having mid to low back pain, minimal relief so far from [facility] 1/29; also has diffuse skin rash ? Drug reaction; no new neurological c/o  Objective: Vital signs in last 24 hours: Temp:  [97.1 F (36.2 C)-100.1 F (37.8 C)] 100.1 F (37.8 C) (01/31 0900) Pulse Rate:  [86-142] 142  (01/31 0900) Resp:  [16-18] 18  (01/31 0900) BP: (126-142)/(63-94) 126/72 mmHg (01/31 0900) SpO2:  [81 %-98 %] 91 % (01/31 0900) Last BM Date: 03/19/11  Intake/Output from previous day: 01/30 0701 - 01/31 0700 In: 2793.8 [I.V.:2793.8] Out: 2090 [Urine:2065; Blood:25] Intake/Output this shift: Total I/O In: -  Out: 575 [Urine:575]  Puncture sites T12, L1/2/3 clean and dry, mild to mod tender to palpation, no hematomas; pt moving all fours ok  Lab Results:   [facility] 03/29/11 0746 03/27/11 0315  WBC 2.7* 3.8*  HGB 8.5* 9.1*  HCT 26.8* 28.1*  PLT 116* 210   BMET  Basename 03/29/11 0354 03/27/11 0315  NA 131* 128*  K 3.7 4.4  CL 100 96  CO2 26 27  GLUCOSE 104* 120*  BUN 22 15  CREATININE 0.98 0.99  CALCIUM 9.5 10.3   PT/INR  Basename 03/27/11 0315  LABPROT 14.0  INR 1.06   ABG No results found for this basename: PHART:2,PCO2:2,PO2:2,HCO3:2 in the last 72 hours  Studies/Results: Ir Kyphoplasty Or Sacroplasty  03/28/2011  *RADIOLOGY REPORT*  Clinical Data:  History of multiple myeloma.  Severe thoracolumbar pain secondary to new compression fractures at T12, L1, L2 and L3.  LUMBAR KYPHOPLASTY AT L1, L2 AND L3, AND KYPHOPLASTY AT T12  Comparison:  MRI scan of the thoracolumbar spine of 03/26/2011.  Following a full explanation of the procedure along with the potential associated complications, an informed witnessed consent was obtained.  The patient was placed prone on the fluoroscopic table.  The skin overlying the thoracolumbar region was then prepped and draped in the usual sterile fashion.  The left pedicle at T12, the right pedicle at L1, the right  pedicle at L2 and the left pedicle at L3 were then infiltrated with 0.25% bupivacaine.  Using biplane intermittent fluoroscopy, 11-gauge Jamshidi needles were then advanced into the posterior one-thirds of T12, L1, L2 and L3.  Using biplane intermittent fluoroscopy, these were then exchanged out for the Kyphon advanced osteo introducer system comprised of a working cannula and a Kyphon osteo drill over a Kyphon osteo bone pin at T12, L1, L2 and L3.  The bone pins were then removed at all the levels.  Using biplane intermittent fluoroscopy, in a  medial trajectory, the combinations were then advanced until the tips of the Kyphon working cannulae were in the anterior third at all levels.  Crossing of the midline by the Kyphon osteo drills was seen at all the levels.  The Kyphon osteo drills were then removed and core samples from all the levels were sent in separate vials for pathologic analysis.  Through the working cannulae, a Kyphon bone biopsy device was then advanced through each of the working cannulae.  Samples from these were also sent for pathologic analysis.  At this time, Kyphon inflatable bone tamps 20 x 3 were then advanced through each of the needles until the tips of the distal markers were within 5 mm of the anterior aspect of the anterior borders at T12, L1, L2 and L3.  At each of the levels, the Kyphon inflatable tamps were then expanded with contrast via microtubing connected to the  Kyphon inflation syringe devices.  Inflations were continued until there was apposition with the superior and the inferior endplate at L1, the inferior endplate at T12, and the inferior endplate at L2 and L3.  Methylmethacrylate mixture was then reconstituted with Tobramycin in the Kyphon bone mixing system.  This was then loaded onto Kyphon bone fillers.  The Kyphon inflatable bone tamps were then deflated and removed.  At L1, 1.5 bone filler equivalents of methylmethacrylate mixture was injected; at  T12, 3 bone filler  equivalents of methylmethacrylate mixture was injected, at L3 and at L2, 3 bone filler equivalents of methylmethacrylate mixture was injected.  At each of the levels, excellent filling was obtained in the AP and lateral projections.  There was no extrusion noted into the adjacent disc spaces or posteriorly into the spinal canal.  No paraspinous venous contamination was seen at any of the levels.  The bone fillers and the working cannulae were then removed at each of the levels.  Hemostasis was achieved at the skin entry sites.  The patient tolerated the procedure well.  There were no acute complications.  Medications utilized:  Versed 5 mg IV.  Fentanyl 150 mcg IV. Dilaudid 2 mg IV.  IMPRESSION: 1.  Status post fluoroscopic-guided needle placement for deep core bone biopsy at T12, L-1, L-2 and L3. 2. Status post vertebral body augmentation for painful pathologic compression fractures at T12, L1, L2 and L3 using the balloon kyphoplasty technique.  Original Report Authenticated By: Oneal Grout, M.D.   Ir Kyphoplasty Or Sacroplasty  03/28/2011  *RADIOLOGY REPORT*  Clinical Data:  History of multiple myeloma.  Severe thoracolumbar pain secondary to new compression fractures at T12, L1, L2 and L3.  LUMBAR KYPHOPLASTY AT L1, L2 AND L3, AND KYPHOPLASTY AT T12  Comparison:  MRI scan of the thoracolumbar spine of 03/26/2011.  Following a full explanation of the procedure along with the potential associated complications, an informed witnessed consent was obtained.  The patient was placed prone on the fluoroscopic table.  The skin overlying the thoracolumbar region was then prepped and draped in the usual sterile fashion.  The left pedicle at T12, the right pedicle at L1, the right pedicle at L2 and the left pedicle at L3 were then infiltrated with 0.25% bupivacaine.  Using biplane intermittent fluoroscopy, 11-gauge Jamshidi needles were then advanced into the posterior one-thirds of T12, L1, L2 and L3.  Using  biplane intermittent fluoroscopy, these were then exchanged out for the Kyphon advanced osteo introducer system comprised of a working cannula and a Kyphon osteo drill over a Kyphon osteo bone pin at T12, L1, L2 and L3.  The bone pins were then removed at all the levels.  Using biplane intermittent fluoroscopy, in a  medial trajectory, the combinations were then advanced until the tips of the Kyphon working cannulae were in the anterior third at all levels.  Crossing of the midline by the Kyphon osteo drills was seen at all the levels.  The Kyphon osteo drills were then removed and core samples from all the levels were sent in separate vials for pathologic analysis.  Through the working cannulae, a Kyphon bone biopsy device was then advanced through each of the working cannulae.  Samples from these were also sent for pathologic analysis.  At this time, Kyphon inflatable bone tamps 20 x 3 were then advanced through each of the needles until the tips of the distal markers were within 5 mm of the anterior aspect of the anterior borders at  T12, L1, L2 and L3.  At each of the levels, the Kyphon inflatable tamps were then expanded with contrast via microtubing connected to the Kyphon inflation syringe devices.  Inflations were continued until there was apposition with the superior and the inferior endplate at L1, the inferior endplate at T12, and the inferior endplate at L2 and L3.  Methylmethacrylate mixture was then reconstituted with Tobramycin in the Kyphon bone mixing system.  This was then loaded onto Kyphon bone fillers.  The Kyphon inflatable bone tamps were then deflated and removed.  At L1, 1.5 bone filler equivalents of methylmethacrylate mixture was injected; at  T12, 3 bone filler equivalents of methylmethacrylate mixture was injected, at L3 and at L2, 3 bone filler equivalents of methylmethacrylate mixture was injected.  At each of the levels, excellent filling was obtained in the AP and lateral projections.   There was no extrusion noted into the adjacent disc spaces or posteriorly into the spinal canal.  No paraspinous venous contamination was seen at any of the levels.  The bone fillers and the working cannulae were then removed at each of the levels.  Hemostasis was achieved at the skin entry sites.  The patient tolerated the procedure well.  There were no acute complications.  Medications utilized:  Versed 5 mg IV.  Fentanyl 150 mcg IV. Dilaudid 2 mg IV.  IMPRESSION: 1.  Status post fluoroscopic-guided needle placement for deep core bone biopsy at T12, L-1, L-2 and L3. 2. Status post vertebral body augmentation for painful pathologic compression fractures at T12, L1, L2 and L3 using the balloon kyphoplasty technique.  Original Report Authenticated By: Oneal Grout, M.D.   Ir Kyphoplasty Or Sacroplasty  03/28/2011  *RADIOLOGY REPORT*  Clinical Data:  History of multiple myeloma.  Severe thoracolumbar pain secondary to new compression fractures at T12, L1, L2 and L3.  LUMBAR KYPHOPLASTY AT L1, L2 AND L3, AND KYPHOPLASTY AT T12  Comparison:  MRI scan of the thoracolumbar spine of 03/26/2011.  Following a full explanation of the procedure along with the potential associated complications, an informed witnessed consent was obtained.  The patient was placed prone on the fluoroscopic table.  The skin overlying the thoracolumbar region was then prepped and draped in the usual sterile fashion.  The left pedicle at T12, the right pedicle at L1, the right pedicle at L2 and the left pedicle at L3 were then infiltrated with 0.25% bupivacaine.  Using biplane intermittent fluoroscopy, 11-gauge Jamshidi needles were then advanced into the posterior one-thirds of T12, L1, L2 and L3.  Using biplane intermittent fluoroscopy, these were then exchanged out for the Kyphon advanced osteo introducer system comprised of a working cannula and a Kyphon osteo drill over a Kyphon osteo bone pin at T12, L1, L2 and L3.  The bone pins  were then removed at all the levels.  Using biplane intermittent fluoroscopy, in a  medial trajectory, the combinations were then advanced until the tips of the Kyphon working cannulae were in the anterior third at all levels.  Crossing of the midline by the Kyphon osteo drills was seen at all the levels.  The Kyphon osteo drills were then removed and core samples from all the levels were sent in separate vials for pathologic analysis.  Through the working cannulae, a Kyphon bone biopsy device was then advanced through each of the working cannulae.  Samples from these were also sent for pathologic analysis.  At this time, Kyphon inflatable bone tamps 20 x 3 were then advanced  through each of the needles until the tips of the distal markers were within 5 mm of the anterior aspect of the anterior borders at T12, L1, L2 and L3.  At each of the levels, the Kyphon inflatable tamps were then expanded with contrast via microtubing connected to the Kyphon inflation syringe devices.  Inflations were continued until there was apposition with the superior and the inferior endplate at L1, the inferior endplate at T12, and the inferior endplate at L2 and L3.  Methylmethacrylate mixture was then reconstituted with Tobramycin in the Kyphon bone mixing system.  This was then loaded onto Kyphon bone fillers.  The Kyphon inflatable bone tamps were then deflated and removed.  At L1, 1.5 bone filler equivalents of methylmethacrylate mixture was injected; at  T12, 3 bone filler equivalents of methylmethacrylate mixture was injected, at L3 and at L2, 3 bone filler equivalents of methylmethacrylate mixture was injected.  At each of the levels, excellent filling was obtained in the AP and lateral projections.  There was no extrusion noted into the adjacent disc spaces or posteriorly into the spinal canal.  No paraspinous venous contamination was seen at any of the levels.  The bone fillers and the working cannulae were then removed at each  of the levels.  Hemostasis was achieved at the skin entry sites.  The patient tolerated the procedure well.  There were no acute complications.  Medications utilized:  Versed 5 mg IV.  Fentanyl 150 mcg IV. Dilaudid 2 mg IV.  IMPRESSION: 1.  Status post fluoroscopic-guided needle placement for deep core bone biopsy at T12, L-1, L-2 and L3. 2. Status post vertebral body augmentation for painful pathologic compression fractures at T12, L1, L2 and L3 using the balloon kyphoplasty technique.  Original Report Authenticated By: Oneal Grout, M.D.   Ir Kyphoplasty Or Sacroplasty  03/28/2011  *RADIOLOGY REPORT*  Clinical Data:  History of multiple myeloma.  Severe thoracolumbar pain secondary to new compression fractures at T12, L1, L2 and L3.  LUMBAR KYPHOPLASTY AT L1, L2 AND L3, AND KYPHOPLASTY AT T12  Comparison:  MRI scan of the thoracolumbar spine of 03/26/2011.  Following a full explanation of the procedure along with the potential associated complications, an informed witnessed consent was obtained.  The patient was placed prone on the fluoroscopic table.  The skin overlying the thoracolumbar region was then prepped and draped in the usual sterile fashion.  The left pedicle at T12, the right pedicle at L1, the right pedicle at L2 and the left pedicle at L3 were then infiltrated with 0.25% bupivacaine.  Using biplane intermittent fluoroscopy, 11-gauge Jamshidi needles were then advanced into the posterior one-thirds of T12, L1, L2 and L3.  Using biplane intermittent fluoroscopy, these were then exchanged out for the Kyphon advanced osteo introducer system comprised of a working cannula and a Kyphon osteo drill over a Kyphon osteo bone pin at T12, L1, L2 and L3.  The bone pins were then removed at all the levels.  Using biplane intermittent fluoroscopy, in a  medial trajectory, the combinations were then advanced until the tips of the Kyphon working cannulae were in the anterior third at all levels.  Crossing  of the midline by the Kyphon osteo drills was seen at all the levels.  The Kyphon osteo drills were then removed and core samples from all the levels were sent in separate vials for pathologic analysis.  Through the working cannulae, a Kyphon bone biopsy device was then advanced through each of the working  cannulae.  Samples from these were also sent for pathologic analysis.  At this time, Kyphon inflatable bone tamps 20 x 3 were then advanced through each of the needles until the tips of the distal markers were within 5 mm of the anterior aspect of the anterior borders at T12, L1, L2 and L3.  At each of the levels, the Kyphon inflatable tamps were then expanded with contrast via microtubing connected to the Kyphon inflation syringe devices.  Inflations were continued until there was apposition with the superior and the inferior endplate at L1, the inferior endplate at T12, and the inferior endplate at L2 and L3.  Methylmethacrylate mixture was then reconstituted with Tobramycin in the Kyphon bone mixing system.  This was then loaded onto Kyphon bone fillers.  The Kyphon inflatable bone tamps were then deflated and removed.  At L1, 1.5 bone filler equivalents of methylmethacrylate mixture was injected; at  T12, 3 bone filler equivalents of methylmethacrylate mixture was injected, at L3 and at L2, 3 bone filler equivalents of methylmethacrylate mixture was injected.  At each of the levels, excellent filling was obtained in the AP and lateral projections.  There was no extrusion noted into the adjacent disc spaces or posteriorly into the spinal canal.  No paraspinous venous contamination was seen at any of the levels.  The bone fillers and the working cannulae were then removed at each of the levels.  Hemostasis was achieved at the skin entry sites.  The patient tolerated the procedure well.  There were no acute complications.  Medications utilized:  Versed 5 mg IV.  Fentanyl 150 mcg IV. Dilaudid 2 mg IV.  IMPRESSION:  1.  Status post fluoroscopic-guided needle placement for deep core bone biopsy at T12, L-1, L-2 and L3. 2. Status post vertebral body augmentation for painful pathologic compression fractures at T12, L1, L2 and L3 using the balloon kyphoplasty technique.  Original Report Authenticated By: Oneal Grout, M.D.    Anti-infectives: Anti-infectives     Start     Dose/Rate Route Frequency Ordered Stop   03/28/11 1000   sulfamethoxazole-trimethoprim (BACTRIM DS,SEPTRA DS) 800-160 MG per tablet 1 tablet        1 tablet Oral Every M-W-F 03/26/11 1320     03/27/11 1600   ceFAZolin (ANCEF) IVPB 1 g/50 mL premix        1 g 100 mL/hr over 30 Minutes Intravenous 60 min pre-op 03/27/11 1553 03/28/11 0740   03/27/11 0000   ceFAZolin (ANCEF) IVPB 1 g/50 mL premix        1 g 100 mL/hr over 30 Minutes Intravenous  Once 03/26/11 2338 03/27/11 1235   03/26/11 1500   acyclovir (ZOVIRAX) tablet 400 mg        400 mg Oral Daily 03/26/11 1320            Assessment/Plan: s/p T12/ L1/L2/L3 KP with biopsies 1/29; check path; PT; pain meds per oncology    LOS: 3 days    Kathreen Dileo,D [facility] 03/29/2011

## 2011-03-30 ENCOUNTER — Other Ambulatory Visit: Payer: Self-pay

## 2011-03-30 ENCOUNTER — Inpatient Hospital Stay (HOSPITAL_COMMUNITY): Payer: Medicaid Other

## 2011-03-30 DIAGNOSIS — A419 Sepsis, unspecified organism: Secondary | ICD-10-CM

## 2011-03-30 DIAGNOSIS — D649 Anemia, unspecified: Secondary | ICD-10-CM

## 2011-03-30 LAB — LACTIC ACID, PLASMA
Lactic Acid, Venous: 1.7 mmol/L (ref 0.5–2.2)
Lactic Acid, Venous: 1.3 mmol/L (ref 0.5–2.2)

## 2011-03-30 LAB — BLOOD GAS, ARTERIAL
Acid-base deficit: 4.2 mmol/L — ABNORMAL HIGH (ref 0.0–2.0)
Bicarbonate: 19.6 mEq/L — ABNORMAL LOW (ref 20.0–24.0)
O2 Saturation: 90.4 %
pO2, Arterial: 56.3 mmHg — ABNORMAL LOW (ref 80.0–100.0)

## 2011-03-30 LAB — CBC
MCH: 26.1 pg (ref 26.0–34.0)
Platelets: 86 10*3/uL — ABNORMAL LOW (ref 150–400)
Platelets: 54 10*3/uL — ABNORMAL LOW (ref 150–400)
RBC: 2.8 MIL/uL — ABNORMAL LOW (ref 4.22–5.81)
RBC: 2.57 MIL/uL — ABNORMAL LOW (ref 4.22–5.81)
RDW: 17.2 % — ABNORMAL HIGH (ref 11.5–15.5)
WBC: 3.1 10*3/uL — ABNORMAL LOW (ref 4.0–10.5)

## 2011-03-30 LAB — DIFFERENTIAL
Basophils Absolute: 0 10*3/uL (ref 0.0–0.1)
Eosinophils Relative: 1 % (ref 0–5)
Lymphocytes Relative: 13 % (ref 12–46)
Lymphs Abs: 0.4 10*3/uL — ABNORMAL LOW (ref 0.7–4.0)
Monocytes Relative: 3 % (ref 3–12)
Neutrophils Relative %: 83 % — ABNORMAL HIGH (ref 43–77)
WBC Morphology: INCREASED

## 2011-03-30 LAB — BASIC METABOLIC PANEL
BUN: 20 mg/dL (ref 6–23)
CO2: 22 mEq/L (ref 19–32)
Chloride: 105 mEq/L (ref 96–112)
Glucose, Bld: 108 mg/dL — ABNORMAL HIGH (ref 70–99)
Potassium: 4.3 mEq/L (ref 3.5–5.1)

## 2011-03-30 LAB — VITAMIN B12: Vitamin B-12: 343 pg/mL (ref 211–911)

## 2011-03-30 LAB — PROCALCITONIN: Procalcitonin: 39.12 ng/mL

## 2011-03-30 LAB — PREPARE RBC (CROSSMATCH)

## 2011-03-30 LAB — IRON AND TIBC: Iron: 12 ug/dL — ABNORMAL LOW (ref 42–135)

## 2011-03-30 LAB — ABO/RH: ABO/RH(D): B POS

## 2011-03-30 LAB — MRSA PCR SCREENING: MRSA by PCR: NEGATIVE

## 2011-03-30 LAB — FERRITIN: Ferritin: 1990 ng/mL — ABNORMAL HIGH (ref 22–322)

## 2011-03-30 LAB — RETICULOCYTES
Retic Count, Absolute: 13.2 10*3/uL — ABNORMAL LOW (ref 19.0–186.0)
Retic Ct Pct: 0.5 % (ref 0.4–3.1)

## 2011-03-30 MED ORDER — FENTANYL CITRATE 0.05 MG/ML IJ SOLN
50.0000 ug | INTRAMUSCULAR | Status: DC | PRN
  Administered 2011-03-30 – 2011-04-01 (×10): 50 ug via INTRAVENOUS
  Filled 2011-03-30 (×10): qty 2

## 2011-03-30 MED ORDER — ACETAMINOPHEN 650 MG RE SUPP
RECTAL | Status: AC
  Administered 2011-03-30: 650 mg
  Filled 2011-03-30: qty 1

## 2011-03-30 MED ORDER — SODIUM CHLORIDE 0.9 % IV BOLUS (SEPSIS): 500.0000 mL | Freq: Once | INTRAVENOUS | Status: DC

## 2011-03-30 MED ORDER — BIOTENE DRY MOUTH MT LIQD
15.0000 mL | Freq: Two times a day (BID) | OROMUCOSAL | Status: DC
  Administered 2011-03-30 – 2011-04-04 (×11): 15 mL via OROMUCOSAL

## 2011-03-30 MED ORDER — SODIUM CHLORIDE 0.9 % IV BOLUS (SEPSIS)
1000.0000 mL | Freq: Once | INTRAVENOUS | Status: AC
  Administered 2011-03-30: 1000 mL via INTRAVENOUS

## 2011-03-30 MED ORDER — SODIUM CHLORIDE 0.9 % IV BOLUS (SEPSIS)
500.0000 mL | Freq: Once | INTRAVENOUS | Status: AC
  Administered 2011-03-30: 500 mL via INTRAVENOUS

## 2011-03-30 MED ORDER — HYDROCORTISONE SOD SUCCINATE 100 MG IJ SOLR
50.0000 mg | Freq: Four times a day (QID) | INTRAMUSCULAR | Status: DC
  Administered 2011-03-30 – 2011-04-02 (×14): 50 mg via INTRAVENOUS
  Filled 2011-03-30 (×7): qty 1
  Filled 2011-03-30: qty 2
  Filled 2011-03-30: qty 1
  Filled 2011-03-30: qty 2
  Filled 2011-03-30 (×2): qty 1
  Filled 2011-03-30: qty 2
  Filled 2011-03-30 (×2): qty 1
  Filled 2011-03-30: qty 2
  Filled 2011-03-30: qty 1

## 2011-03-30 MED ORDER — SODIUM CHLORIDE 0.9 % IV SOLN
500.0000 mg | Freq: Four times a day (QID) | INTRAVENOUS | Status: DC
  Administered 2011-03-30 – 2011-04-02 (×13): 500 mg via INTRAVENOUS
  Filled 2011-03-30 (×19): qty 500

## 2011-03-30 MED ORDER — VANCOMYCIN HCL 1000 MG IV SOLR
750.0000 mg | Freq: Three times a day (TID) | INTRAVENOUS | Status: DC
  Administered 2011-03-30 – 2011-03-31 (×5): 750 mg via INTRAVENOUS
  Filled 2011-03-30 (×10): qty 750

## 2011-03-30 MED ORDER — ACETAMINOPHEN 325 MG PO TABS: 650.0000 mg | ORAL_TABLET | ORAL | Status: DC | PRN

## 2011-03-30 MED ORDER — SODIUM CHLORIDE 0.9 % IV SOLN
500.0000 mg | Freq: Once | INTRAVENOUS | Status: AC
  Administered 2011-03-30: 500 mg via INTRAVENOUS
  Filled 2011-03-30: qty 500

## 2011-03-30 MED ORDER — FENTANYL CITRATE 0.05 MG/ML IJ SOLN
25.0000 ug | INTRAMUSCULAR | Status: DC | PRN
  Administered 2011-03-30 (×3): 25 ug via INTRAVENOUS
  Filled 2011-03-30 (×5): qty 2

## 2011-03-30 MED ORDER — PANTOPRAZOLE SODIUM 40 MG IV SOLR
40.0000 mg | INTRAVENOUS | Status: DC
  Administered 2011-03-30: 40 mg via INTRAVENOUS
  Filled 2011-03-30 (×2): qty 40

## 2011-03-30 NOTE — Consult Note (Signed)
Name: Shane Woodard MRN: [id number] DOB: 03/21/1960    LOS: 4  [facility]  History of Present Illness: This is a 51 year old male who presented to the ED on 1/28 with severe lower back pain. He was found to have multiple pathological fractures due to his multiple myeloma. He was admitted to the floor for kyphoplasty (1/29). He also was noted to have very poor dentition with loose teeth. On 1/30 he was taken to the OR for extraction of all teeth. Post op (1/31) patient became febrile to 105 and hypotensive. Patient was brought to the ICU and PCCM was consulted to evaluate.    Lines / Drains:   Cultures: Urine 2/1 >>> BC X2 2/1 >>>  Antibiotics: Acyclovir 1/28 (Home med) >>> Imipenem 2/1 >>> Bactrim/Septra 1/30 (PCP prophylaxis) >>> Vanco 2/1 >>>  Tests / Events: Kyphoplasty 1/29 Dental extraction 1/30 Rapid response d/t hypotension/fever 2/1  Past Medical History  Diagnosis Date  . Anemia   . Arthritis   . Multiple myeloma    Past Surgical History  Procedure Date  . Left thumb surgery 11/26/2003  . Multiple extractions with alveoloplasty 03/28/2011    Procedure: MULTIPLE EXTRACION WITH ALVEOLOPLASTY;  Surgeon: Charlynne Pander, DDS;  Location: [facility];  Service: Oral Surgery;  Laterality: N/A;  Extraction of tooth #'s 2,3,4,5,6,7,8,9,10,11,12,13,14,15,17, 20,21,22,23,24,25,26,27,28, 31, and 32 with alveoloplasty and mandibular left torus reduction.   Prior to Admission medications   Medication Sig Start Date End Date Taking? Authorizing Provider  acyclovir (ZOVIRAX) 400 MG tablet Take 1 tablet (400 mg total) by mouth daily. 03/13/11 03/12/12 Yes Jethro Bolus, MD  bortezomib IV ([name]) 3.5 MG injection Inject 1.3 mg/m2 into the vein as directed. 2 weeks on & 2 weeks off (patient [facility] at [facility])   Yes Historical Provider, MD  dexamethasone (DECADRON) 4 MG tablet Take 40 mg by mouth every 7 (seven) days. Mondays. 03/13/11  Yes Jethro Bolus, MD  enoxaparin (LOVENOX) 40  MG/0.4ML SOLN Inject 0.4 mLs (40 mg total) into the skin daily. 03/13/11  Yes Jethro Bolus, MD  lenalidomide (REVLIMID) 25 MG capsule Take 25 mg by mouth as directed. 21 days on and 7 days off (patient began on 03/21/2011) 03/21/11  Yes Jethro Bolus, MD  morphine (MS CONTIN) 15 MG 12 hr tablet Take 1 tablet (15 mg total) by mouth 2 (two) times daily. 03/22/11 04/21/11 Yes Jethro Bolus, MD  ondansetron (ZOFRAN) 8 MG tablet Take 1 tablet two times a day starting the day after chemo for 2 days. Then take 1 tablet two times a day as needed for nausea or vomiting. 03/13/11 03/12/12 Yes Jethro Bolus, MD  oxyCODONE-acetaminophen (PERCOCET) 5-325 MG per tablet Take 1 tablet by mouth every 4 (four) hours as needed. For breakthrough pain. 03/22/11 04/01/11 Yes Jethro Bolus, MD  prochlorperazine (COMPAZINE) 10 MG tablet Take 1 tablet (10 mg total) by mouth every 6 (six) hours as needed (Nausea or vomiting). 03/13/11 03/12/12 Yes Jethro Bolus, MD  sulfamethoxazole-trimethoprim (BACTRIM DS,SEPTRA DS) 800-160 MG per tablet Take 1 tablet by mouth every Monday, Wednesday, and Friday. 03/13/11 03/12/12 Yes Jethro Bolus, MD   Allergies No Known Allergies  Family History History reviewed. No pertinent family history.  Social History  reports that he has been smoking Cigarettes.  He has a 15 pack-year smoking history. He has never used smokeless tobacco. He reports that he does not drink alcohol or use illicit drugs.   Vital Signs: Temp:  [97.2 F (36.2 C)-105.2 F (40.7 C)] 99.6  F (37.6 C) (02/01 0800) Pulse Rate:  [110-147] 110  (02/01 0800) Resp:  [17-28] 17  (02/01 0820) BP: (73-128)/(39-80) 88/57 mmHg (02/01 0800) SpO2:  [89 %-100 %] 94 % (02/01 0820) FiO2 (%):  [50 %-100 %] 50 % (02/01 0700) Weight:  [71.2 kg (156 lb 15.5 oz)] 71.2 kg (156 lb 15.5 oz) (02/01 0100)      . sodium chloride 125 mL/hr at 03/30/11 0214    Intake/Output Summary (Last 24 hours) at 03/30/11 1003 Last data filed at 03/30/11 0820  Gross per 24 hour  Intake 4246.1 ml   Output   1050 ml  Net 3196.1 ml    Physical Examination: General: no acute distress Neuro: Lethargic, arouses to voice, follows commands  HEENT: think drainage-tan/brown  Neck: supple, no JVD   Cardiovascular: tachy, no murmur Lungs: right sided rhonchi  Abdomen:  Soft, nontender Musculoskeletal: no edema    Labs and Imaging:   Lab 03/29/11 0354 03/27/11 0315 03/26/11 0420  NA 131* 128* 133*  K 3.7 4.4 3.8  CL 100 96 99  CO2 26 27 27   BUN 22 15 15   CREATININE 0.98 0.99 1.15  GLUCOSE 104* 120* 105*    Lab 03/30/11 0130 03/29/11 0746 03/27/11 0315  HGB 7.3* 8.5* 9.1*  HCT 23.1* 26.8* 28.1*  WBC 3.1* 2.7* 3.8*  PLT 86* 116* 210   CXR: Right side infiltrate   Assessment and Plan: SIRS/Septic Shock- patient with hypotension, fever. Source most likely aspiration s/p dental extraction Hypotension exacerbated by narcotics   Lab 03/30/11 0130 03/29/11 0746 03/27/11 0315 03/26/11 0420  PROCALCITON 39.12 -- -- --  WBC 3.1* 2.7* 3.8* 2.8*  LATICACIDVEN 1.7 -- -- --  Plan: -NS bolus, if remains hypotensive will place CVL  -Hold dilaudid PCA for now -send Lactic acid, procal, ABG now -Continue empiric Vanco, Imipenem   -continue Solu-cortef- patient takes Decadron at home  Anemia- secondary to MM  Lab 03/30/11 0130 03/29/11 0746 03/27/11 0315  HGB 7.3* 8.5* 9.1*  Plan: - will re-check CBC this afternoon -follow CBC  Thrombocytopenia  Lab 03/30/11 0130 03/29/11 0746 03/27/11 0315  PLT 86* 116* 210  Plan: -monitor CBC  Hyponatremia  Lab 03/29/11 0354 03/27/11 0315 03/26/11 0420  NA 131* 128* 133*  Plan: -monitor BMP -Continue NS  Multiple myeloma- Therapy started 1/21 Plan: -Per Dr. Gaylyn Rong  S/p dental extraction (1/30) Plan: -follow dental recs -good oral hygiene   S/p kyphoplasty Plan: -follow IR recs   Best practices / Disposition: -->ICU status under PCCM -->full code -->Lovenox for DVT Px -->Protonix for GI Px -->diet clears   The  patient is critically ill with multiple organ systems failure and requires high complexity decision making for assessment and support, frequent evaluation and titration of therapies, application of advanced monitoring technologies and extensive interpretation of multiple databases. Critical Care Time devoted to patient care services described in this note is 60  minutes.  BABCOCK,PETE 03/30/2011, 10:02 AM  Levy Pupa, MD, PhD 03/30/2011, 3:35 PM [facility] [phone] or if no answer [phone]

## 2011-03-30 NOTE — Progress Notes (Signed)
CRITICAL VALUE ALERT  Critical value received: Hg 6.2   Date of notification:  03/30/11  Time of notification:  14:00  Critical value read back: yes  Nurse who received alert:  Vernell Leep  MD notified (1st page):  Anders Simmonds NP  Time of first page:  14:11  MD notified (2nd page):  Time of second page:  Responding MD:  Anders Simmonds, NP  Time MD responded:  14:12

## 2011-03-30 NOTE — Progress Notes (Signed)
Pt has a BP of 80/50 with manual cuff, HR of 140, SpO2 of 92 with Venturi mask and an oral temp of 97.8.  Notified NP of VS.  NP came down to check on pt.  Received new orders.  Will continue to monitor pt. Daphene Calamity [facility] 03/29/2011 23:30

## 2011-03-30 NOTE — Progress Notes (Signed)
Name: Shane Woodard MRN: [id number] DOB: [birth year]  ELECTRONIC ICU PHYSICIAN NOTE  Problem:  Pain refractory to Fentanyl 25   Intervention:  Increase fentanyl to 50 mcg q 1h prn  Sandrea Hughs 03/30/2011, 9:08 PM

## 2011-03-30 NOTE — Progress Notes (Signed)
Pt temp was 105.2 rectally with 140 HR, 28 R, 80/50 BP and 92% on Venturi mask.  Notified NP on call of temperature and called Rapid Response.  Gave acetaminophen for fever.  On call NP came to see pt again.  Pt moved to ICU. Shane Woodard [place] 03/30/2011 12:20AM

## 2011-03-30 NOTE — Progress Notes (Signed)
ANTIBIOTIC CONSULT NOTE - INITIAL  Pharmacy Consult for Primaxin/Vancomycin Indication: Fever/Hx multiple myeloma/s/p multiple teeth removal   No Known Allergies  Patient Measurements: Height: 5\' 6"  (167.6 cm) Weight: 167 lb (75.751 kg) IBW/kg (Calculated) : 63.8    Vital Signs: Temp: 105.2 F (40.7 C) (02/01 0017) Temp src: Oral (01/31 2330) BP: 80/50 mmHg (01/31 2330) Pulse Rate: 140  (01/31 2330) Intake/Output from previous day: 01/31 0701 - 02/01 0700 In: 815 [I.V.:815] Out: 1125 [Urine:1125] Intake/Output from this shift: Total I/O In: -  Out: 275 [Urine:275]  Labs:  [facility] 03/29/11 0746 03/29/11 0354 03/27/11 0315  WBC 2.7* -- 3.8*  HGB 8.5* -- 9.1*  PLT 116* -- 210  LABCREA -- -- --  CREATININE -- 0.98 0.99   Estimated Creatinine Clearance: 79.6 ml/min (by C-G formula based on Cr of 0.98). No results found for this basename: VANCOTROUGH:2,VANCOPEAK:2,VANCORANDOM:2,GENTTROUGH:2,GENTPEAK:2,GENTRANDOM:2,TOBRATROUGH:2,TOBRAPEAK:2,TOBRARND:2,AMIKACINPEAK:2,AMIKACINTROU:2,AMIKACIN:2, in the last 72 hours   Microbiology: No results found for this or any previous visit (from the past 720 hour(s)).  Medical History: Past Medical History  Diagnosis Date  . Anemia   . Arthritis   . Multiple myeloma     Medications:  Scheduled:    . acetaminophen      . acyclovir  400 mg Oral Daily  . bortezomib SQ  1.3 mg/m2 (Treatment Plan Actual) Subcutaneous Once  . cyclobenzaprine  5 mg Oral TID  . dexamethasone  40 mg Oral Q7 days  . enoxaparin  40 mg Subcutaneous Daily  . HYDROmorphone PCA 0.3 mg/mL   Intravenous Q4H  . imipenem-cilastatin  500 mg Intravenous Once  . imipenem-cilastatin  500 mg Intravenous Q6H  . lenalidomide  25 mg Oral Daily  . ondansetron  8 mg Oral Once  . sodium chloride  500 mL Intravenous Once  . sulfamethoxazole-trimethoprim  1 tablet Oral Q M,W,F  . vancomycin  750 mg Intravenous Q8H  . DISCONTD: fentaNYL  25 mcg Transdermal Q72H    Infusions:    . sodium chloride 100 mL/hr at 03/29/11 1748   Assessment: 51 yo male s/p teeth removal 1/31 now with elevated temp.  MD ordered Primaxin/Vancomycin as empiric coverage.   Goal of Therapy:  Vancomycin trough level 15-20 mcg/ml (in setting of multiple myeloma/ decreased WBCs)  Plan:  1.  Primaxin 500mg  IV q6h. 2.  Vancomycin 750mg  IV q8h.  CrCl~ 88 (N) 3.  F/U SCr/Levels as needed  Lorenza Evangelist 03/30/2011,12:29 AM

## 2011-03-30 NOTE — Consult Note (Signed)
[facility] INPATIENT PROGRESS NOTE  Name: Shane Woodard      MRN: [id number]    Location: 1237/1237-01  Date: 03/30/2011 Time:1:15 PM  DIAGNOSIS: IgG lambda multiple myeloma; presented with anemia, and bone pain. Initial M-spike was 5.1gm/dL; free serum lambda of 6.21 mg/dL; Ig G 7760 mg/dL; [id number] of 2.47. Bone marrow biopsy was performed today; result pending including FISH and cytogenetics.   CURRENT THERAPY: started on 03/19/11 [name] 1.3mg /m2 SQ d1, 4,8,11 q28 day; Revlimid 25 mg PO d1-21 q28day; Dexamethasone 40mg  PO weekly (even of week off of chemo). He is also on Acyclovir 400mg  PO BID; Bactrim DS Mon/Wed/Fri; Lovenox 40mg  SQ daily. Zometa is due to start after he is cleared by dentist.  Subjective: Interval History:Shane Woodard had kyphoplasty on 03/27/11 and dental extraction on 03/28/11.  He has severe back pain still yesterday.  Thus, PCA Dilaudid was initiated.  He developed hypotension, fever overnight and was transferred to stepdown.  He reported that when he was in PCA pump, his back pain was very well controlled.  He even wanted to walk.  However, PCA was discontinued overnight due to sepsis, hypotension; and his back pain is now coming back however not as severe as before kyphoplasty.  He has moderate to severe gum pain from dental extraction.  He denies gum bleeding, SOB, CP, abd pain, diarrhea, constipation.  His skin rash has improved significantly; again, he does not have pruritis despite the rash.   Objective: Vital signs in last 24 hours: Temp:  [97.2 F (36.2 C)-105.2 F (40.7 C)] 102.9 F (39.4 C) (02/01 1200) Pulse Rate:  [110-147] 121  (02/01 1200) Resp:  [17-28] 23  (02/01 1200) BP: (73-128)/(39-80) 107/52 mmHg (02/01 1200) SpO2:  [89 %-100 %] 90 % (02/01 1200) FiO2 (%):  [50 %-100 %] 50 % (02/01 0700) Weight:  [156 lb 15.5 oz (71.2 kg)] 156 lb 15.5 oz (71.2 kg) (02/01 0100)    Intake/Output from previous day: 01/31 0701 - 02/01 0700 In: 3989  [I.V.:2839] Out: 1625 [Urine:1625]    PHYSICAL EXAM:  General: well-nourished in pain.   Eyes: no scleral icterus. ENT: packing post dental extraction without brisk bleeding.  Neck was without thyromegaly. Lymphatics: Negative cervical, supraclavicular or axillary adenopathy. Respiratory: lungs were clear bilaterally without wheezing or crackles. Cardiovascular: Regular rate and rhythm, S1/S2, without murmur, rub or gallop. There was no pedal edema. GI: abdomen was soft, flat, nontender, nondistended, without organomegaly. Muscoloskeletal: in brace.   Skin exam showed diffuse, confluent rash in the chest/abdomen, and arms; much improved with less intensity and extend of coverage than yesterday.   Studies/Results: Cr 1.21; Glc 78. WBC 3.1; Hgb 7.3; Plt 86.  Blood/urine culture negative to date.   CRX 03/30/11:  IMPRESSION:  Enlargement of cardiac silhouette with pulmonary vascular  congestion.  Perihilar infiltrates question pulmonary edema versus infection.  Subsegmental atelectasis at lung bases.   MEDICATIONS: reviewed.    Assessment/Plan:  1. IgG lambda multiple myeloma:  - he has had d1,4,8,and 11 of [name] (last dose 03/29/11).  He started his 21 day of Revlimid chemo pill on 03/21/11.  I discontinued Revlimid due to sepsis.   - Zometa will be delayed for about 2-3 wks to allow healing from dental extraction.  - He will see [facility] soon to discuss role of BMT.   2.  Compression fracture: s/p kyphoplasty yesterday with only residual relief of pain.  Agree with holding off on Dilaudid PCA today due to hypotension/sepsis.  When blood  pressure improves, may consider resuming Dilaudid PCA again as it was providing him great relief and also to ascertain analgesic requirement.    3.  Skin rash:  Improving off of MS Contin.    4.  Sepsis:  Either pneumonia vs recent procedure.  I agree with broad emperic coverage with Vancomycin and Imipenem while pending cultures.  5.   Anemia/thrombocytopenia:  No evidence of active bleeding.  Most likely due to myeloma and recent chemo.  Transfuse prn Hgb <7 or plt <10K or active bleeding.   FULL CODE.

## 2011-03-30 NOTE — Progress Notes (Signed)
Name: Thelma Viana MRN: [id number] DOB: Jun 24, 1960  ELECTRONIC ICU PHYSICIAN NOTE  Problem:  Hypotension / fever, on Primaxin / Vancomycin, history of chronic sterioids.  Intervention:  NS 1000 mL IV bolus ordered, Hydrocortisone 50 mg IV q6h ordered for presumed adrenal insufficiency.  Orlean Bradford, M.D. Pulmonary and Critical Care Medicine [facility] Cell: [phone] Pager: [phone]  03/30/2011, 3:35 AM

## 2011-03-30 NOTE — Progress Notes (Signed)
PT Cancellation Note  Treatment cancelled today due to medical issues with patient which prohibited therapy. Note pt now in ICU with increased temp.  Will hold PT and check back with patient on Monday Donnetta Hail 03/30/2011, 8:49 AM

## 2011-03-31 ENCOUNTER — Inpatient Hospital Stay (HOSPITAL_COMMUNITY): Payer: Medicaid Other

## 2011-03-31 DIAGNOSIS — J9600 Acute respiratory failure, unspecified whether with hypoxia or hypercapnia: Secondary | ICD-10-CM

## 2011-03-31 DIAGNOSIS — I509 Heart failure, unspecified: Secondary | ICD-10-CM

## 2011-03-31 LAB — CBC
Hemoglobin: 7.3 g/dL — ABNORMAL LOW (ref 13.0–17.0)
MCHC: 31.9 g/dL (ref 30.0–36.0)
MCHC: 31.9 g/dL (ref 30.0–36.0)
Platelets: 51 10*3/uL — ABNORMAL LOW (ref 150–400)
RDW: 16.7 % — ABNORMAL HIGH (ref 11.5–15.5)
RDW: 16.8 % — ABNORMAL HIGH (ref 11.5–15.5)
WBC: 2.3 10*3/uL — ABNORMAL LOW (ref 4.0–10.5)

## 2011-03-31 LAB — BASIC METABOLIC PANEL
Chloride: 103 mEq/L (ref 96–112)
GFR calc Af Amer: >90 mL/min (ref >90)
GFR calc non Af Amer: >90 mL/min (ref >90)
Potassium: 3.4 mEq/L — ABNORMAL LOW (ref 3.5–5.1)
Sodium: 130 mEq/L — ABNORMAL LOW (ref 135–145)

## 2011-03-31 LAB — VANCOMYCIN, TROUGH: Vancomycin Tr: 11.1 ug/mL (ref 10.0–20.0)

## 2011-03-31 LAB — LACTIC ACID, PLASMA: Lactic Acid, Venous: 0.9 mmol/L (ref 0.5–2.2)

## 2011-03-31 MED ORDER — PANTOPRAZOLE SODIUM 40 MG PO TBEC
40.0000 mg | DELAYED_RELEASE_TABLET | Freq: Every day | ORAL | Status: DC
  Administered 2011-03-31 – 2011-04-04 (×5): 40 mg via ORAL
  Filled 2011-03-31 (×5): qty 1

## 2011-03-31 MED ORDER — VANCOMYCIN HCL IN DEXTROSE 1-5 GM/200ML-% IV SOLN
1000.0000 mg | Freq: Three times a day (TID) | INTRAVENOUS | Status: DC
  Administered 2011-03-31 – 2011-04-02 (×6): 1000 mg via INTRAVENOUS
  Filled 2011-03-31 (×9): qty 200

## 2011-03-31 MED ORDER — FUROSEMIDE 10 MG/ML IJ SOLN
20.0000 mg | Freq: Three times a day (TID) | INTRAMUSCULAR | Status: AC
  Administered 2011-03-31 (×2): 20 mg via INTRAVENOUS
  Filled 2011-03-31 (×2): qty 2

## 2011-03-31 NOTE — Progress Notes (Signed)
ANTIBIOTIC CONSULT NOTE - FOLLOW UP  Pharmacy Consult for Vancomycin/Primaxin Indication: rule out sepsis s/p teeth extraction/rule out aspiration.  No Known Allergies  Patient Measurements: Height: 5\' 6"  (167.6 cm) Weight: 164 lb 0.4 oz (74.4 kg) IBW/kg (Calculated) : 63.8   Vital Signs: Temp: 100.1 F (37.8 C) (02/02 0842) Temp src: Rectal (02/02 0842) BP: 109/71 mmHg (02/02 0900) Pulse Rate: 96  (02/02 0900) Intake/Output from previous day: 02/01 0701 - 02/02 0700 In: 5269.6 [I.V.:2957.1; Blood:462.5; IV Piggyback:1850] Out: 3550 [Urine:3550] Intake/Output from this shift: Total I/O In: 885 [I.V.:635; IV Piggyback:250] Out: 425 [Urine:425]  Labs:  [facility] 03/31/11 0320 03/30/11 2337 03/30/11 1308 03/30/11 1017 03/29/11 0354  WBC 2.3* 2.5* 2.8* -- --  HGB 7.3* 7.5* 6.7* -- --  PLT 48* 51* 54* -- --  LABCREA -- -- -- -- --  CREATININE 0.84 -- -- 1.21 0.98   Estimated Creatinine Clearance: 91.8 ml/min (by C-G formula based on Cr of 0.84).  Basename 03/31/11 0923  VANCOTROUGH 11.1  VANCOPEAK --  VANCORANDOM --  GENTTROUGH --  GENTPEAK --  GENTRANDOM --  TOBRATROUGH --  TOBRAPEAK --  TOBRARND --  AMIKACINPEAK --  AMIKACINTROU --  AMIKACIN --     Microbiology: Recent Results (from the past 720 hour(s))  MRSA PCR SCREENING     Status: Normal   Collection Time   03/30/11  1:29 AM      Component Value Range Status Comment   MRSA by PCR NEGATIVE  NEGATIVE  Final   CULTURE, BLOOD (ROUTINE X 2)     Status: Normal (Preliminary result)   Collection Time   03/30/11  1:30 AM      Component Value Range Status Comment   Specimen Description BLOOD RIGHT ARM   Final    Special Requests     Final    Value: Immunocompromised BOTTLES DRAWN AEROBIC AND ANAEROBIC   Culture  Setup Time [id number]   Final    Culture     Final    Value:        BLOOD CULTURE RECEIVED NO GROWTH TO DATE CULTURE WILL BE HELD FOR 5 DAYS BEFORE ISSUING A FINAL NEGATIVE REPORT   Report Status  PENDING   Incomplete   CULTURE, BLOOD (ROUTINE X 2)     Status: Normal (Preliminary result)   Collection Time   03/30/11  1:35 AM      Component Value Range Status Comment   Specimen Description BLOOD RIGHT HAND   Final    Special Requests     Final    Value: Immunocompromised BOTTLES DRAWN AEROBIC AND ANAEROBIC   Culture  Setup Time [id number]   Final    Culture     Final    Value:        BLOOD CULTURE RECEIVED NO GROWTH TO DATE CULTURE WILL BE HELD FOR 5 DAYS BEFORE ISSUING A FINAL NEGATIVE REPORT   Report Status PENDING   Incomplete     Anti-infectives     Start     Dose/Rate Route Frequency Ordered Stop   03/30/11 0800   imipenem-cilastatin (PRIMAXIN) 500 mg in sodium chloride 0.9 % 100 mL IVPB        500 mg 200 mL/hr over 30 Minutes Intravenous Every 6 hours 03/30/11 0028     03/30/11 0200   vancomycin (VANCOCIN) 750 mg in sodium chloride 0.9 % 150 mL IVPB        750 mg 150 mL/hr over 60 Minutes Intravenous Every  8 hours 03/30/11 0027     03/30/11 0045   imipenem-cilastatin (PRIMAXIN) 500 mg in sodium chloride 0.9 % 100 mL IVPB        500 mg 200 mL/hr over 30 Minutes Intravenous  Once 03/30/11 0025 03/30/11 0115   03/28/11 1000   sulfamethoxazole-trimethoprim (BACTRIM DS,SEPTRA DS) 800-160 MG per tablet 1 tablet  Status:  Discontinued        1 tablet Oral Every M-W-F 03/26/11 1320 03/30/11 1314   03/27/11 1600   ceFAZolin (ANCEF) IVPB 1 g/50 mL premix        1 g 100 mL/hr over 30 Minutes Intravenous 60 min pre-op 03/27/11 1553 03/28/11 0740   03/27/11 0000   ceFAZolin (ANCEF) IVPB 1 g/50 mL premix        1 g 100 mL/hr over 30 Minutes Intravenous  Once 03/26/11 2338 03/27/11 1235   03/26/11 1500   acyclovir (ZOVIRAX) tablet 400 mg        400 mg Oral Daily 03/26/11 1320            Assessment: Vanc trough 11.1 prior to 5th dose of 750 mg q8h. Level lower than desired 15-20 mcg ml: leukopenia/multiple myeloma. Renal fx stable  Goal of Therapy:  Vancomycin  trough level 15-20 mcg/ml  Plan: Continue Primaxin 500mg  IV q6h Change Vancomycin to 1 gm IV q8h, Follow up rpt trough soon to avoid overshoot of levels  Follow up culture results  Otho Bellows PharmD Pager [phone] 03/31/2011,10:53 AM

## 2011-03-31 NOTE — Progress Notes (Signed)
HPI:  This is a 51 year old male who presented to the ED on 1/28 with severe lower back pain. He was found to have multiple pathological fractures due to his multiple myeloma. He was admitted to the floor for kyphoplasty (1/29). He also was noted to have very poor dentition with loose teeth. On 1/30 he was taken to the OR for extraction of all teeth. Post op (1/31) patient became febrile to 105 and hypotensive. Patient was brought to the ICU and PCCM was consulted to evaluate.   Antibiotics:   Acyclovir 1/28 (Home med) >>>  Imipenem 2/1 >>>  Bactrim/Septra 1/30 (PCP prophylaxis) >>>  Vanco 2/1 >>>  Cultures/Sepsis Markers:   Urine 2/1 >>>  BC X2 2/1 >>>  Access/Protocols:   Best Practice: DVT: SCD's GI: Protonix  Events: Kyphoplasty 1/29  Dental extraction 1/30  Rapid response d/t hypotension/fever 2/1  Subjective: Patient feels well this AM but is working hard to breath.  Physical Exam: Filed Vitals:   03/31/11 0900  BP: 109/71  Pulse: 96  Temp:   Resp: 25    Intake/Output Summary (Last 24 hours) at 03/31/11 0925 Last data filed at 03/31/11 0900  Gross per 24 hour  Intake 5612.5 ml  Output   3150 ml  Net 2462.5 ml   Vent Mode:  [-]  FiO2 (%):  [15 %] 15 %  Neuro: Alert and oriented but breathing in the high 20's to low 30's. Cardiac: RRR, Nl S1/S2, -M/R/G. Pulmonary: Diffuse rales. GI: Soft, NT, ND and +BS. Extremities: -edema and -tenderness.  Labs: CBC    Component Value Date/Time   WBC 2.3* 03/31/2011 0320   WBC 4.0 03/08/2011 1105   RBC 2.81* 03/31/2011 0320   RBC 3.72* 03/08/2011 1105   HGB 7.3* 03/31/2011 0320   HGB 9.7* 03/08/2011 1105   HCT 22.9* 03/31/2011 0320   HCT 31.4* 03/08/2011 1105   PLT 48* 03/31/2011 0320   PLT 244 03/08/2011 1105   MCV 81.5 03/31/2011 0320   MCV 84.4 03/08/2011 1105   MCH 26.0 03/31/2011 0320   MCH 26.1* 03/08/2011 1105   MCHC 31.9 03/31/2011 0320   MCHC 30.9* 03/08/2011 1105   RDW 16.7* 03/31/2011 0320   RDW 17.3* 03/08/2011 1105   LYMPHSABS 0.4* 03/30/2011 0130   LYMPHSABS 1.7 03/08/2011 1105   MONOABS 0.1 03/30/2011 0130   MONOABS 0.1 03/08/2011 1105   EOSABS 0.0 03/30/2011 0130   EOSABS 0.3 03/08/2011 1105   BASOSABS 0.0 03/30/2011 0130   BASOSABS 0.0 03/08/2011 1105   BMET    Component Value Date/Time   NA 130* 03/31/2011 0320   K 3.4* 03/31/2011 0320   CL 103 03/31/2011 0320   CO2 21 03/31/2011 0320   GLUCOSE 104* 03/31/2011 0320   BUN 14 03/31/2011 0320   CREATININE 0.84 03/31/2011 0320   CREATININE 1.03 03/13/2011 0812   CALCIUM 9.6 03/31/2011 0320   GFRNONAA >90 03/31/2011 0320   GFRAA >90 03/31/2011 0320   ABG    Component Value Date/Time   PHART 7.399 03/30/2011 1030   PCO2ART 32.4* 03/30/2011 1030   PO2ART 56.3* 03/30/2011 1030   HCO3 19.6* 03/30/2011 1030   TCO2 19.0 03/30/2011 1030   ACIDBASEDEF 4.2* 03/30/2011 1030   O2SAT 90.4 03/30/2011 1030    No results found for this basename: MG in the last 168 hours Lab Results  Component Value Date   CALCIUM 9.6 03/31/2011   PHOS 2.9 10/09/2007    Chest Xray:   Assessment & Plan:  Pulmonary: CXR revealing diffuse infiltrate vs pulmonary edema.  Patient is breathing 30 times per minute.  O2 is acceptable but work of breathing is significant. Plan: Will make BiPAP available PRN.  Titrate O2.  Lasix as ordered.  SIRS/Septic Shock- patient with hypotension, fever. Source most likely aspiration s/p dental extraction Hypotension exacerbated by narcotics.  Concern also for PNA, patient has diffuse airspace disease.  Lab  03/30/11 0130  03/29/11 0746  03/27/11 0315  03/26/11 0420   PROCALCITON  39.12  --  --  --   WBC  3.1*  2.7*  3.8*  2.8*   LATICACIDVEN  1.7  --  --  --   Plan:  -NS bolus, if remains hypotensive will place CVL  -Hold dilaudid PCA for now  -Lactic acid 0.9, procal 31.21. -Continue empiric Vanco, Imipenem. -Continue Solu-cortef- patient takes Decadron at home   Anemia- secondary to MM   Lab  03/30/11 0130  03/29/11 0746  03/27/11 0315   HGB  7.3*  8.5*  9.1*     Plan:  -Will re-check CBC this afternoon  -Follow CBC   Thrombocytopenia now down to 48.  Patient has pan cytopenia.  I am hoping this is due to sepsis resulting in bone marrow suppression vs MM and its treatment.  H/O following.  This is clearly very concerning.  Lab  03/30/11 0130  03/29/11 0746  03/27/11 0315   PLT  86*  116*  210   Plan:  -monitor CBC   Hyponatremia   Lab  03/29/11 0354  03/27/11 0315  03/26/11 0420   NA  131*  128*  133*   Plan:  -Monitor BMP . -KVO IVF after CXR have been reviewed.  Multiple myeloma- Therapy started 1/21  Plan:  -Per Dr. Gaylyn Rong   S/p dental extraction (1/30)  Plan:  -Follow dental recs  -Good oral hygiene   S/p kyphoplasty  Plan:  -follow IR recs  Given respiratory situation, CXR and low WBC/plat, will keep here under observation for another day.  I am very concerned about his respiratory condition, he is not quite at the point of needing intubation yet but I am afraid its coming.  I hope the lasix will at least address some of these respiratory concerns.  CC time 35 min.  Koren Bound, MD [phone]

## 2011-04-01 ENCOUNTER — Inpatient Hospital Stay (HOSPITAL_COMMUNITY): Payer: Medicaid Other

## 2011-04-01 DIAGNOSIS — J9600 Acute respiratory failure, unspecified whether with hypoxia or hypercapnia: Secondary | ICD-10-CM

## 2011-04-01 DIAGNOSIS — I509 Heart failure, unspecified: Secondary | ICD-10-CM

## 2011-04-01 DIAGNOSIS — A419 Sepsis, unspecified organism: Secondary | ICD-10-CM

## 2011-04-01 DIAGNOSIS — E876 Hypokalemia: Secondary | ICD-10-CM

## 2011-04-01 LAB — CBC
HCT: 25.1 % — ABNORMAL LOW (ref 39.0–52.0)
Hemoglobin: 8.2 g/dL — ABNORMAL LOW (ref 13.0–17.0)
RDW: 16.9 % — ABNORMAL HIGH (ref 11.5–15.5)
WBC: 2.5 10*3/uL — ABNORMAL LOW (ref 4.0–10.5)

## 2011-04-01 LAB — URINE CULTURE: Culture  Setup Time: 201302020115

## 2011-04-01 LAB — BASIC METABOLIC PANEL
GFR calc Af Amer: >90 mL/min (ref >90)
GFR calc non Af Amer: >90 mL/min (ref >90)
Potassium: 3.2 mEq/L — ABNORMAL LOW (ref 3.5–5.1)
Sodium: 134 mEq/L — ABNORMAL LOW (ref 135–145)

## 2011-04-01 LAB — PHOSPHORUS: Phosphorus: 3.2 mg/dL (ref 2.3–4.6)

## 2011-04-01 MED ORDER — OXYCODONE-ACETAMINOPHEN 5-325 MG PO TABS
1.0000 | ORAL_TABLET | ORAL | Status: DC | PRN
  Administered 2011-04-01 – 2011-04-02 (×5): 1 via ORAL
  Administered 2011-04-03 – 2011-04-04 (×5): 2 via ORAL
  Filled 2011-04-01: qty 2
  Filled 2011-04-01 (×3): qty 1
  Filled 2011-04-01 (×4): qty 2
  Filled 2011-04-01 (×2): qty 1

## 2011-04-01 MED ORDER — MAGNESIUM SULFATE 50 % IJ SOLN: 1.0000 g | Freq: Once | INTRAVENOUS | Status: DC

## 2011-04-01 MED ORDER — DOCUSATE SODIUM 100 MG PO CAPS
100.0000 mg | ORAL_CAPSULE | Freq: Once | ORAL | Status: DC
  Filled 2011-04-01: qty 1

## 2011-04-01 MED ORDER — POTASSIUM CHLORIDE 10 MEQ/100ML IV SOLN
10.0000 meq | INTRAVENOUS | Status: AC
  Administered 2011-04-01 (×4): 10 meq via INTRAVENOUS
  Filled 2011-04-01: qty 400

## 2011-04-01 MED ORDER — DOCUSATE SODIUM 50 MG/5ML PO LIQD
100.0000 mg | Freq: Two times a day (BID) | ORAL | Status: DC
  Filled 2011-04-01 (×2): qty 10

## 2011-04-01 MED ORDER — MAGNESIUM SULFATE IN D5W 10-5 MG/ML-% IV SOLN
1.0000 g | Freq: Once | INTRAVENOUS | Status: AC
  Administered 2011-04-01: 1 g via INTRAVENOUS
  Filled 2011-04-01: qty 100

## 2011-04-01 MED ORDER — FUROSEMIDE 10 MG/ML IJ SOLN
20.0000 mg | Freq: Three times a day (TID) | INTRAMUSCULAR | Status: AC
  Administered 2011-04-01 (×2): 20 mg via INTRAVENOUS
  Filled 2011-04-01 (×2): qty 2

## 2011-04-01 MED ORDER — FENTANYL 25 MCG/HR TD PT72
25.0000 ug | MEDICATED_PATCH | TRANSDERMAL | Status: DC
  Administered 2011-04-01 – 2011-04-04 (×2): 25 ug via TRANSDERMAL
  Filled 2011-04-01 (×2): qty 1

## 2011-04-01 MED ORDER — DOCUSATE SODIUM 100 MG PO CAPS
100.0000 mg | ORAL_CAPSULE | Freq: Two times a day (BID) | ORAL | Status: AC
  Administered 2011-04-01 (×2): 100 mg via ORAL
  Filled 2011-04-01 (×2): qty 1

## 2011-04-01 MED ORDER — MAGNESIUM SULFATE 40 MG/ML IJ SOLN
2.0000 g | Freq: Once | INTRAMUSCULAR | Status: AC
  Administered 2011-04-01: 2 g via INTRAVENOUS
  Filled 2011-04-01: qty 50

## 2011-04-01 MED ORDER — POTASSIUM CHLORIDE CRYS ER 20 MEQ PO TBCR
40.0000 meq | EXTENDED_RELEASE_TABLET | Freq: Three times a day (TID) | ORAL | Status: AC
  Administered 2011-04-01 (×2): 40 meq via ORAL
  Filled 2011-04-01 (×2): qty 2

## 2011-04-01 MED ORDER — MAGNESIUM SULFATE BOLUS VIA INFUSION: 2.0000 g | Freq: Once | INTRAVENOUS | Status: DC

## 2011-04-01 NOTE — Progress Notes (Signed)
HPI:  This is a 51 year old male who presented to the ED on 1/28 with severe lower back pain. He was found to have multiple pathological fractures due to his multiple myeloma. He was admitted to the floor for kyphoplasty (1/29). He also was noted to have very poor dentition with loose teeth. On 1/30 he was taken to the OR for extraction of all teeth. Post op (1/31) patient became febrile to 105 and hypotensive. Patient was brought to the ICU and PCCM was consulted to evaluate.   Antibiotics:   Acyclovir 1/28 (Home med) >>>  Imipenem 2/1 >>>  Bactrim/Septra 1/30 (PCP prophylaxis) >>>  Vanco 2/1 >>>  Cultures/Sepsis Markers:   Urine 2/1 >>>  BC X2 2/1 >>>  Access/Protocols:   Best Practice: DVT: SCD's GI: Protonix  Events: Kyphoplasty 1/29  Dental extraction 1/30  Rapid response d/t hypotension/fever 2/1  Subjective: Patient feels well this AM but is working hard to breath.  Physical Exam: Filed Vitals:   04/01/11 0800  BP: 92/60  Pulse: 72  Temp: 97.6 F (36.4 C)  Resp: 19    Intake/Output Summary (Last 24 hours) at 04/01/11 0859 Last data filed at 04/01/11 0600  Gross per 24 hour  Intake   1290 ml  Output   3225 ml  Net  -1935 ml      Neuro: Alert and oriented but breathing in the high 20's to low 30's. Cardiac: RRR, Nl S1/S2, -M/R/G. Pulmonary: Diffuse rales. GI: Soft, NT, ND and +BS. Extremities: -edema and -tenderness.  Labs: CBC    Component Value Date/Time   WBC 2.5* 04/01/2011 0314   WBC 4.0 03/08/2011 1105   RBC 3.12* 04/01/2011 0314   RBC 3.72* 03/08/2011 1105   HGB 8.2* 04/01/2011 0314   HGB 9.7* 03/08/2011 1105   HCT 25.1* 04/01/2011 0314   HCT 31.4* 03/08/2011 1105   PLT 40* 04/01/2011 0314   PLT 244 03/08/2011 1105   MCV 80.4 04/01/2011 0314   MCV 84.4 03/08/2011 1105   MCH 26.3 04/01/2011 0314   MCH 26.1* 03/08/2011 1105   MCHC 32.7 04/01/2011 0314   MCHC 30.9* 03/08/2011 1105   RDW 16.9* 04/01/2011 0314   RDW 17.3* 03/08/2011 1105   LYMPHSABS 0.4* 03/30/2011  0130   LYMPHSABS 1.7 03/08/2011 1105   MONOABS 0.1 03/30/2011 0130   MONOABS 0.1 03/08/2011 1105   EOSABS 0.0 03/30/2011 0130   EOSABS 0.3 03/08/2011 1105   BASOSABS 0.0 03/30/2011 0130   BASOSABS 0.0 03/08/2011 1105   BMET    Component Value Date/Time   NA 134* 04/01/2011 0314   K 3.2* 04/01/2011 0314   CL 103 04/01/2011 0314   CO2 25 04/01/2011 0314   GLUCOSE 113* 04/01/2011 0314   BUN 25* 04/01/2011 0314   CREATININE 0.88 04/01/2011 0314   CREATININE 1.03 03/13/2011 0812   CALCIUM 9.5 04/01/2011 0314   GFRNONAA >90 04/01/2011 0314   GFRAA >90 04/01/2011 0314   ABG    Component Value Date/Time   PHART 7.399 03/30/2011 1030   PCO2ART 32.4* 03/30/2011 1030   PO2ART 56.3* 03/30/2011 1030   HCO3 19.6* 03/30/2011 1030   TCO2 19.0 03/30/2011 1030   ACIDBASEDEF 4.2* 03/30/2011 1030   O2SAT 90.4 03/30/2011 1030    Lab 04/01/11 0314  MG 1.8   Lab Results  Component Value Date   CALCIUM 9.5 04/01/2011   PHOS 3.2 04/01/2011    Chest Xray:   Assessment & Plan:  Pulmonary: CXR revealing diffuse infiltrate vs pulmonary  edema.  Responded nicely to diuretics. Plan: D/C BiPAP.  Titrate O2.  Additional low dose lasix today.  SIRS/Septic Shock- patient with hypotension, fever. Source most likely aspiration s/p dental extraction Hypotension exacerbated by narcotics.  Concern also for PNA, patient has diffuse airspace disease.  Lab  03/30/11 0130  03/29/11 0746  03/27/11 0315  03/26/11 0420   PROCALCITON  39.12  --  --  --   WBC  3.1*  2.7*  3.8*  2.8*   LATICACIDVEN  1.7  --  --  --   Plan:  -KVO all IVF. -Hold dilaudid PCA for now  -Lactic acid 0.9, procal 31.21. -Continue empiric Vanco, Imipenem. -Continue Solu-cortef- patient takes Decadron at home   Anemia- secondary to MM   Lab  03/30/11 0130  03/29/11 0746  03/27/11 0315   HGB  7.3*  8.5*  9.1*   Plan:  -Will re-check CBC this afternoon  -Follow CBC   Thrombocytopenia now down to 48.  Patient has pan cytopenia.  I am hoping this is due to sepsis  resulting in bone marrow suppression vs MM and its treatment.  H/O following.  This is clearly very concerning.  Lab  03/30/11 0130  03/29/11 0746  03/27/11 0315   PLT  86*  116*  210   Plan:  -monitor CBC   Hyponatremia   Lab  03/29/11 0354  03/27/11 0315  03/26/11 0420   NA  131*  128*  133*   Plan:  -Monitor BMP . -KVO IVF after CXR have been reviewed.  Multiple myeloma- Therapy started 1/21  Plan:  -Per Dr. Gaylyn Rong   S/p dental extraction (1/30)  Plan:  -Follow dental recs  -Good oral hygiene   S/p kyphoplasty  Plan:  -follow IR recs  Will diurese today, if tolerated well, should be able to sign off in AM and transfer out of SDU.  Koren Bound, MD [phone]

## 2011-04-01 NOTE — Progress Notes (Signed)
eLink Physician-Brief Progress Note Patient Name: Shane Woodard DOB: 10-May-1960 MRN: [id number]  Date of Service  04/01/2011   HPI/Events of Note   mag 1.8 k 3.2 Creat 0.88  eICU Interventions  Replete k and mag   Intervention Category Intermediate Interventions: Electrolyte abnormality - evaluation and management  [name] 04/01/2011, 5:28 AM

## 2011-04-01 NOTE — Progress Notes (Signed)
Dr. Michele Rockers made aware of patient's low BP. Patient is asymptomatic at this time- resting and watching TV. MD is content with BP, no further action to be taken at this time. Will continue to monitor patient.

## 2011-04-01 NOTE — Consult Note (Signed)
[facility] INPATIENT PROGRESS NOTE  Name: Shane Woodard      MRN: [id number]    Location: 1237/1237-01  Date: 04/01/2011 Time:8:56 AM  DIAGNOSIS: IgG lambda multiple myeloma; presented with anemia, and bone pain. Initial M-spike was 5.1gm/dL; free serum lambda of 7.82 mg/dL; Ig G 7760 mg/dL; NFAO-1-[id number] of 2.47. Bone marrow biopsy showed 35% plasma cell; cytogenetics per FISH was positive for t(11;14).   CURRENT THERAPY: started on 03/19/11 [name] 1.3mg /m2 SQ d1, 4,8,11 q28 day; Revlimid 25 mg PO d1-21 q28day; Dexamethasone 40mg  PO weekly (even of week off of chemo). He was also on prophylactic Acyclovir 400mg  PO BID; Bactrim DS Mon/Wed/Fri; Lovenox 40mg  SQ daily. Zometa is due to start after he is cleared by dentist.  His current Revlimid was discontinued on 03/30/11 due to sepsis.   Subjective: Interval History:Shane Woodard had kyphoplasty on 03/27/11 and dental extraction on 03/28/11.  He still has back pain; severity is now 5/10.  His pain is relatively controlled with Fentanyl. He has not had fever for last 24 hours. His gum pain has improved as well.  His skin rash has significantly improved; and like before, there has been no pruritus.  He has obstipation; but no abdominal pain.  He denies headache, mucositis, SOB, CP, abdominal pain, bleeding symptoms.   Objective: Vital signs in last 24 hours: Temp:  [97.5 F (36.4 C)-99.4 F (37.4 C)] 97.6 F (36.4 C) (02/03 0800) Pulse Rate:  [71-96] 72  (02/03 0800) Resp:  [16-29] 19  (02/03 0800) BP: (90-112)/(59-73) 92/60 mmHg (02/03 0800) SpO2:  [79 %-100 %] 100 % (02/03 0800) Weight:  [158 lb 1.1 oz (71.7 kg)] 158 lb 1.1 oz (71.7 kg) (02/03 0000)    Intake/Output from previous day: 02/02 0701 - 02/03 0700 In: 1890 [I.V.:635] Out: 3225 [Urine:3225]    PHYSICAL EXAM:  General: well-nourished in pain.   Eyes: no scleral icterus. ENT: packing post dental extraction without brisk bleeding.  Neck was without thyromegaly.  Lymphatics: Negative cervical, supraclavicular or axillary adenopathy. Respiratory: lungs were clear bilaterally without wheezing or crackles. Cardiovascular: Regular rate and rhythm, S1/S2, without murmur, rub or gallop. There was no pedal edema. GI: abdomen was soft, flat, nontender, nondistended, without organomegaly. Muscoloskeletal: in brace.   Skin exam showed mild diffuse rash worse in trunk and arms; improved from last week.  MS:  Pain in lower back with movement.   Studies/Results: Cr 0.88; Ca 9.5. WBC 2.5; Hgb 8.2; Plt 40.  Blood/urine culture negative to date.   CRX 04/01/11:  IMPRESSION: improved aeration.   MEDICATIONS: reviewed.    Assessment/Plan:  1. IgG lambda multiple myeloma:  - he has had d1,4,8,and 11 of [name] (last dose 03/29/11).  He started his 21 day of Revlimid chemo pill on 03/21/11.  Revlimid was discontinued on 03/30/11 due to sepsis.   - Zometa will be delayed for about 2-3 wks to allow healing from dental extraction.  - He will see [facility] soon to discuss role of BMT.   2.  Compression fracture: s/p kyphoplasty last week with residual pain (but improved from prior).   As blood pressure has improved from resolution of sepsis, recommended to consider starting long acting and short acting pain regimen with goal to D/C home in 2-3 days on a home analgesic pain regimen.  He required Fentanyl 50 mcg x 7 doses yesterday.  This converted to about 15mg  IV morphine a day.  Thus, a reasonable conversion would be Fentanyl patch 65mcg/hour change 72 hours  for long acting.    For short acting, he was taking Percocet at home which was not helping him much.  He was responding here to Dilaudid.  Thus, Dilaudid 4mg  PO q4-6 hours may be used for breakthrough.   3.  Skin rash:  Improving off of MS Contin.    4.  Sepsis:  Either pneumonia vs recent procedure. No more sepsis physiology.  He has not required pressor or IVF bolus.  Continue with day 3 of broad emperic coverage with  Vancomycin and Imipenem until cultures are finalized next 2 days.   5.  Anemia/thrombocytopenia:  No evidence of active bleeding.  Most likely due to myeloma and recent chemo.  Transfuse prn Hgb <7 or plt <10K or active bleeding.   Hold of of Lovenox prophylaxis until plt improved.   6.  Hypokalemia:  Due to chemo.  He's getting IV KCl.   7.  Deconditioning due to therapy, acute illness.  I encouraged him to get out of into chair TID.   FULL CODE.

## 2011-04-01 NOTE — Significant Event (Signed)
Rapid Response Event Note  Overview: Time Called: 0020 Arrival Time: 0024 Event Type: Other (Comment) (inc HR/temp, dec sats, lethargic)  Initial Focused Assessment: Pt fever 105, HR 130s ST EKG confirms, no ST elevation/depression, SBP 90-110. dec sats, pt placed on non-rebreather, lethargic. Pt also has diffuse elevated, erythematous rash without itch. Pt arouses to voices and follows commands. Lungs: rhonchi throughout. Pt is POD#2 kyphoplasty and POD#1 removal of teeth for infection. [facility] NP floor coverage at bedside.  Interventions: EKG done, PCXR done, lactate/procalcitonin/BCx2 ordered and transfer to SD to r/o sepsis.  Event Summary: Name of Physician Notified: Westley Hummer, RN at 0040    at          Shane Woodard

## 2011-04-02 ENCOUNTER — Other Ambulatory Visit: Payer: Self-pay | Admitting: Radiation Oncology

## 2011-04-02 LAB — BASIC METABOLIC PANEL
BUN: 28 mg/dL — ABNORMAL HIGH (ref 6–23)
Chloride: 104 mEq/L (ref 96–112)
GFR calc Af Amer: >90 mL/min (ref >90)
Potassium: 3.3 mEq/L — ABNORMAL LOW (ref 3.5–5.1)
Sodium: 134 mEq/L — ABNORMAL LOW (ref 135–145)

## 2011-04-02 LAB — CBC
HCT: 24.8 % — ABNORMAL LOW (ref 39.0–52.0)
Hemoglobin: 8 g/dL — ABNORMAL LOW (ref 13.0–17.0)
RDW: 16.8 % — ABNORMAL HIGH (ref 11.5–15.5)
WBC: 2.7 10*3/uL — ABNORMAL LOW (ref 4.0–10.5)

## 2011-04-02 LAB — PHOSPHORUS: Phosphorus: 2.9 mg/dL (ref 2.3–4.6)

## 2011-04-02 MED ORDER — MOXIFLOXACIN HCL 400 MG PO TABS
400.0000 mg | ORAL_TABLET | Freq: Every day | ORAL | Status: DC
  Administered 2011-04-02 – 2011-04-03 (×2): 400 mg via ORAL
  Filled 2011-04-02 (×3): qty 1

## 2011-04-02 MED ORDER — LACTULOSE 10 GM/15ML PO SOLN
30.0000 g | Freq: Once | ORAL | Status: AC
  Administered 2011-04-02: 30 g via ORAL
  Filled 2011-04-02: qty 45

## 2011-04-02 MED ORDER — POTASSIUM CHLORIDE CRYS ER 20 MEQ PO TBCR
40.0000 meq | EXTENDED_RELEASE_TABLET | Freq: Once | ORAL | Status: AC
  Administered 2011-04-02: 40 meq via ORAL
  Filled 2011-04-02: qty 2

## 2011-04-02 MED ORDER — POTASSIUM CHLORIDE CRYS ER 20 MEQ PO TBCR
20.0000 meq | EXTENDED_RELEASE_TABLET | Freq: Two times a day (BID) | ORAL | Status: DC
  Administered 2011-04-02 – 2011-04-04 (×5): 20 meq via ORAL
  Filled 2011-04-02 (×6): qty 1

## 2011-04-02 MED ORDER — HYDROCORTISONE SOD SUCCINATE 100 MG IJ SOLR
25.0000 mg | Freq: Four times a day (QID) | INTRAMUSCULAR | Status: DC
  Administered 2011-04-02: 25 mg via INTRAVENOUS
  Administered 2011-04-02: 17:00:00 via INTRAVENOUS
  Administered 2011-04-03: 25 mg via INTRAVENOUS
  Filled 2011-04-02 (×7): qty 0.5

## 2011-04-02 NOTE — Progress Notes (Signed)
HPI:  This is a 51 year old male who presented to the ED on 1/28 with severe lower back pain. He was found to have multiple pathological fractures due to his multiple myeloma. He was admitted to the floor for kyphoplasty (1/29). He also was noted to have very poor dentition with loose teeth. On 1/30 he was taken to the OR for extraction of all teeth. Post op (1/31) patient became febrile to 105 and hypotensive. Patient was brought to the ICU and PCCM was consulted to evaluate.   Antibiotics:   Acyclovir 1/28 (Home med) >>>  Imipenem 2/1 >>>  Bactrim/Septra 1/30 (PCP prophylaxis) >>>  Vanco 2/1 >>>  Cultures/Sepsis Markers:   Urine 2/1 >>> ng BC X2 2/1 >>>ng  Access/Protocols:   Best Practice: DVT: SCD's GI: Protonix  Events: Kyphoplasty 1/29  Dental extraction 1/30  Rapid response d/t hypotension/fever 2/1  Subjective: Patient feels well this AM but is working hard to breath.  Physical Exam: Filed Vitals:   04/02/11 1243  BP:   Pulse:   Temp: 97.5 F (36.4 C)  Resp:     Intake/Output Summary (Last 24 hours) at 04/02/11 1254 Last data filed at 04/02/11 1000  Gross per 24 hour  Intake   1960 ml  Output   1825 ml  Net    135 ml      Neuro: Alert and oriented but breathing in the high 20's to low 30's. Cardiac: RRR, Nl S1/S2, -M/R/G. Pulmonary: Diffuse rales. GI: Soft, NT, ND and +BS. Extremities: -edema and -tenderness.  Labs: CBC    Component Value Date/Time   WBC 2.7* 04/02/2011 0310   WBC 4.0 03/08/2011 1105   RBC 3.05* 04/02/2011 0310   RBC 3.72* 03/08/2011 1105   HGB 8.0* 04/02/2011 0310   HGB 9.7* 03/08/2011 1105   HCT 24.8* 04/02/2011 0310   HCT 31.4* 03/08/2011 1105   PLT 44* 04/02/2011 0310   PLT 244 03/08/2011 1105   MCV 81.3 04/02/2011 0310   MCV 84.4 03/08/2011 1105   MCH 26.2 04/02/2011 0310   MCH 26.1* 03/08/2011 1105   MCHC 32.3 04/02/2011 0310   MCHC 30.9* 03/08/2011 1105   RDW 16.8* 04/02/2011 0310   RDW 17.3* 03/08/2011 1105   LYMPHSABS 0.4* 03/30/2011 0130     LYMPHSABS 1.7 03/08/2011 1105   MONOABS 0.1 03/30/2011 0130   MONOABS 0.1 03/08/2011 1105   EOSABS 0.0 03/30/2011 0130   EOSABS 0.3 03/08/2011 1105   BASOSABS 0.0 03/30/2011 0130   BASOSABS 0.0 03/08/2011 1105   BMET    Component Value Date/Time   NA 134* 04/02/2011 0310   K 3.3* 04/02/2011 0310   CL 104 04/02/2011 0310   CO2 24 04/02/2011 0310   GLUCOSE 147* 04/02/2011 0310   BUN 28* 04/02/2011 0310   CREATININE 0.80 04/02/2011 0310   CREATININE 1.03 03/13/2011 0812   CALCIUM 8.9 04/02/2011 0310   GFRNONAA >90 04/02/2011 0310   GFRAA >90 04/02/2011 0310   ABG    Component Value Date/Time   PHART 7.399 03/30/2011 1030   PCO2ART 32.4* 03/30/2011 1030   PO2ART 56.3* 03/30/2011 1030   HCO3 19.6* 03/30/2011 1030   TCO2 19.0 03/30/2011 1030   ACIDBASEDEF 4.2* 03/30/2011 1030   O2SAT 90.4 03/30/2011 1030    Lab 04/02/11 0310  MG 2.1   Lab Results  Component Value Date   CALCIUM 8.9 04/02/2011   PHOS 2.9 04/02/2011     Assessment & Plan:  SIRS/Septic Shock (resolved). Source most likely aspiration  s/p dental extraction Hypotension exacerbated by narcotics.   Plan:  -begin Solu-Cortef taper, can change back to home Decadron dosing -stop vancomycin -Would set to stop date for antibiotics at 8-10 days. Will narrow to Avelox  Pulmonary infiltrates: Responded nicely to diuretics. Now on room air. Plan:  -XR 2/3 improved  Anemia- secondary to MM   Lab 04/02/11 0310 04/01/11 0314 03/31/11 0320  HGB 8.0* 8.2* 7.3*  Plan:  -Wper heme/onc  Thrombocytopenia now down to 48.   Lab 04/02/11 0310 04/01/11 0314 03/31/11 0320  PLT 44* 40* 48*  Plan:  -monitor CBC, recs per Heme  Hyponatremia : stable   Lab 04/02/11 0310 04/01/11 0314 03/31/11 0320  NA 134* 134* 130*   Plan:  -Monitor BMP . -KVO IVF after CXR have been reviewed.  Multiple myeloma- Therapy started 1/21  Plan:  -Per Dr. Gaylyn Rong   S/p dental extraction (1/30)  Plan:  -Follow dental recs  -Good oral hygiene   S/p kyphoplasty  Plan:   -follow IR recs  Disposition Spoke with Dr. Gaylyn Rong he has agreed to assume primary care, we will transferred to medical ward. [facility] and will sign off.   Janeece Blok V.

## 2011-04-02 NOTE — Progress Notes (Signed)
ANTIBIOTIC CONSULT NOTE - FOLLOW UP  Pharmacy Consult for Vancomycin and Primaxin Indication: rule out sepsis s/p teeth extraction, rule out aspiration pneumonia  No Known Allergies  Patient Measurements: Height: 5\' 6"  (167.6 cm) Weight: 150 lb 5.7 oz (68.2 kg) IBW/kg (Calculated) : 63.8   Vital Signs: Temp: 97.4 F (36.3 C) (02/04 0800) Temp src: Oral (02/04 0800) BP: 105/65 mmHg (02/04 0900) Pulse Rate: 67  (02/04 0900)  Labs:  Basename 04/02/11 0310 04/01/11 0314 03/31/11 0320  WBC 2.7* 2.5* 2.3*  HGB 8.0* 8.2* 7.3*  PLT 44* 40* 48*  LABCREA -- -- --  CREATININE 0.80 0.88 0.84   Estimated Creatinine Clearance: 96.4 ml/min (by C-G formula based on Cr of 0.8).  Basename 03/31/11 0923  VANCOTROUGH 11.1  VANCOPEAK --  VANCORANDOM --  GENTTROUGH --  GENTPEAK --  GENTRANDOM --  TOBRATROUGH --  TOBRAPEAK --  TOBRARND --  AMIKACINPEAK --  AMIKACINTROU --  AMIKACIN --     Microbiology: Recent Results (from the past 720 hour(s))  MRSA PCR SCREENING     Status: Normal   Collection Time   03/30/11  1:29 AM      Component Value Range Status Comment   MRSA by PCR NEGATIVE  NEGATIVE  Final   CULTURE, BLOOD (ROUTINE X 2)     Status: Normal (Preliminary result)   Collection Time   03/30/11  1:30 AM      Component Value Range Status Comment   Specimen Description BLOOD RIGHT ARM   Final    Special Requests     Final    Value: Immunocompromised BOTTLES DRAWN AEROBIC AND ANAEROBIC   Culture  Setup Time [id number]   Final    Culture     Final    Value:        BLOOD CULTURE RECEIVED NO GROWTH TO DATE CULTURE WILL BE HELD FOR 5 DAYS BEFORE ISSUING A FINAL NEGATIVE REPORT   Report Status PENDING   Incomplete   CULTURE, BLOOD (ROUTINE X 2)     Status: Normal (Preliminary result)   Collection Time   03/30/11  1:35 AM      Component Value Range Status Comment   Specimen Description BLOOD RIGHT HAND   Final    Special Requests     Final    Value: Immunocompromised BOTTLES  DRAWN AEROBIC AND ANAEROBIC   Culture  Setup Time [id number]   Final    Culture     Final    Value:        BLOOD CULTURE RECEIVED NO GROWTH TO DATE CULTURE WILL BE HELD FOR 5 DAYS BEFORE ISSUING A FINAL NEGATIVE REPORT   Report Status PENDING   Incomplete   URINE CULTURE     Status: Normal   Collection Time   03/30/11 11:03 AM      Component Value Range Status Comment   Specimen Description URINE, CLEAN CATCH   Final    Special Requests NONE   Final    Culture  Setup Time [id number]   Final    Colony Count NO GROWTH   Final    Culture NO GROWTH   Final    Report Status 04/01/2011 FINAL   Final     Anti-infectives     Start     Dose/Rate Route Frequency Ordered Stop   03/31/11 1800   vancomycin (VANCOCIN) IVPB 1000 mg/200 mL premix        1,000 mg 200 mL/hr over 60 Minutes Intravenous  Every 8 hours 03/31/11 1058     03/30/11 0800   imipenem-cilastatin (PRIMAXIN) 500 mg in sodium chloride 0.9 % 100 mL IVPB        500 mg 200 mL/hr over 30 Minutes Intravenous Every 6 hours 03/30/11 0028     03/30/11 0200   vancomycin (VANCOCIN) 750 mg in sodium chloride 0.9 % 150 mL IVPB  Status:  Discontinued        750 mg 150 mL/hr over 60 Minutes Intravenous Every 8 hours 03/30/11 0027 03/31/11 1058   03/30/11 0045   imipenem-cilastatin (PRIMAXIN) 500 mg in sodium chloride 0.9 % 100 mL IVPB        500 mg 200 mL/hr over 30 Minutes Intravenous  Once 03/30/11 0025 03/30/11 0115   03/28/11 1000   sulfamethoxazole-trimethoprim (BACTRIM DS,SEPTRA DS) 800-160 MG per tablet 1 tablet  Status:  Discontinued        1 tablet Oral Every M-W-F 03/26/11 1320 03/30/11 1314   03/27/11 1600   ceFAZolin (ANCEF) IVPB 1 g/50 mL premix        1 g 100 mL/hr over 30 Minutes Intravenous 60 min pre-op 03/27/11 1553 03/28/11 0740   03/27/11 0000   ceFAZolin (ANCEF) IVPB 1 g/50 mL premix        1 g 100 mL/hr over 30 Minutes Intravenous  Once 03/26/11 2338 03/27/11 1235   03/26/11 1500   acyclovir (ZOVIRAX)  tablet 400 mg        400 mg Oral Daily 03/26/11 1320            Assessment:  51 yo M admit with multiple myeloma s/p dental extraction, now r/o aspiration pna, r/o sepsis  Vancomycin dose was increased for low trough level   Renal fx appears stable  Goal of Therapy:  Vancomycin trough level 15-20 mcg/ml  Plan:  Continue Primaxin 500mg  IV q6h  Continue Vancomycin to 1 gm IV q8h  Vancomycin trough level 2/4 to assess increased vancomycin dose  Follow up culture results, labs, renal function as available    Lynann Beaver PharmD  Pager [phone] 04/02/2011 11:30 AM

## 2011-04-02 NOTE — Progress Notes (Signed)
[facility] INPATIENT PROGRESS NOTE  Name: Shane Woodard      MRN: [id number]    Location: 1237/1237-01  Date: 04/02/2011 Time:1:08 PM    DIAGNOSIS: IgG lambda multiple myeloma; presented with anemia, and bone pain. Initial M-spike was 5.1gm/dL; free serum lambda of 7.82 mg/dL; Ig G 7760 mg/dL; NFAO-1-HYQMVHQIONGEX of 2.47. Bone marrow biopsy showed 35% plasma cell; cytogenetics per FISH was positive for t(11;14).   CURRENT THERAPY: started on 03/19/11 [name] 1.3mg /m2 SQ d1, 4,8,11 q28 day; Revlimid 25 mg PO d1-21 q28day; Dexamethasone 40mg  PO weekly (even of week off of chemo). He was also on prophylactic Acyclovir 400mg  PO BID; Bactrim DS Mon/Wed/Fri; Lovenox 40mg  SQ daily. Zometa is due to start after he is cleared by dentist. His current Revlimid was discontinued on 03/30/11 due to sepsis.   Subjective: Interval History:Jazier Sutphen reported feeling much better.  His back pain is now less than 5/10.  He is tolerating Fentanyl patch 12mcg/hr and needed about 4x/day of Percocet for breakthrough pain.  He now has much less back pain with movement.  He was even able to sit in the chair yesterday.  At rest, he no longer has back pain.  He has not had BM for more than 5 days.  He denies fever, headache, mucositis, gum bleed, SOB, CP, abd pain, bleeding symtpoms, skin rash.   Objective: Vital signs in last 24 hours: Temp:  [96.5 F (35.8 C)-97.6 F (36.4 C)] 97.5 F (36.4 C) (02/04 1243) Pulse Rate:  [62-86] 64  (02/04 1158) Resp:  [13-21] 14  (02/04 1158) BP: (83-109)/(49-72) 109/72 mmHg (02/04 1158) SpO2:  [94 %-100 %] 95 % (02/04 1158) Weight:  [150 lb 5.7 oz (68.2 kg)] 150 lb 5.7 oz (68.2 kg) (02/04 0400)     PHYSICAL EXAM:  General: well-nourished in pain. Eyes: no scleral icterus. ENT: packing post dental extraction without brisk bleeding. Neck was without thyromegaly. Lymphatics: Negative cervical, supraclavicular or axillary adenopathy. Respiratory: lungs were clear bilaterally  without wheezing or crackles. Cardiovascular: Regular rate and rhythm, S1/S2, without murmur, rub or gallop. There was no pedal edema. GI: abdomen was soft, flat, nontender, nondistended, without organomegaly. Muscoloskeletal: in brace. Skin exam showed no skin rash.  MS: minimal pain in lower back with movement.      Studies/Results: Results for orders placed during the hospital encounter of 03/26/11 (from the past 48 hour(s))  BASIC METABOLIC PANEL     Status: Abnormal   Collection Time   04/01/11  3:14 AM      Component Value Range Comment   Sodium 134 (*) 135 - 145 (mEq/L)    Potassium 3.2 (*) 3.5 - 5.1 (mEq/L)    Chloride 103  96 - 112 (mEq/L)    CO2 25  19 - 32 (mEq/L)    Glucose, Bld 113 (*) 70 - 99 (mg/dL)    BUN 25 (*) 6 - 23 (mg/dL)    Creatinine, Ser 5.28  0.50 - 1.35 (mg/dL)    Calcium 9.5  8.4 - 10.5 (mg/dL)    GFR calc non Af Amer >90  >90 (mL/min)    GFR calc Af Amer >90  >90 (mL/min)   CBC     Status: Abnormal   Collection Time   04/01/11  3:14 AM      Component Value Range Comment   WBC 2.5 (*) 4.0 - 10.5 (K/uL)    RBC 3.12 (*) 4.22 - 5.81 (MIL/uL)    Hemoglobin 8.2 (*) 13.0 - 17.0 (g/dL)  HCT 25.1 (*) 39.0 - 52.0 (%)    MCV 80.4  78.0 - 100.0 (fL)    MCH 26.3  26.0 - 34.0 (pg)    MCHC 32.7  30.0 - 36.0 (g/dL)    RDW 16.1 (*) 09.6 - 15.5 (%)    Platelets 40 (*) 150 - 400 (K/uL)   MAGNESIUM     Status: Normal   Collection Time   04/01/11  3:14 AM      Component Value Range Comment   Magnesium 1.8  1.5 - 2.5 (mg/dL)   PHOSPHORUS     Status: Normal   Collection Time   04/01/11  3:14 AM      Component Value Range Comment   Phosphorus 3.2  2.3 - 4.6 (mg/dL)   CBC     Status: Abnormal   Collection Time   04/02/11  3:10 AM      Component Value Range Comment   WBC 2.7 (*) 4.0 - 10.5 (K/uL)    RBC 3.05 (*) 4.22 - 5.81 (MIL/uL)    Hemoglobin 8.0 (*) 13.0 - 17.0 (g/dL)    HCT 04.5 (*) 40.9 - 52.0 (%)    MCV 81.3  78.0 - 100.0 (fL)    MCH 26.2  26.0 - 34.0 (pg)     MCHC 32.3  30.0 - 36.0 (g/dL)    RDW 81.1 (*) 91.4 - 15.5 (%)    Platelets 44 (*) 150 - 400 (K/uL)   BASIC METABOLIC PANEL     Status: Abnormal   Collection Time   04/02/11  3:10 AM      Component Value Range Comment   Sodium 134 (*) 135 - 145 (mEq/L)    Potassium 3.3 (*) 3.5 - 5.1 (mEq/L)    Chloride 104  96 - 112 (mEq/L)    CO2 24  19 - 32 (mEq/L)    Glucose, Bld 147 (*) 70 - 99 (mg/dL)    BUN 28 (*) 6 - 23 (mg/dL)    Creatinine, Ser 7.82  0.50 - 1.35 (mg/dL)    Calcium 8.9  8.4 - 10.5 (mg/dL)    GFR calc non Af Amer >90  >90 (mL/min)    GFR calc Af Amer >90  >90 (mL/min)   MAGNESIUM     Status: Normal   Collection Time   04/02/11  3:10 AM      Component Value Range Comment   Magnesium 2.1  1.5 - 2.5 (mg/dL)   PHOSPHORUS     Status: Normal   Collection Time   04/02/11  3:10 AM      Component Value Range Comment   Phosphorus 2.9  2.3 - 4.6 (mg/dL)    Dg Chest Port 1 View  04/01/2011  *RADIOLOGY REPORT*  Clinical Data: Pulmonary edema  PORTABLE CHEST - 1 VIEW  Comparison: 03/30/1928  Findings: Persistent moderate enlargement of the cardiomediastinal silhouette noted with improving fluffy perihilar airspace opacities.  Trace if any pleural fluid noted.  IMPRESSION: Improving perihilar airspace opacities compatible with improving edema.  Original Report Authenticated By: Harrel Lemon, M.D.     MEDICATIONS:  Reviewed.     Assessment/Plan:   1. IgG lambda multiple myeloma:  - he has had d1,4,8,and 11 of [name] (last dose 03/29/11). He started his 21 day of Revlimid chemo pill on 03/21/11. The remaining Revlimid from this cycle was discontinued on 03/30/11 due to sepsis.  He is on Acyclovir prophylaxis.  - Zometa will be delayed for about 2-3 wks to allow healing  from dental extraction.  - He will see [facility] BMT soon to discuss role of BMT.   2. Compression fracture: s/p kyphoplasty last week with now much better pain control. He is on Fentanyl patch 76mcg/hour change 72 hours for  long acting. For short acting, Percocet has been working for him the last 24 hours.  He also has Flexeril for muscle spasm.   3. Skin rash: Improving off of MS Contin.   4. Sepsis: Either pneumonia vs recent procedure. No more sepsis physiology. He has not required pressor or IVF bolus. He is still afebrile >48 hours.  Blood and urine cultures still negative.  I will stop emperic IV ntibiotics.  However, he still has relative suppressed immune system from recent chemo, I will start emperic Avelox 400mg  PO daily.  I will taper down Solu-Cortef with goal to d/c upon d/c if normal BP.   5. Anemia/thrombocytopenia: No evidence of active bleeding. Most likely due to myeloma and recent chemo. Transfuse prn Hgb <7 or plt <10K or active bleeding. Hold of of Lovenox prophylaxis until plt improved.   6. Hypokalemia: Due to chemo. I will start KCl 20 mEq PO BID.   7. Deconditioning due to therapy, acute illness.  I requested PT.   8.  Disposition:  If he does well today and tomorrow on regular floor, plan for D/C home on Wed 04/04/11.   FULL CODE.

## 2011-04-02 NOTE — Plan of Care (Signed)
Problem: Phase II Progression Outcomes Goal: Vital signs remain stable Outcome: Progressing BP remains soft throughout the night. Goal: IV changed to normal saline lock Outcome: Completed/Met Date Met:  04/02/11 Patient on liquid diet, only receives IV medications

## 2011-04-02 NOTE — Progress Notes (Signed)
Patient transferred to room  1313 by [facility]  Shane Woodard

## 2011-04-02 NOTE — Progress Notes (Signed)
Patient is alert and oriented. Denies pain or discomfort at this time. BP is soft but patient's BP has been running low. He is afebrile and resting at this time. Will continue to monitor patient.

## 2011-04-02 NOTE — Progress Notes (Signed)
eLink Physician-Brief Progress Note Patient Name: Shane Woodard DOB: 04-05-1960 MRN: [id number]  Date of Service  04/02/2011   HPI/Events of Note  Hypokalemia   eICU Interventions  Potassium replaced   Intervention Category Intermediate Interventions: Electrolyte abnormality - evaluation and management  DETERDING,ELIZABETH 04/02/2011, 6:41 AM

## 2011-04-03 LAB — TYPE AND SCREEN: Unit division: 0

## 2011-04-03 NOTE — Progress Notes (Signed)
[facility] INPATIENT PROGRESS NOTE  Name: Shane Woodard      MRN: [id number]    Location: 1313/1313-01  Date: 04/03/2011 Time:9:30 AM    DIAGNOSIS: IgG lambda multiple myeloma; presented with anemia, and bone pain. Initial M-spike was 5.1gm/dL; free serum lambda of 1.19 mg/dL; Ig G 7760 mg/dL; JYNW-2-[id number] of 2.47. Bone marrow biopsy showed 35% plasma cell; cytogenetics per FISH was positive for t(11;14).   CURRENT THERAPY: started on 03/19/11 [name] 1.3mg /m2 SQ d1, 4,8,11 q28 day; Revlimid 25 mg PO d1-21 q28day; Dexamethasone 40mg  PO weekly (even of week off of chemo). He was also on prophylactic Acyclovir 400mg  PO BID; Bactrim DS Mon/Wed/Fri; Lovenox 40mg  SQ daily. Zometa is due to start after he is cleared by dentist. His current Revlimid was discontinued on 03/30/11 due to sepsis.   Subjective: Interval History:Shane Woodard reported feeling much better.  His back pain is now minimal at rest.  He has been able to ambulate to the bathroom.  With slow ambulation, his pain is minimal but worsened with quick pace.  His skin rash is minimal as well.  He denies fever, headache, mucositis, SOB, abd pain, bleeding symptoms.  He had bowel movements yesterday.   Objective: Vital signs in last 24 hours: Temp:  [97.1 F (36.2 C)-97.8 F (36.6 C)] 97.6 F (36.4 C) (02/05 0531) Pulse Rate:  [64-72] 72  (02/05 0531) Resp:  [14-22] 18  (02/05 0531) BP: (97-109)/(61-72) 109/70 mmHg (02/05 0531) SpO2:  [93 %-95 %] 93 % (02/05 0531) Weight:  [156 lb 15.5 oz (71.2 kg)] 156 lb 15.5 oz (71.2 kg) (02/04 1540)     PHYSICAL EXAM:  General: well-nourished in pain. Eyes: no scleral icterus. ENT: packing post dental extraction without brisk bleeding. Neck was without thyromegaly. Lymphatics: Negative cervical, supraclavicular or axillary adenopathy. Respiratory: lungs were clear bilaterally without wheezing or crackles. Cardiovascular: Regular rate and rhythm, S1/S2, without murmur, rub or gallop. There  was no pedal edema. GI: abdomen was soft, flat, nontender, nondistended, without organomegaly. Muscoloskeletal: in brace. Skin exam showed no skin rash.  MS: minimal pain in lower back with movement.      Studies/Results: Results for orders placed during the hospital encounter of 03/26/11 (from the past 48 hour(s))  CBC     Status: Abnormal   Collection Time   04/02/11  3:10 AM      Component Value Range Comment   WBC 2.7 (*) 4.0 - 10.5 (K/uL)    RBC 3.05 (*) 4.22 - 5.81 (MIL/uL)    Hemoglobin 8.0 (*) 13.0 - 17.0 (g/dL)    HCT 69.6 (*) 29.5 - 52.0 (%)    MCV 81.3  78.0 - 100.0 (fL)    MCH 26.2  26.0 - 34.0 (pg)    MCHC 32.3  30.0 - 36.0 (g/dL)    RDW 28.4 (*) 13.2 - 15.5 (%)    Platelets 44 (*) 150 - 400 (K/uL)   BASIC METABOLIC PANEL     Status: Abnormal   Collection Time   04/02/11  3:10 AM      Component Value Range Comment   Sodium 134 (*) 135 - 145 (mEq/L)    Potassium 3.3 (*) 3.5 - 5.1 (mEq/L)    Chloride 104  96 - 112 (mEq/L)    CO2 24  19 - 32 (mEq/L)    Glucose, Bld 147 (*) 70 - 99 (mg/dL)    BUN 28 (*) 6 - 23 (mg/dL)    Creatinine, Ser 4.40  0.50 -  1.35 (mg/dL)    Calcium 8.9  8.4 - 10.5 (mg/dL)    GFR calc non Af Amer >90  >90 (mL/min)    GFR calc Af Amer >90  >90 (mL/min)   MAGNESIUM     Status: Normal   Collection Time   04/02/11  3:10 AM      Component Value Range Comment   Magnesium 2.1  1.5 - 2.5 (mg/dL)   PHOSPHORUS     Status: Normal   Collection Time   04/02/11  3:10 AM      Component Value Range Comment   Phosphorus 2.9  2.3 - 4.6 (mg/dL)    No results found.   MEDICATIONS:  Reviewed.     Assessment/Plan:   1. IgG lambda multiple myeloma:  - he has had d1,4,8,and 11 of [name] (last dose 03/29/11). He started his 21 day of Revlimid chemo pill on 03/21/11. The remaining Revlimid from this cycle was discontinued on 03/30/11 due to sepsis.  He is on Acyclovir prophylaxis.  - Zometa will be delayed for about 2-3 wks to allow healing from dental extraction.    - He will see [facility] BMT soon to discuss role of BMT.   2. Compression fracture: s/p kyphoplasty last week with now much better pain control. He is on Fentanyl patch 95mcg/hour change 72 hours for long acting. For short acting, Percocet has been working for him the last 24 hours.  He also has Flexeril for muscle spasm.   3. Skin rash: Improving off of MS Contin.   4. Sepsis: Either pneumonia vs recent procedure. No more sepsis physiology. He is on moxifloxacin past 24 hours with no fever.  His BP has normalized on tapered dose of Hydrocortef. Will d/c Hydrocortef today and observe his BP today.   5. Anemia/thrombocytopenia: No evidence of active bleeding. Most likely due to myeloma and recent chemo. Transfuse prn Hgb <7 or plt <10K or active bleeding. Hold of of Lovenox prophylaxis until plt improved.   6. Hypokalemia: Due to chemo. Continue with KCl 20 mEq PO BID.   7. Deconditioning due to therapy, acute illness.  I requested PT to see if he will need home PT.   8.  Disposition:  If he does well today; plan for D/C home on Wed 04/04/11.   FULL CODE.

## 2011-04-03 NOTE — Progress Notes (Signed)
Physical Therapy Treatment Patient Details Name: Shane Woodard MRN: [id number] DOB: August 09, 1960 Today's Date: 04/03/2011  kyphoplasty 03/27/11 24 teeth extraction & aveoloplasty 03/28/11 Multiple myoloma anemia 9:35 - 10:00 1 gt  1 ta  PT Assessment/Plan  PT - Assessment/Plan Comments on Treatment Session: Pt states his back pain is now mild.  HgB 8.0.  Mild c/o fatigue. PT Plan: Discharge plan remains appropriate Follow Up Recommendations: Home health PT;Skilled nursing facility;Other (comment) (pending pt progress) Equipment Recommended: Rolling walker with 5" wheels;3 in 1 bedside comode PT Goals  Acute Rehab PT Goals PT Goal Formulation: With patient Pt will go Supine/Side to Sit: with modified independence PT Goal: Supine/Side to Sit - Progress: Progressing toward goal Pt will go Sit to Supine/Side: with modified independence PT Goal: Sit to Supine/Side - Progress: Progressing toward goal Pt will Ambulate: 51 - 150 feet;with modified independence;with least restrictive assistive device PT Goal: Ambulate - Progress: Progressing toward goal  PT Treatment Mobility (including Balance) Bed Mobility Bed Mobility: Yes Supine to Sit: 4: Min assist Supine to Sit Details (indicate cue type and reason): 50% VC's on proper Log Roll tech to minimize pain Sit to Supine - Details (indicate cue type and reason): left pt sitting on EOB with staff member in room Transfers Transfers: Yes Sit to Stand: 3: Mod assist;From bed Sit to Stand Details (indicate cue type and reason): 50% VC's on hand placement and safety not to pull up on walker Stand to Sit: 3: Mod assist;To bed Stand to Sit Details: 50% VC's on turn completion using RW and hand placement prior to sit Ambulation/Gait Ambulation/Gait: Yes Ambulation/Gait Assistance: 3: Mod assist Ambulation/Gait Assistance Details (indicate cue type and reason): 50% VC's on proper walker to self distance and safety with turns while using RW Ambulation  Distance (Feet): 110 Feet Assistive device: Rolling walker Gait Pattern: Step-to pattern;Trunk flexed Stairs: No Wheelchair Mobility Wheelchair Mobility: No     End of Session PT - End of Session Equipment Utilized During Treatment: Gait belt Activity Tolerance: Patient tolerated treatment well Patient left: in bed;with call bell in reach Nurse Communication: Mobility status for ambulation General Behavior During Session: [facility] for tasks performed Cognition: [facility] for tasks performed  Felecia Shelling  PTA [facility]  Acute  Rehab Pager     [phone]

## 2011-04-03 NOTE — Progress Notes (Signed)
Nutrition Brief Note  - Met with pt who reports tolerating clear liquid diet, ready to advance to soups. MD ordered advance diet as tolerated, so diet advanced to full liquids. Discussed with RN. Pt requesting wheelchair at home and assistance with walking to bathroom - notified RN. Will monitor.   Dietitian # [phone]

## 2011-04-04 ENCOUNTER — Encounter: Payer: Self-pay | Admitting: Oncology

## 2011-04-04 ENCOUNTER — Other Ambulatory Visit: Payer: Self-pay | Admitting: Oncology

## 2011-04-04 LAB — BASIC METABOLIC PANEL
BUN: 27 mg/dL — ABNORMAL HIGH (ref 6–23)
Calcium: 8.9 mg/dL (ref 8.4–10.5)
Creatinine, Ser: 0.83 mg/dL (ref 0.50–1.35)
GFR calc non Af Amer: >90 mL/min (ref >90)
Glucose, Bld: 95 mg/dL (ref 70–99)

## 2011-04-04 LAB — CBC
HCT: 25.8 % — ABNORMAL LOW (ref 39.0–52.0)
Hemoglobin: 8.3 g/dL — ABNORMAL LOW (ref 13.0–17.0)
MCH: 26.3 pg (ref 26.0–34.0)
MCHC: 32.2 g/dL (ref 30.0–36.0)

## 2011-04-04 MED ORDER — POTASSIUM CHLORIDE CRYS ER 20 MEQ PO TBCR: 20.0000 meq | EXTENDED_RELEASE_TABLET | Freq: Two times a day (BID) | ORAL | Status: DC

## 2011-04-04 MED ORDER — CYCLOBENZAPRINE HCL 5 MG PO TABS: 5.0000 mg | ORAL_TABLET | Freq: Three times a day (TID) | ORAL | Status: AC

## 2011-04-04 MED ORDER — OXYCODONE-ACETAMINOPHEN 5-325 MG PO TABS: 1.0000 | ORAL_TABLET | Freq: Four times a day (QID) | ORAL | Status: AC | PRN

## 2011-04-04 MED ORDER — MOXIFLOXACIN HCL 400 MG PO TABS: 400.0000 mg | ORAL_TABLET | Freq: Every day | ORAL | Status: DC

## 2011-04-04 MED ORDER — FENTANYL 25 MCG/HR TD PT72: 1.0000 | MEDICATED_PATCH | TRANSDERMAL | Status: DC

## 2011-04-04 NOTE — Progress Notes (Signed)
CARE MANAGEMENT NOTE 04/04/2011  Patient:  Shane Woodard,Shane Woodard   Account Number:  [id number]  Date Initiated:  04/04/2011  Documentation initiated by:  Briel Gallicchio  Subjective/Objective Assessment:   51 yo pt admitted 03/26/11 with back pain, multiple myeloma     Action/Plan:   D/C when medically stable   Anticipated DC Date:  04/04/2011   Anticipated DC Plan:  HOME/SELF CARE          DC Planning Services  CM consult      PAC Choice  DURABLE MEDICAL EQUIPMENT     DME arranged  Levan Hurst      DME agency  [facility]        Status of service:  Completed, signed off   Discharge Disposition:  HOME/SELF CARE  Comments:  04/04/11, Kathi Der RNC-MNN, BSN, [phone], CM received rewferral for DME.  RW ordered for pt.  Lucretia at [facility] contacted for DME and confirmation of DME received.

## 2011-04-04 NOTE — Discharge Summary (Signed)
Physician Discharge Summary   Patient ID: Shane Woodard [id number] 51 y.o. Jan 09, 1961  Admit date: 03/26/2011  Discharge date and time: 04/04/2011  Admitting Physician: Pleas Koch, MD   Discharge Physician: Jethro Bolus, MD  Admission Diagnoses: Compression fracture of lumbar vertebra [805.4] BACK PAIN Multiple Myeloma/Chronic Periodontitis  Discharge Diagnoses: same, sepsis.   Admission Condition: fair  Discharged Condition: fair  Indication for Admission:  Compression fracture; intractable pain.   Hospital Course:   1. IgG lambda multiple myeloma:  - he has had d1,4,8,and 11 of [name] (last dose 03/29/11). He started his 21 day of Revlimid chemo pill on 03/21/11. The remaining Revlimid from this cycle was discontinued on 03/30/11 due to sepsis. He was on Acyclovir prophylaxis.  - Zometa will be delayed for about 2-3 wks to allow healing from dental extraction.  - He will see [facility] BMT soon to discuss role of BMT.   2.  Chronic periodontist:  As he anticipates needing Zometa for bony disease in myeloma, he underwent all teeth extraction by Dr. Kristin Bruins on 03/28/2011 without immediate complication.   3 Compression fracture: s/p kyphoplasty on 03/27/2011 with now much better pain control. He is on Fentanyl patch 32mcg/hour change 72 hours for long acting. For short acting, Percocet has been working for him the last 24 hours. He also has Flexeril for muscle spasm.   4. Skin rash: Improving off of MS Contin.   5. Sepsis:  He developed high grade fever, hypotension, tachycardia on on the night of 03/28/2011.  This was from either pneumonia vs recent procedure. He was transferred to step down and received IVF and stress dose steroid.  He had blood and urine culture which remained negative on discharge.  He was started on empiric antibitics with Vancomycin and Imipenem. After 4 days, his temperature remained afebrile for more than 48 hours with no more hypotensive episode or need for IVF bolus, he  was started on empiric oral moxifloxacin.  He was on this for 48 hours without fever or hypotension. His BP has normalized on tapered dose of Hydrocortef and was eventually discontinued they day prior to discharge.   6. Anemia/thrombocytopenia: No evidence of active bleeding. Most likely due to myeloma and recent chemo.   7. Hypokalemia: Due to chemo. He received one dose of KCl IV and was discharged on KCl 20 mEq PO BID.   8. Deconditioning due to therapy, acute illness. PT saw patient.  He was able to ambulate with a walker. PT recommended that patient may benefit from home PT.    Consults:  Dr. Ranee Gosselin from Interventional Radiology for kyphoplasty, Dr. Cindra Eves for dental extraction; Pulmonary Critical Care for sepsis;  [facility]; Nutritionist; and Physical Therapy.   Significant Diagnostic Studies: Kyphoplasty, PA/lateral CXR.   Treatments: chemotherapy [name] on 03/26/2011 and 03/29/2011.   Discharge Exam:  General: well-nourished in pain. Eyes: no scleral icterus. ENT: packing post dental extraction without brisk bleeding. Neck was without thyromegaly. Lymphatics: Negative cervical, supraclavicular or axillary adenopathy. Respiratory: lungs were clear bilaterally without wheezing or crackles. Cardiovascular: Regular rate and rhythm, S1/S2, without murmur, rub or gallop. There was no pedal edema. GI: abdomen was soft, flat, nontender, nondistended, without organomegaly. Muscoloskeletal: in brace. Skin exam showed no skin rash. MS: minimal pain in lower back with movement.   Disposition:   Patient Instructions:  Medication List  As of 04/04/2011  7:06 AM   STOP taking these medications         bortezomib IV 3.5 MG  injection      enoxaparin 40 MG/0.4ML Soln      lenalidomide 25 MG capsule      morphine 15 MG 12 hr tablet      sulfamethoxazole-trimethoprim 800-160 MG per tablet         TAKE these medications         acyclovir 400 MG tablet   Commonly  known as: ZOVIRAX   Take 1 tablet (400 mg total) by mouth daily.      cyclobenzaprine 5 MG tablet   Commonly known as: FLEXERIL   Take 1 tablet (5 mg total) by mouth 3 (three) times daily.      dexamethasone 4 MG tablet   Commonly known as: DECADRON   Take 40 mg by mouth every 7 (seven) days. Mondays.      fentaNYL 25 MCG/HR   Commonly known as: DURAGESIC - dosed mcg/hr   Place 1 patch (25 mcg total) onto the skin every 3 (three) days.      moxifloxacin 400 MG tablet   Commonly known as: AVELOX   Take 1 tablet (400 mg total) by mouth daily at 6 PM.      ondansetron 8 MG tablet   Commonly known as: ZOFRAN   Take 1 tablet two times a day starting the day after chemo for 2 days. Then take 1 tablet two times a day as needed for nausea or vomiting.      oxyCODONE-acetaminophen 5-325 MG per tablet   Commonly known as: PERCOCET   Take 1 tablet by mouth every 6 (six) hours as needed. For breakthrough pain.      potassium chloride SA 20 MEQ tablet   Commonly known as: K-DUR,KLOR-CON   Take 1 tablet (20 mEq total) by mouth 2 (two) times daily.      prochlorperazine 10 MG tablet   Commonly known as: COMPAZINE   Take 1 tablet (10 mg total) by mouth every 6 (six) hours as needed (Nausea or vomiting).           Further instruction for home medication: - sulfamethoxazole-trimethoprim:   Hold off on this while he is taking Moxifloxacin. - lenalidomide (chemo pill):  Hold off on this while he still has low blood count.  Dr. Gaylyn Rong will instruct him when to resume this. - enoxaparin (injection to prevent blood clot):  Hold off on this while he still has low blood count.  Dr. Gaylyn Rong will instruct him when to resume this.    Activity: activity as tolerated and no lifting,or strenuous exercise for at least one month or until instruction from MD.  Diet:  Mechanical soft.  Wound Care: not applicable.   Follow-up with Dr. Gaylyn Rong within one week.  Call [phone] for appointment.       SignedJethro Bolus 04/04/2011 7:06 AM

## 2011-04-04 NOTE — Progress Notes (Signed)
Patient discussed at the Long Length of Stay Shane Woodard Weeks 04/04/2011  

## 2011-04-05 ENCOUNTER — Telehealth: Payer: Self-pay | Admitting: Oncology

## 2011-04-05 ENCOUNTER — Encounter: Payer: Self-pay | Admitting: Oncology

## 2011-04-05 LAB — CULTURE, BLOOD (ROUTINE X 2)
Culture  Setup Time: 201302010335
Culture: NO GROWTH

## 2011-04-05 NOTE — Progress Notes (Signed)
Patient received five prescriptions from [place] [facility] pharmacy on 04/04/11 $53.85,his remaning balance [facility] $303.35.

## 2011-04-05 NOTE — Telephone Encounter (Signed)
called telephonic interpreting and had them to call pt with next appt d/t in [id number]

## 2011-04-06 ENCOUNTER — Encounter: Payer: Self-pay | Admitting: *Deleted

## 2011-04-06 ENCOUNTER — Ambulatory Visit
Admit: 2011-04-06 | Discharge: 2011-04-06 | Disposition: A | Discharge status: Home / Self Care | Payer: Medicaid Other | Attending: Radiation Oncology | Admitting: Radiation Oncology

## 2011-04-06 ENCOUNTER — Telehealth: Payer: Self-pay | Admitting: *Deleted

## 2011-04-06 ENCOUNTER — Ambulatory Visit (HOSPITAL_COMMUNITY)
Admission: RE | Admit: 2011-04-06 | Discharge: 2011-04-06 | Disposition: A | Discharge status: Home / Self Care | Payer: Self-pay | Source: Ambulatory Visit | Attending: Radiation Oncology | Admitting: Radiation Oncology

## 2011-04-06 ENCOUNTER — Encounter: Payer: Self-pay | Admitting: Radiation Oncology

## 2011-04-06 DIAGNOSIS — C9000 Multiple myeloma not having achieved remission: Secondary | ICD-10-CM | POA: Insufficient documentation

## 2011-04-06 DIAGNOSIS — R29898 Other symptoms and signs involving the musculoskeletal system: Secondary | ICD-10-CM | POA: Insufficient documentation

## 2011-04-06 DIAGNOSIS — R634 Abnormal weight loss: Secondary | ICD-10-CM | POA: Insufficient documentation

## 2011-04-06 DIAGNOSIS — M545 Low back pain, unspecified: Secondary | ICD-10-CM | POA: Insufficient documentation

## 2011-04-06 DIAGNOSIS — Z79899 Other long term (current) drug therapy: Secondary | ICD-10-CM | POA: Insufficient documentation

## 2011-04-06 DIAGNOSIS — R112 Nausea with vomiting, unspecified: Secondary | ICD-10-CM | POA: Insufficient documentation

## 2011-04-06 DIAGNOSIS — Z510 Encounter for antineoplastic radiation therapy: Secondary | ICD-10-CM | POA: Insufficient documentation

## 2011-04-06 DIAGNOSIS — X58XXXA Exposure to other specified factors, initial encounter: Secondary | ICD-10-CM | POA: Insufficient documentation

## 2011-04-06 DIAGNOSIS — S32009A Unspecified fracture of unspecified lumbar vertebra, initial encounter for closed fracture: Secondary | ICD-10-CM | POA: Insufficient documentation

## 2011-04-06 MED ORDER — FENTANYL 50 MCG/HR TD PT72: 1.0000 | MEDICATED_PATCH | TRANSDERMAL | Status: DC

## 2011-04-06 MED ORDER — MORPHINE SULFATE 4 MG/ML IJ SOLN
2.0000 mg | Freq: Once | INTRAMUSCULAR | Status: DC
  Filled 2011-04-06: qty 1

## 2011-04-06 NOTE — Progress Notes (Signed)
[facility] Psychosocial Distress Screening Clinical Social Work  Clinical Social Work was referred by distress screening protocol.  The patient scored a 10 on the Psychosocial Distress Thermometer which indicates severe distress. Clinical Social Worker attempted to meet with patient in exam room to assess for distress and other psychosocial needs. The patient currently in simulation treatment. CSW spoke with patient's interpreter and RN.  The patient designated that he is in extreme physical pain but exhibits no psychosocial/emotional distress.  CSW requested interpreter call after [facility] appointment if patient identifies any other needs.  CSW met with pt previously during last week's hospital admission and briefly assessed. CSW made referrals to financial counseling at [facility] to apply for Medicaid and [facility] to apply for disability.    Clinical Social Worker follow up needed: no  If yes, follow up plan: No needs at this time.  Kathrin Penner, MSW, [facility] Clinical Social Worker [facility] [phone]

## 2011-04-06 NOTE — Progress Notes (Signed)
Simulation / treatment planning note  The patient was taken to the CT simulator and laid in the supine position on the table. His legs are placed an alpha cradle. High-resolution CT axial imaging was obtained of the patient's thoracic or lumbar spine. I Place an isocenter in the lumbar spine. He tolerated the procedure well.  Treatment planning: I plan to treat from the top of T11 to the bottom of L5 with an AP PA arrangement using MLCs for custom blocks. I plan to treat to 20 Gray in 10 fractions.

## 2011-04-06 NOTE — Telephone Encounter (Signed)
Call from congregation nurse,  States pt w/ increased weakness and fatigue.  Requests wheelchair for pt to use at home.  Signed order by Dr. Gaylyn Rong faxed to [facility] fax [phone].

## 2011-04-06 NOTE — Progress Notes (Signed)
MORPHINE 2MG  IM PER DR. SQUIRE IN LEFT DELTOID FOR BACK PAIN RATED 9/10.  ADVISED RADIATION THERAPIST OF THIS AND TOLD THEM TO LET ME KNOW IF HE DID NOT GET RELIEF OR NEEDED MORE IN ORDER FOR THEM TO DO CT/SIM.

## 2011-04-06 NOTE — Progress Notes (Signed)
Please see the Nurse Progress Note in the MD Initial Consult Encounter for this patient. 

## 2011-04-06 NOTE — Progress Notes (Signed)
[facility] Radiation Oncology Follow up Note  Name: Shane Woodard   Date: 04/06/2011    MRN: [id number] DOB: 1960/10/24   DIAGNOSIS: Multiple myeloma    Narrative: The patient returns for followup. I previously met him as an inpatient. He underwent kyphoplasty from T12-L3. He had some pain alleviation from that. Yesterday however he developed acute onset severe pain in the region of L4. He is quite miserable. He is taking oxycodone /acetaminophen for breakthrough pain and also has a 25 mcg Duragesic patch on. He is with an interpreter and ready for simulation today.    ALLERGIES: Review of patient's allergies indicates no known allergies.   MEDICATIONS:  Current Outpatient Prescriptions  Medication Sig Dispense Refill  . acyclovir (ZOVIRAX) 400 MG tablet Take 1 tablet (400 mg total) by mouth daily.  30 tablet  3  . cyclobenzaprine (FLEXERIL) 5 MG tablet Take 1 tablet (5 mg total) by mouth 3 (three) times daily.  90 tablet  3  . dexamethasone (DECADRON) 4 MG tablet Take 40 mg by mouth every 7 (seven) days. Mondays.      . fentaNYL (DURAGESIC - DOSED MCG/HR) 50 MCG/HR Place 1 patch (50 mcg total) onto the skin every 3 (three) days.  5 patch  0  . moxifloxacin (AVELOX) 400 MG tablet Take 1 tablet (400 mg total) by mouth daily at 6 PM.  5 tablet  0  . ondansetron (ZOFRAN) 8 MG tablet Take 1 tablet two times a day starting the day after chemo for 2 days. Then take 1 tablet two times a day as needed for nausea or vomiting.  30 tablet  1  . oxyCODONE-acetaminophen (PERCOCET) 5-325 MG per tablet Take 1 tablet by mouth every 6 (six) hours as needed. For breakthrough pain.  100 tablet  0  . potassium chloride SA (K-DUR,KLOR-CON) 20 MEQ tablet Take 1 tablet (20 mEq total) by mouth 2 (two) times daily.  60 tablet  3  . prochlorperazine (COMPAZINE) 10 MG tablet Take 1 tablet (10 mg total) by mouth every 6 (six) hours as needed (Nausea or vomiting).  30 tablet  1   Current Facility-Administered  Medications  Medication Dose Route Frequency Provider Last Rate Last Dose  . morphine 4 MG/ML injection 2 mg  2 mg Intramuscular Once Buckner Malta, MD         PHYSICAL EXAM:   oral temperature is 97 F (36.1 C). His blood pressure is 92/61 and his pulse is 86. His respiration is 18.  he is in a wheelchair, and uncomfortable-appearing. He is edentulous, from recent tooth extraction He has tenderness to palpation particularly at the region of L4. It is worse with movement.   LABORATORY DATA:  Lab Results  Component Value Date   WBC 4.0 04/04/2011   HGB 8.3* 04/04/2011   HCT 25.8* 04/04/2011   MCV 81.9 04/04/2011   PLT 87* 04/04/2011   CMP     Component Value Date/Time   NA 137 04/04/2011 0346   K 3.8 04/04/2011 0346   CL 107 04/04/2011 0346   CO2 24 04/04/2011 0346   GLUCOSE 95 04/04/2011 0346   BUN 27* 04/04/2011 0346   CREATININE 0.83 04/04/2011 0346   CREATININE 1.03 03/13/2011 0812   CALCIUM 8.9 04/04/2011 0346   PROT 11.2* 03/08/2011 1105   ALBUMIN 2.9* 03/08/2011 1105   AST 13 03/08/2011 1105   ALT 9 03/08/2011 1105   ALKPHOS 44 03/08/2011 1105   BILITOT 0.2* 03/08/2011 1105  GFRNONAA >90 04/04/2011 0346   GFRAA >90 04/04/2011 0346        RADIOGRAPHIC STUDIES:  I ordered a lumbar spine x-ray today. It shows a small acute fracture at L4. I discussed this with Dr. Fatima Sanger. He believes it would be appropriate to perform kyphoplasty at that level. He informs me that this should not delay his radiotherapy.    IMPRESSION/PLAN: I simulated the patient today for treatment planning. I. reviewed the results from his lumbar spine x-ray with Dr. Algis Downs as above. The plan is to treat from the top of T11 to the bottom of L5 to 20 gray in 10 fractions. I will start his treatment in approximately a week and a half. He will probably undergo kyphoplasty next week.  It was a pleasure meeting with the patient today. We discussed the risks, benefits, and side effects of radiotherapy. No guarantees of  treatment were given. A consent form was signed and placed in the patient's medical record. The patient is enthusiastic about proceeding with treatment. I look forward to participating in the patient's care.   I gave him a prescription for 50 mcg Duragesic patches today due to severe pain. He'll use these instead of the 25 mcg patches for now.

## 2011-04-06 NOTE — Progress Notes (Signed)
BACK TO CHECK ON PT'S RESPONSE TO MORPHINE 2MG .  HE IS LYING ON TABLE QUIETLY AT PRESENT ALTHOUGH THERAPISTS SAID HE HAD A HARD TIME WHILE THEY WERE TRYING TO GET HIM SITUATED ON THE TABLE BUT SEEMS TO BE OK WHILE HE IS STILL.  WILL CHECK AGAIN AND I TOLD THEM TO CALL ME IF HE NEEDED MORE MS BEFORE I CAME BACK

## 2011-04-06 NOTE — Progress Notes (Signed)
DISCHARGED FROM [facility] 04/03/11 FOR INFECTION DUE TO TOTAL TEETH EXTRACTION, WAS IN ICU.  D/C'D ON AVELOX AND OXYCODONE APAP 5/325 FOR PAIN.  RATES CURRENT PAIN AT 9/10, TOOK PERCOCET THIS AM ABOUT 0800 AND STILL IN SEVERE PAIN.  CAREGIVER STATES HE WAS IN EXCRUCIATING PAIN TRYING TO GET HIM DOWN STEPS AND INTO CAR TO BRING HERE.  PATIENT FEELS THAT HE MAY HAVE ANOTHER FX IN THORACIC AREA WORST PAIN IN IN LOWER BACK AND RADIATES TO RIGHT SIDE

## 2011-04-09 ENCOUNTER — Telehealth: Payer: Self-pay | Admitting: *Deleted

## 2011-04-09 ENCOUNTER — Telehealth (HOSPITAL_COMMUNITY): Payer: Self-pay

## 2011-04-09 ENCOUNTER — Other Ambulatory Visit: Payer: Self-pay | Admitting: *Deleted

## 2011-04-09 ENCOUNTER — Other Ambulatory Visit: Payer: Self-pay | Admitting: Radiology

## 2011-04-09 ENCOUNTER — Telehealth: Payer: Self-pay | Admitting: Oncology

## 2011-04-09 ENCOUNTER — Encounter: Payer: Self-pay | Admitting: Oncology

## 2011-04-09 ENCOUNTER — Other Ambulatory Visit (HOSPITAL_COMMUNITY): Payer: Self-pay | Admitting: Interventional Radiology

## 2011-04-09 DIAGNOSIS — IMO0002 Reserved for concepts with insufficient information to code with codable children: Secondary | ICD-10-CM

## 2011-04-09 NOTE — Progress Notes (Signed)
Patient received one prescription from [facility] on 04/06/11,his remaning balance [facility] $286.55.

## 2011-04-09 NOTE — Telephone Encounter (Signed)
Verbal orders received from Dr. Gaylyn Rong, read back and routed to schedulers to r/s tomorrow's f/u to 04-11-11 or 04-16-11.  Victorino Dike notified and will schedule kyphoplasty for tomorrow morning.

## 2011-04-09 NOTE — Telephone Encounter (Signed)
Patient needs Kyphoplasty which Dr. Titus Dubin would like to do tomorrow to help patient's pain.  Would like to have tomorrow's f/u with Dr. Gaylyn Rong postponed.  Will notify providers.

## 2011-04-09 NOTE — Telephone Encounter (Signed)
called pts contact [facility] and informed them of new appt d/t for 02/13

## 2011-04-09 NOTE — Telephone Encounter (Signed)
Shane Woodard called to clarify pt's appts.  She verbalized understanding.

## 2011-04-10 ENCOUNTER — Ambulatory Visit (HOSPITAL_COMMUNITY)
Admission: RE | Admit: 2011-04-10 | Discharge: 2011-04-10 | Disposition: A | Discharge status: Home / Self Care | Payer: Self-pay | Source: Ambulatory Visit | Attending: Interventional Radiology | Admitting: Interventional Radiology

## 2011-04-10 ENCOUNTER — Other Ambulatory Visit: Payer: Self-pay | Admitting: Lab

## 2011-04-10 ENCOUNTER — Ambulatory Visit: Payer: Self-pay | Admitting: Oncology

## 2011-04-10 DIAGNOSIS — IMO0002 Reserved for concepts with insufficient information to code with codable children: Secondary | ICD-10-CM

## 2011-04-10 DIAGNOSIS — M8448XA Pathological fracture, other site, initial encounter for fracture: Secondary | ICD-10-CM | POA: Insufficient documentation

## 2011-04-10 DIAGNOSIS — C9000 Multiple myeloma not having achieved remission: Secondary | ICD-10-CM | POA: Insufficient documentation

## 2011-04-10 LAB — CBC
Hemoglobin: 10.1 g/dL — ABNORMAL LOW (ref 13.0–17.0)
MCH: 25.8 pg — ABNORMAL LOW (ref 26.0–34.0)
MCHC: 31.9 g/dL (ref 30.0–36.0)

## 2011-04-10 LAB — BASIC METABOLIC PANEL
BUN: 25 mg/dL — ABNORMAL HIGH (ref 6–23)
Calcium: 9.3 mg/dL (ref 8.4–10.5)
GFR calc non Af Amer: >90 mL/min (ref >90)
Glucose, Bld: 103 mg/dL — ABNORMAL HIGH (ref 70–99)
Potassium: 4.3 mEq/L (ref 3.5–5.1)

## 2011-04-10 MED ORDER — CEFAZOLIN SODIUM 1-5 GM-% IV SOLN
1.0000 g | INTRAVENOUS | Status: AC
  Administered 2011-04-10: 1 g via INTRAVENOUS

## 2011-04-10 MED ORDER — MIDAZOLAM HCL 5 MG/5ML IJ SOLN
INTRAMUSCULAR | Status: AC | PRN
  Administered 2011-04-10 (×3): 1 mg via INTRAVENOUS

## 2011-04-10 MED ORDER — DIPHENHYDRAMINE HCL 50 MG/ML IJ SOLN
INTRAMUSCULAR | Status: AC
  Filled 2011-04-10: qty 1

## 2011-04-10 MED ORDER — HYDROMORPHONE HCL PF 1 MG/ML IJ SOLN
INTRAMUSCULAR | Status: AC
  Filled 2011-04-10: qty 2

## 2011-04-10 MED ORDER — OXYCODONE-ACETAMINOPHEN 5-325 MG PO TABS
1.0000 | ORAL_TABLET | Freq: Four times a day (QID) | ORAL | Status: DC | PRN
  Administered 2011-04-10: 1 via ORAL
  Filled 2011-04-10: qty 1

## 2011-04-10 MED ORDER — SODIUM CHLORIDE 0.9 % IV SOLN
INTRAVENOUS | Status: AC | PRN
  Administered 2011-04-10: 75 mL/h via INTRAVENOUS

## 2011-04-10 MED ORDER — FENTANYL CITRATE 0.05 MG/ML IJ SOLN
INTRAMUSCULAR | Status: AC
  Filled 2011-04-10: qty 4

## 2011-04-10 MED ORDER — MIDAZOLAM HCL 2 MG/2ML IJ SOLN
INTRAMUSCULAR | Status: AC
  Filled 2011-04-10: qty 4

## 2011-04-10 MED ORDER — SODIUM CHLORIDE 0.9 % IV SOLN: INTRAVENOUS | Status: DC

## 2011-04-10 MED ORDER — TOBRAMYCIN SULFATE 1.2 G IJ SOLR
INTRAMUSCULAR | Status: AC
  Filled 2011-04-10: qty 1.2

## 2011-04-10 MED ORDER — FENTANYL CITRATE 0.05 MG/ML IJ SOLN
INTRAMUSCULAR | Status: AC | PRN
  Administered 2011-04-10 (×3): 25 ug via INTRAVENOUS

## 2011-04-10 MED ORDER — SODIUM CHLORIDE 0.9 % IV SOLN: INTRAVENOUS | Status: AC

## 2011-04-10 MED ORDER — HYDROMORPHONE HCL PF 1 MG/ML IJ SOLN
INTRAMUSCULAR | Status: AC | PRN
  Administered 2011-04-10: 1 mg

## 2011-04-10 NOTE — Procedures (Signed)
S/P L4 KP for painful  Pathological compression fracture. No acute complications

## 2011-04-10 NOTE — ED Notes (Signed)
Fluid bolus initiated for lower bp, MD aware

## 2011-04-10 NOTE — ED Notes (Signed)
250 cc fluid bolus complete.  114/83

## 2011-04-10 NOTE — Discharge Instructions (Signed)
KYPHOPLASTY/VERTEBROPLASTY DISCHARGE INSTRUCTIONS  Medications:      Resume all home medications as before procedure.                  Continue your pain medications as prescribed as needed.  Over the next 3-5 days, decrease your pain medication as tolerated.  Over the counter medications (i.e. Tylenol, ibuprofen, and aleve) may be substituted once severe/moderate pain symptoms have subsided.   Wound Care:   Bandages may be removed the day following your procedure.  You may get your incision wet once bandages are removed.  Bandaids may be used to cover the incisions until scab formation.  Topical ointments are optional.    If you develop a fever greater than 101 degrees, have increased skin redness at the incision sites or pus-like oozing from incisions occurring within 1 week of the procedure, contact radiology at [phone] or [phone].    Ice pack to back for 15-20 minutes 2-3 time per day for first 2-3 days post procedure.  The ice will expedite muscle healing and help with the pain from the incisions.   Activity:   Bedrest today with limited activity for 24 hours post procedure.    No driving for 48 hours.    Increase your activity as tolerated after bedrest (with assistance if necessary).    Refrain from any strenuous activity or heavy lifting (greater than 10 lbs.).   Follow up:   Contact radiology at [phone] or [phone] if any questions/concerns.    A physician assistant from radiology will contact you in approximately 1 week.    If a biopsy was performed at the time of your procedure, your referring physician should receive the results in usually 2-3 days.          1. Use walker for 2 weeks  2. No stooping ,or lifting more than 10 lbs for 2 weeks  RTC in 2 weeks .

## 2011-04-10 NOTE — H&P (Signed)
Shane Woodard is an 51 y.o. male.   Chief Complaint: Back pain; Hx L1; L2; L3; T12 Kyphoplasty 2 weeks ago due to painful fractures at these sites.  Pt has diagnosis of Multiple Myeloma HPI: New fracture at Lumbar #4 Scheduled for KP at this site today  Past Medical History  Diagnosis Date  . Anemia   . Arthritis   . Multiple myeloma     Past Surgical History  Procedure Date  . Left thumb surgery 11/26/2003  . Multiple extractions with alveoloplasty 03/28/2011    Procedure: MULTIPLE EXTRACION WITH ALVEOLOPLASTY;  Surgeon: Shane Woodard, DDS;  Location: [facility];  Service: Oral Surgery;  Laterality: N/A;  Extraction of tooth #'s 2,3,4,5,6,7,8,9,10,11,12,13,14,15,17, 20,21,22,23,24,25,26,27,28, 31, and 32 with alveoloplasty and mandibular left torus reduction.    No family history on file. Social History:  reports that he has been smoking Cigarettes.  He has a 15 pack-year smoking history. He has never used smokeless tobacco. He reports that he does not drink alcohol or use illicit drugs.  Allergies: No Known Allergies  Medications Prior to Admission  Medication Sig Dispense Refill  . acyclovir (ZOVIRAX) 400 MG tablet Take 1 tablet (400 mg total) by mouth daily.  30 tablet  3  . cyclobenzaprine (FLEXERIL) 5 MG tablet Take 1 tablet (5 mg total) by mouth 3 (three) times daily.  90 tablet  3  . dexamethasone (DECADRON) 4 MG tablet Take 40 mg by mouth every 7 (seven) days. Mondays.      . fentaNYL (DURAGESIC - DOSED MCG/HR) 50 MCG/HR Place 1 patch (50 mcg total) onto the skin every 3 (three) days.  5 patch  0  . moxifloxacin (AVELOX) 400 MG tablet Take 1 tablet (400 mg total) by mouth daily at 6 PM.  5 tablet  0  . ondansetron (ZOFRAN) 8 MG tablet Take 1 tablet two times a day starting the day after chemo for 2 days. Then take 1 tablet two times a day as needed for nausea or vomiting.  30 tablet  1  . oxyCODONE-acetaminophen (PERCOCET) 5-325 MG per tablet Take 1 tablet by mouth every 6 (six)  hours as needed. For breakthrough pain.  100 tablet  0  . potassium chloride SA (K-DUR,KLOR-CON) 20 MEQ tablet Take 1 tablet (20 mEq total) by mouth 2 (two) times daily.  60 tablet  3  . prochlorperazine (COMPAZINE) 10 MG tablet Take 1 tablet (10 mg total) by mouth every 6 (six) hours as needed (Nausea or vomiting).  30 tablet  1   Medications Prior to Admission  Medication Dose Route Frequency Provider Last Rate Last Dose  . 0.9 %  sodium chloride infusion   Intravenous Continuous D Jeananne Rama, PA      . ceFAZolin (ANCEF) IVPB 1 g/50 mL premix  1 g Intravenous On Call D Jeananne Rama, PA        No results found for this or any previous visit (from the past 48 hour(s)). No results found.  Review of Systems  Constitutional: Negative for fever.  Respiratory: Negative for cough.   Cardiovascular: Negative for chest pain.  Gastrointestinal: Negative for nausea, vomiting and abdominal pain.  Musculoskeletal: Positive for back pain.  Neurological: Negative for headaches.    Blood pressure 147/78, pulse 76, temperature 97 F (36.1 C), temperature source Oral, resp. rate 16, height 5\' 6"  (1.676 m), weight 165 lb (74.844 kg), SpO2 100.00%. Physical Exam  Constitutional: He is oriented to person, place, and time. He appears well-developed and  well-nourished.  HENT:  Head: Normocephalic.  Eyes: EOM are normal.  Neck: Normal range of motion.  Cardiovascular: Normal rate, regular rhythm and normal heart sounds.   No murmur heard. Respiratory: Effort normal and breath sounds normal. He has no wheezes.  GI: Soft. Bowel sounds are normal. There is no tenderness.  Musculoskeletal: Normal range of motion. He exhibits tenderness.       Pain in low back  Neurological: He is alert and oriented to person, place, and time.  Skin: Skin is warm and dry.     Assessment/Plan Previous T12; L1; L2; L3 Kyphoplasty 2 weeks ago. Scheduled for L4 KP. New fracture. Hx: MM Pt aware of procedure benefits  and risks and agreeable to proceed. Consent signed with interpreter.  Shane Woodard A 04/10/2011, 8:48 AM

## 2011-04-10 NOTE — ED Notes (Signed)
o2 started 2 l/[place]

## 2011-04-10 NOTE — ED Notes (Signed)
In IR, awaiting lab results.  Pt aware. Warm blankets applied.

## 2011-04-10 NOTE — Progress Notes (Signed)
Reviewed d/c instructions with pt via interpreter.  Interpreter stated pt understood all instructions and his ride would be here to pick him up at 1400 today.  The interpreter took his d/c instructions with her to his home care nurse.

## 2011-04-11 ENCOUNTER — Ambulatory Visit: Payer: Self-pay | Admitting: Nutrition

## 2011-04-11 ENCOUNTER — Ambulatory Visit (HOSPITAL_BASED_OUTPATIENT_CLINIC_OR_DEPARTMENT_OTHER): Payer: Medicaid Other | Admitting: Oncology

## 2011-04-11 ENCOUNTER — Telehealth: Payer: Self-pay | Admitting: Oncology

## 2011-04-11 ENCOUNTER — Telehealth (HOSPITAL_COMMUNITY): Payer: Self-pay | Admitting: *Deleted

## 2011-04-11 ENCOUNTER — Other Ambulatory Visit (HOSPITAL_BASED_OUTPATIENT_CLINIC_OR_DEPARTMENT_OTHER): Payer: Self-pay | Admitting: Lab

## 2011-04-11 DIAGNOSIS — C9000 Multiple myeloma not having achieved remission: Secondary | ICD-10-CM

## 2011-04-11 DIAGNOSIS — D649 Anemia, unspecified: Secondary | ICD-10-CM

## 2011-04-11 LAB — CBC WITH DIFFERENTIAL/PLATELET
Basophils Absolute: 0 10*3/uL (ref 0.0–0.1)
Eosinophils Absolute: 0 10*3/uL (ref 0.0–0.5)
HCT: 29 % — ABNORMAL LOW (ref 38.4–49.9)
LYMPH%: 33.5 % (ref 14.0–49.0)
MCV: 81.9 fL (ref 79.3–98.0)
MONO#: 0.1 10*3/uL (ref 0.1–0.9)
MONO%: 3.9 % (ref 0.0–14.0)
NEUT#: 1.9 10*3/uL (ref 1.5–6.5)
NEUT%: 60.7 % (ref 39.0–75.0)
Platelets: 228 10*3/uL (ref 140–400)
RBC: 3.54 10*6/uL — ABNORMAL LOW (ref 4.20–5.82)

## 2011-04-11 LAB — COMPREHENSIVE METABOLIC PANEL
Alkaline Phosphatase: 178 U/L — ABNORMAL HIGH (ref 39–117)
BUN: 21 mg/dL (ref 6–23)
CO2: 22 mEq/L (ref 19–32)
Creatinine, Ser: 1 mg/dL (ref 0.50–1.35)
Glucose, Bld: 87 mg/dL (ref 70–99)
Sodium: 134 mEq/L — ABNORMAL LOW (ref 135–145)
Total Bilirubin: 0.4 mg/dL (ref 0.3–1.2)

## 2011-04-11 NOTE — Assessment & Plan Note (Signed)
This is a 51 year old male patient of Dr. Gaylyn Rong diagnosed with multiple myeloma.  MEDICAL HISTORY INCLUDES:  Anemia and arthritis.  MEDICATIONS INCLUDE:  Decadron, Zofran, Compazine, and K-Dur.  LABS:  Sodium of 132, glucose of 103, BUN of 25.  HEIGHT:  66 inches. WEIGHT:  148.2 pounds.   Weight of 166.8 pounds documented on January 10th  BMI is 23.9.    Per physician, the patient is status post total tooth extraction.  He is unable to eat solid foods and he has a poor appetite.  Physician is hoping for nutrition samples for the patient along with some information that could be shared with him via his interpreter.  NUTRITION DIAGNOSIS:  Unintended weight loss related to difficulty chewing secondary to total teeth extractions as evidenced by an 18.6 pound weight loss in the last month.   INTERVENTION:  I have provided samples and nutritional information for the patient to take with them today.  I have also given them some coupons that they can use for any of the samples that he enjoys.  I have encouraged blenderizing of foods as needed and I will search for [place] handouts that I can mail to the patient.  MONITORING/EVALUATION (GOALS):  The patient will increase oral intake and minimize further weight loss.  NEXT VISIT:  No followup is scheduled.  The patient has contact information for questions or concerns.    ______________________________ Zenovia Jarred, RD, LDN Clinical Nutrition Specialist BN/MEDQ  D:  04/11/2011  T:  04/11/2011  Job:  [id number]

## 2011-04-11 NOTE — Progress Notes (Signed)
[facility] OFFICE PROGRESS NOTE  Cc:  [name], MD, MD  DIAGNOSIS: IgG lambda multiple myeloma; presented with anemia, lytic bone lesions. Initial M-spike was 5.1gm/dL; free serum lambda of 1.61 mg/dL; Ig G 7760 mg/dL; [id number]-4-[id number] of 2.47. Bone marrow biopsy showed 35% plasma cell; cytogenetics per FISH was positive for t(11;14).   CURRENT THERAPY: due to start on 03/19/11 [name] 1.3mg /m2 SQ d1, 4,8,11 q28 day; Revlimid 25 mg PO d1-21 q28day; Dexamethasone 40mg  PO weekly (even of week off of chemo).  He is also on Acyclovir 400mg  PO BID; Bactrim DS Mon/Wed/Fri; Lovenox 40mg  SQ daily.  Zometa is due to start after he is cleared by dentist.    INTERVAL HISTORY: Shane Woodard 51 y.o. male returns for hospital follow up with his congregation nurse and a Systems developer.  He was admitted to the hospital for compression fracture in Jan 2013.  He underwent kyphoplasty x2; the last one was yesterday.  His first cycle of chemo, he only had about 8 days of Revelimid and was cut short due to sepsis.    Since discharge, his pain has significantly improved in the back.  He now has some pain in the paraspinal area. He is able to ambulate still only short distance since long distant or sudden movement of his back will result in pain.  He takes Fentanyl patch and Percocet but not more than twice daily for break through pain.  He is due to start radiation therapy soon. He has had constipation and has not had a bowel movement for more than a week.  He has had low appetite due to the back pain.  Due to recent dental extraction and not yet denture, he mainly eats soup and drinks milk.  He has moderate fatigue.  He spends most of his awake time in sitting position at rest.   Patient denies  headache, visual changes, confusion, drenching night sweats, palpable lymph node swelling, mucositis, odynophagia, dysphagia, nausea vomiting, jaundice, chest pain, palpitation, shortness of breath, dyspnea  on exertion, productive cough, gum bleeding, epistaxis, hematemesis, hemoptysis, abdominal pain, abdominal swelling, early satiety, melena, hematochezia, hematuria, skin rash, spontaneous bleeding, joint swelling, joint pain, heat or cold intolerance, bowel bladder incontinence, back pain, focal motor weakness, paresthesia, depression, suicidal or homocidal ideation, feeling hopelessness.   MEDICAL HISTORY: Past Medical History  Diagnosis Date  . Anemia   . Arthritis   . Multiple myeloma     SURGICAL HISTORY:  Past Surgical History  Procedure Date  . Left thumb surgery 11/26/2003  . Multiple extractions with alveoloplasty 03/28/2011    Procedure: MULTIPLE EXTRACION WITH ALVEOLOPLASTY;  Surgeon: Shane Woodard, DDS;  Location: [facility]S;  Service: Oral Surgery;  Laterality: N/A;  Extraction of tooth #'s 2,3,4,5,6,7,8,9,10,11,12,13,14,15,17, 20,21,22,23,24,25,26,27,28, 31, and 32 with alveoloplasty and mandibular left torus reduction.    MEDICATIONS: Current Outpatient Prescriptions  Medication Sig Dispense Refill  . acyclovir (ZOVIRAX) 400 MG tablet Take 1 tablet (400 mg total) by mouth daily.  30 tablet  3  . cyclobenzaprine (FLEXERIL) 5 MG tablet Take 1 tablet (5 mg total) by mouth 3 (three) times daily.  90 tablet  3  . dexamethasone (DECADRON) 4 MG tablet Take 40 mg by mouth every 7 (seven) days. Mondays.      Marland Kitchen enoxaparin (LOVENOX) 40 MG/0.4ML SOLN Inject 40 mg into the skin daily.      . fentaNYL (DURAGESIC - DOSED MCG/HR) 50 MCG/HR Place 1 patch (50 mcg total) onto the skin every 3 (three) days.  5 patch  0  . ondansetron (ZOFRAN) 8 MG tablet Take 1 tablet two times a day starting the day after chemo for 2 days. Then take 1 tablet two times a day as needed for nausea or vomiting.  30 tablet  1  . oxyCODONE-acetaminophen (PERCOCET) 5-325 MG per tablet Take 1 tablet by mouth every 6 (six) hours as needed. For breakthrough pain.  100 tablet  0  . potassium chloride SA (K-DUR,KLOR-CON)  20 MEQ tablet Take 1 tablet (20 mEq total) by mouth 2 (two) times daily.  60 tablet  3  . prochlorperazine (COMPAZINE) 10 MG tablet Take 1 tablet (10 mg total) by mouth every 6 (six) hours as needed (Nausea or vomiting).  30 tablet  1   No current facility-administered medications for this visit.   Facility-Administered Medications Ordered in Other Visits  Medication Dose Route Frequency Provider Last Rate Last Dose  . DISCONTD: oxyCODONE-acetaminophen (PERCOCET) 5-325 MG per tablet 1 tablet  1 tablet Oral Q6H PRN Oneal Grout, MD   1 tablet at 04/10/11 1202    ALLERGIES:   has no known allergies.  REVIEW OF SYSTEMS:  The rest of the 14-point review of system was negative.   Filed Vitals:   04/11/11 1242  BP: 105/67  Pulse: 82  Temp: 96.8 F (36 C)   Wt Readings from Last 3 Encounters:  04/11/11 148 lb 3.2 oz (67.223 kg)  04/10/11 165 lb (74.844 kg)  04/02/11 156 lb 15.5 oz (71.2 kg)   ECOG Performance status: 1-2  PHYSICAL EXAMINATION:   General: thin-appearing man; no acute distress unless he tries to move. Eyes: no scleral icterus. ENT: edentulous without gum bleeding. Neck was without thyromegaly. Lymphatics: Negative cervical, supraclavicular or axillary adenopathy. Respiratory: lungs were clear bilaterally without wheezing or crackles. Cardiovascular: Regular rate and rhythm, S1/S2, without murmur, rub or gallop. There was no pedal edema. GI: abdomen was soft, flat, nontender, nondistended, without organomegaly. Muscoloskeletal: in brace with mild tender to palpation of paraspinal pain on palpation of lower back . Skin exam showed no skin rash.  He needed some help to get on and off exam table due to his back pain.   LABS:Cr 1; Ca 8.4. WBC 3.1; Hgb 9.2; Plt 228.    ASSESSMENT AND PLAN:  1. IgG lambda multiple myeloma:  - he has had d1,4,8,and 11 of [name] (last dose 03/29/11). He started his 21 day of Revlimid chemo pill on 03/21/11. The remaining Revlimid from this  cycle was discontinued on 03/30/11 due to sepsis.  I will delay the 2nd cycle of chemo until 05/07/2011 due to palliative radiation with Dr. Basilio Woodard between 04/26/2011 to 04/27/2011.  Despite being off of chemo the next few weeks, I advised him to continue Dexamethasone 40mg  PO once a week (on Mondays) to continue to have some treatment for his diease and also to help out pain control.  Due to sepsis after the first cycle and poor toleration, I will decrease his [name] by 10%, and Revlimid dose from 25mg  to 15mg  with the 2nd cycle.   -He is on Acyclovir prophylaxis.  Since his counts have improved, I advised him to resume Bactrim prophylaxis.  I advised him to also resume Lovenox to decrease risk of thrombosis on this chemo regimen.    - I will start Zometa when he resumes chemo on 05/07/2011.   - He will see [facility] BMT on 04/23/2011 to discuss role of BMT after induction chemo.   2. Compression fracture: s/p  kyphoplasty x2 with now much better pain control. He is on Fentanyl patch 82mcg/hour change 72 hours for long acting. For short acting, Percocet has been working for him. He also has Flexeril for muscle spasm.  He will also receive palliative radiation soon.   3. Constipation:  I advised him on Colace or Senna twice daily.  I advised him to go ahead with a laxative since he has not had a bowel movement for more than a week.   4. Sepsis: complete recovery at this time.  5. Anemia/thrombocytopenia:  plt has recovered.  He is still slightly anemic and leukopenic.  These will also during the few weeks off of chemo.  There is no active bleeding; there is no indication for transfusion.   6. Hypokalemia: stable on KCl 20 mEq PO BID.   7. Deconditioning due to therapy, acute illness, and pain.  I will place outpatient PT/OT.   8. Moderate calorie/protein malnutrition:  Due to back pain, and lack of teeth.  Hopefully, with pain control with meds and radiation, these will improve.  In the future, when his  gum has healed further, I will refer him back to Dr. Kristin Woodard for denture.  I advised him on Boosts.  He is seeing Shane Woodard from Nutrition today.  If he continues to have weight loss and anorexia next visit, I may consider Marinol.  9.  Follow up:  Radiation with Dr. Basilio Woodard over the next few weeks.  I will see him on 05/01/11 before resuming 2nd cycle of chemo on 05/07/2011.   The length of time of the face-to-face encounter was 25 minutes. More than 50% of time was spent counseling and coordination of care.

## 2011-04-11 NOTE — Telephone Encounter (Signed)
appts made and printed for pt aom °

## 2011-04-11 NOTE — Telephone Encounter (Signed)
Post procedure follow up call.  No answer, no available voice mail.

## 2011-04-12 ENCOUNTER — Encounter: Payer: Self-pay | Admitting: *Deleted

## 2011-04-12 ENCOUNTER — Telehealth: Payer: Self-pay | Admitting: *Deleted

## 2011-04-12 ENCOUNTER — Other Ambulatory Visit: Payer: Self-pay | Admitting: *Deleted

## 2011-04-12 ENCOUNTER — Telehealth (HOSPITAL_COMMUNITY): Payer: Self-pay | Admitting: Dental General Practice

## 2011-04-12 MED ORDER — SULFAMETHOXAZOLE-TRIMETHOPRIM 800-160 MG PO TABS: 1.0000 | ORAL_TABLET | ORAL | Status: DC

## 2011-04-12 NOTE — Progress Notes (Signed)
Faxed order for PT/OT to [facility] [phone]) per Dr. Lodema Pilot order.

## 2011-04-12 NOTE — Telephone Encounter (Signed)
Call from [employer] nurse to clarify pt's home meds.  Faxed her medication list to [phone].

## 2011-04-12 NOTE — Telephone Encounter (Signed)
THIS REQUEST WAS PLACED IN DR.HA'S BLUE FOLDER.  

## 2011-04-13 ENCOUNTER — Ambulatory Visit
Admission: RE | Admit: 2011-04-13 | Discharge: 2011-04-13 | Disposition: A | Discharge status: Home / Self Care | Payer: Medicaid Other | Source: Ambulatory Visit | Attending: Radiation Oncology | Admitting: Radiation Oncology

## 2011-04-13 ENCOUNTER — Encounter: Payer: Self-pay | Admitting: Radiation Oncology

## 2011-04-13 NOTE — Progress Notes (Signed)
Patient presents to the clinic today accompanied by his father requesting to be seen by a physician because of pain. Patient is alert and oriented to person, place, and time. No distress noted. Steady gait noted. Pleasant affect noted. Patient reports pain and right lower leg pain 6 on a scale of 0-10 despite taking Percocet at 1300. Patient's father reports that this severe pain has come on in just the last hour. Patient reports "my back feels like it did before when I broke my vertebrae." Patient reports that he is cold. Patient visibly shaking. Patient reports right lower leg pain upon ambulation. Patient refused to weight for fear it would increase pain. Reported all findings to Dr. Mitzi Hansen.

## 2011-04-14 ENCOUNTER — Encounter: Payer: Self-pay | Admitting: Radiation Oncology

## 2011-04-14 NOTE — Progress Notes (Signed)
Simulation Verification Note:  The patient underwent simulation verification for treatment to the spine on 04/13/11. The isocenter was in good position and the MLCs contoured the treatment volume appropriately.

## 2011-04-16 ENCOUNTER — Other Ambulatory Visit: Payer: Self-pay | Admitting: Radiation Oncology

## 2011-04-16 ENCOUNTER — Ambulatory Visit (HOSPITAL_COMMUNITY): Payer: Self-pay | Admitting: Dentistry

## 2011-04-16 ENCOUNTER — Encounter: Payer: Self-pay | Admitting: Radiation Oncology

## 2011-04-16 ENCOUNTER — Encounter: Payer: Self-pay | Admitting: *Deleted

## 2011-04-16 ENCOUNTER — Ambulatory Visit (HOSPITAL_COMMUNITY)
Admission: RE | Admit: 2011-04-16 | Discharge: 2011-04-16 | Disposition: A | Discharge status: Home / Self Care | Payer: Medicaid Other | Source: Ambulatory Visit | Attending: Radiation Oncology | Admitting: Radiation Oncology

## 2011-04-16 ENCOUNTER — Ambulatory Visit: Payer: No Typology Code available for payment source

## 2011-04-16 ENCOUNTER — Ambulatory Visit
Admission: RE | Admit: 2011-04-16 | Discharge: 2011-04-16 | Disposition: A | Discharge status: Home / Self Care | Payer: Medicaid Other | Source: Ambulatory Visit | Attending: Radiation Oncology | Admitting: Radiation Oncology

## 2011-04-16 VITALS — BP 111/71 | HR 85 | Temp 97.1°F

## 2011-04-16 DIAGNOSIS — C9000 Multiple myeloma not having achieved remission: Secondary | ICD-10-CM

## 2011-04-16 DIAGNOSIS — R29898 Other symptoms and signs involving the musculoskeletal system: Secondary | ICD-10-CM | POA: Insufficient documentation

## 2011-04-16 DIAGNOSIS — M25519 Pain in unspecified shoulder: Secondary | ICD-10-CM | POA: Insufficient documentation

## 2011-04-16 DIAGNOSIS — M47817 Spondylosis without myelopathy or radiculopathy, lumbosacral region: Secondary | ICD-10-CM | POA: Insufficient documentation

## 2011-04-16 DIAGNOSIS — K08109 Complete loss of teeth, unspecified cause, unspecified class: Secondary | ICD-10-CM

## 2011-04-16 DIAGNOSIS — M948X9 Other specified disorders of cartilage, unspecified sites: Secondary | ICD-10-CM | POA: Insufficient documentation

## 2011-04-16 DIAGNOSIS — K08199 Complete loss of teeth due to other specified cause, unspecified class: Secondary | ICD-10-CM

## 2011-04-16 NOTE — Progress Notes (Signed)
Monday, April 16, 2011   BP: 111/71             P: 85            T: 97.1   Shane Woodard presents for periodic oral examination and evaluation of healing after extraction of teeth on 03/28/11.  Subjective: Patient denies any significant discomfort in his mouth. Patient denies having any sutures in his mouth.   Patient is interested in proceeding with denture fabrication. Patient has applied for Medicaid but has not received Medicaid to date.    Exam: Patient is edentulous. One set of sutures remains in the lower left quadrant. Generalized primary closure is noted. Some sharp protrusions of bone remain but no further alveoloplasty will be required at this time.  Sutures from lower left were removed with complications.  Assessment: Post op course is consistent with extractions with alveoloplasty and pre-prosthetic surgery in the OR.   Plan/Recommendations: 1. Continue salt water rinses every 2 hours while awake. 2. Allow more oral healing to occur before start of upper and lower complete denture fabrication. 3. Allow more oral healing before start of Zometa, although may be started at discretion of Dr. Gaylyn Rong if clinically needed.   4. Let Dental Medicine know if patient gets Medicaid so that prior approval may be obtained for the dentures. Quote for dentures without Medicaid coverage was provided.  RTC for start of dentures in Mid- March as scheduled. Call if problems before then.   Dr. Cindra Eves

## 2011-04-16 NOTE — Progress Notes (Signed)
In the process of completing Post-simulation Education for Shane Woodard's treatment to his T-11 - L5 region, he reported new weakness, since the end of last week, in his left upper extremity with difficulty "raising his arm" and picking up objects.  He denied any pain in the C-spine region .He was assessed by Dr. Mervin Kung sent to [facility] for x-rays of the C-spine and Left shoulder.  He was accompanied by an interpreter and escorted to Radiology by Ina Kick, Tech II.

## 2011-04-16 NOTE — Progress Notes (Signed)
Weekly Management Note:  Site:T11-L5 spine Current Dose:  200  cGy Projected Dose: 2000  cGy  Narrative: The patient is seen today for routine under treatment assessment. CBCT/MVCT images/port films were reviewed. The chart was reviewed.   He is seen today for a 2-3 day history of left distal upper extremity/hand weakness. He denies neck pain.He denies loss of sensation. He also reports left shoulder pain. He is seen today with his interpreter. He had a bone survey on January 16 which did show subtle lucent foci along C2, C3, C4, and C5 vertebral bodies.  Physical Examination: There were no vitals filed for this visit..  Weight:  . There is palpable discomfort along his left anterior glenoid/proximal humerus. There is no cervical spine discomfort. Neurologic examination remarkable for slight weakness of his left grip. There appears to be slight weakness of the although flexion (biceps) and slight weakness of left elbow extension (triceps). However, I'm not sure if this is secondary to his left shoulder pain. No obvious sensory deficit.  Impression: Tolerating radiation therapy well. I wonder if he has symptomatic involvement of his cervical spine. I'm also concerned about his palpable discomfort along his left anterior glenoid. Plan: Continue radiation therapy as planned. I requesting plain films of his left shoulder and also cervical spine. He may need a MRI scan of his cervical spine.

## 2011-04-17 ENCOUNTER — Telehealth: Payer: Self-pay | Admitting: *Deleted

## 2011-04-17 ENCOUNTER — Ambulatory Visit
Admission: RE | Admit: 2011-04-17 | Discharge: 2011-04-17 | Disposition: A | Discharge status: Home / Self Care | Payer: Medicaid Other | Source: Ambulatory Visit | Attending: Radiation Oncology | Admitting: Radiation Oncology

## 2011-04-17 NOTE — Progress Notes (Signed)
Clinic note:  The patient is seen today with his interpreter and mother-in-law. Again, he does have weakness of his left upper extremity and also pain radiating from his left shoulder down to his wrist. There is weakness of left hand grip strength, and also slight biceps and triceps weakness. There is no discrete sensory loss to light touch. Her, there is pain described from his left shoulder down to his hand. His plain x-rays of the cervical spine suggest myelomatous involvement of C2, C3, C4, and also C5. Plain films of the left shoulder were without evidence for myeloma.  Plan: I'll go ahead and order a MRI scan of the cervical spine.

## 2011-04-17 NOTE — Progress Notes (Signed)
Interpreter and mother-in-law w/pt today. Returns to see Dr Dayton Scrape.  Through interpreter, pt states no changes in weakness since yesterday, no further weaknesses, no pain.

## 2011-04-18 ENCOUNTER — Ambulatory Visit
Admission: RE | Admit: 2011-04-18 | Discharge: 2011-04-18 | Disposition: A | Discharge status: Home / Self Care | Payer: Medicaid Other | Source: Ambulatory Visit | Attending: Radiation Oncology | Admitting: Radiation Oncology

## 2011-04-18 ENCOUNTER — Encounter: Payer: Self-pay | Admitting: *Deleted

## 2011-04-18 ENCOUNTER — Encounter: Payer: Self-pay | Admitting: Radiation Oncology

## 2011-04-18 NOTE — Progress Notes (Signed)
Bilateral Rib pain today.  Grades pain as a level 4-5 on a scale of 0-10.  Duragesic 50 mcg presently..  Pt states he has a short acting pain medication, but does not know what it is.  Requested he bring pain med. Tomorrow.  Denies any constipation presently, but has stool softener.  BP lower today, but when taken while standing it was stable.  He denies any dizziness.  Ambulating with walker vs. Wheelchair on Monday.  Gait faster today.

## 2011-04-18 NOTE — Progress Notes (Signed)
Dr. Gaylyn Rong is holding on to this refill until next f/u appt. On 05-01-11.  Called [facility] to notify them of them of the timing of this refill decision.

## 2011-04-18 NOTE — Progress Notes (Signed)
[facility] faxed Revlimid refill request.  This request to MD for review. 

## 2011-04-18 NOTE — Progress Notes (Signed)
   Weekly Management Note Current Dose:  600 cGy  Projected Dose:  2000  cGy   Narrative:  The patient presents for routine under treatment assessment.  CBCT/MVCT images/Port film x-rays were reviewed.  The chart was checked. He is doing relatively well. He reports that his back pain has improved with kyphoplasty. He denies any nausea or diarrhea. He does have some recurrent pain in his lower left anterior rib cage. He has some soreness in his neck as well as his left shoulder. He also continues to have pain in his left upper extremity as reported by Dr. Dayton Scrape. The MRI of his cervical spine is on February 22.  Physical Findings: Weight: 146 lb (66.225 kg).   CBC    Component Value Date/Time   WBC 3.1* 04/11/2011 1227   WBC 4.4 04/10/2011 0837   RBC 3.54* 04/11/2011 1227   RBC 3.92* 04/10/2011 0837   HGB 9.2* 04/11/2011 1227   HGB 10.1* 04/10/2011 0837   HCT 29.0* 04/11/2011 1227   HCT 31.7* 04/10/2011 0837   PLT 228 04/11/2011 1227   PLT 220 04/10/2011 0837   MCV 81.9 04/11/2011 1227   MCV 80.9 04/10/2011 0837   MCH 26.0* 04/11/2011 1227   MCH 25.8* 04/10/2011 0837   MCHC 31.7* 04/11/2011 1227   MCHC 31.9 04/10/2011 0837   RDW 17.3* 04/11/2011 1227   RDW 16.9* 04/10/2011 0837   LYMPHSABS 1.0 04/11/2011 1227   LYMPHSABS 0.4* 03/30/2011 0130   MONOABS 0.1 04/11/2011 1227   MONOABS 0.1 03/30/2011 0130   EOSABS 0.0 04/11/2011 1227   EOSABS 0.0 03/30/2011 0130   BASOSABS 0.0 04/11/2011 1227   BASOSABS 0.0 03/30/2011 0130    CMP     Component Value Date/Time   NA 134* 04/11/2011 1227   K 4.4 04/11/2011 1227   CL 106 04/11/2011 1227   CO2 22 04/11/2011 1227   GLUCOSE 87 04/11/2011 1227   BUN 21 04/11/2011 1227   CREATININE 1.00 04/11/2011 1227   CREATININE 1.03 03/13/2011 0812   CALCIUM 8.4 04/11/2011 1227   PROT 8.5* 04/11/2011 1227   ALBUMIN 2.8* 04/11/2011 1227   AST 18 04/11/2011 1227   ALT 24 04/11/2011 1227   ALKPHOS 178* 04/11/2011 1227   BILITOT 0.4 04/11/2011 1227   GFRNONAA >90 04/10/2011 0837   GFRAA >90 04/10/2011 0837     Impression:  The patient is tolerating radiotherapy.  Plan:  Continue radiotherapy as planned. I will review his MRI with him on Monday, February 25. He may need radiation planned to his cervical spine and or left shoulder and or left rib cage. We will discuss this once we have complete information. He has fairly diffuse disease, unfortunately.

## 2011-04-19 ENCOUNTER — Ambulatory Visit: Payer: No Typology Code available for payment source

## 2011-04-19 ENCOUNTER — Ambulatory Visit
Admission: RE | Admit: 2011-04-19 | Discharge: 2011-04-19 | Disposition: A | Discharge status: Home / Self Care | Payer: Medicaid Other | Source: Ambulatory Visit | Attending: Radiation Oncology | Admitting: Radiation Oncology

## 2011-04-20 ENCOUNTER — Ambulatory Visit
Admission: RE | Admit: 2011-04-20 | Discharge: 2011-04-20 | Disposition: A | Discharge status: Home / Self Care | Payer: Medicaid Other | Source: Ambulatory Visit | Attending: Radiation Oncology | Admitting: Radiation Oncology

## 2011-04-20 ENCOUNTER — Ambulatory Visit (HOSPITAL_COMMUNITY)
Admission: RE | Admit: 2011-04-20 | Discharge: 2011-04-20 | Disposition: A | Discharge status: Home / Self Care | Payer: Medicaid Other | Source: Ambulatory Visit | Attending: Radiation Oncology | Admitting: Radiation Oncology

## 2011-04-20 DIAGNOSIS — R29898 Other symptoms and signs involving the musculoskeletal system: Secondary | ICD-10-CM | POA: Insufficient documentation

## 2011-04-20 DIAGNOSIS — M5020 Other cervical disc displacement, unspecified cervical region: Secondary | ICD-10-CM | POA: Insufficient documentation

## 2011-04-20 DIAGNOSIS — C9000 Multiple myeloma not having achieved remission: Secondary | ICD-10-CM | POA: Insufficient documentation

## 2011-04-20 DIAGNOSIS — M79609 Pain in unspecified limb: Secondary | ICD-10-CM | POA: Insufficient documentation

## 2011-04-20 DIAGNOSIS — M542 Cervicalgia: Secondary | ICD-10-CM | POA: Insufficient documentation

## 2011-04-20 MED ORDER — GADOBENATE DIMEGLUMINE 529 MG/ML IV SOLN
13.0000 mL | Freq: Once | INTRAVENOUS | Status: AC | PRN
  Administered 2011-04-20: 13 mL via INTRAVENOUS

## 2011-04-20 NOTE — Telephone Encounter (Signed)
xxx

## 2011-04-23 ENCOUNTER — Ambulatory Visit: Payer: No Typology Code available for payment source

## 2011-04-23 ENCOUNTER — Ambulatory Visit
Admission: RE | Admit: 2011-04-23 | Discharge: 2011-04-23 | Disposition: A | Discharge status: Home / Self Care | Payer: Medicaid Other | Source: Ambulatory Visit | Attending: Radiation Oncology | Admitting: Radiation Oncology

## 2011-04-23 ENCOUNTER — Encounter: Payer: Self-pay | Admitting: Radiation Oncology

## 2011-04-23 DIAGNOSIS — R634 Abnormal weight loss: Secondary | ICD-10-CM

## 2011-04-23 DIAGNOSIS — R112 Nausea with vomiting, unspecified: Secondary | ICD-10-CM

## 2011-04-23 MED ORDER — ONDANSETRON HCL 8 MG PO TABS: 8.0000 mg | ORAL_TABLET | Freq: Three times a day (TID) | ORAL | Status: DC | PRN

## 2011-04-23 NOTE — Progress Notes (Signed)
   Weekly Management Note Current Dose:  1200 cGy  Projected Dose:  2000cGy   Narrative:  The patient presents for routine under treatment assessment.  CBCT/MVCT images/Port film x-rays were reviewed.  The chart was checked. He is over halfway through with radiotherapy to his lower spine. He continues to have pain in his left shoulder and bilateral anterior lower rib cage. He had a C-spine MRI a few days ago and this is fairly unremarkable. I do not think the pain in his left shoulder is originating from myeloma in his cervical spine. He does have some disc protrusion, however.  He is scheduled to [facility] a [facility] today to discuss transplant.  Physical Findings: Weight: 139 lb 9.6 oz (63.322 kg). Filed Vitals:   04/23/11 1100 04/23/11 1105  BP: 112/69 115/73  Pulse: 84 85  Temp: 97.2 F (36.2 C)   TempSrc: Oral   Resp: 20 20  Weight: 139 lb 9.6 oz (63.322 kg)    He is in no acute distress in a wheelchair.  Impression:  The patient is tolerating radiotherapy.  Plan:  Continue radiotherapy as planned. I am going to schedule a simulation this week so that I can plan radiation to his left shoulder and his anterior rib cage as I believe that myelomatous involvement at these sites is responsible for his pain. I will probably fractionate radiation to these subsequent sites differently so that he can complete therapy in about an additional week or so. He also reports vomiting this weekend and his nausea is refractory to prochlorperazine. I am prescribing Zofran for him and instructed him to take it 3 times a day.  Due to weight loss, I am consulting Zenovia Jarred of nutrition to see the patient.

## 2011-04-23 NOTE — Progress Notes (Signed)
Pt ambulating with walker, slow, steady gait, alert and oriented, states pain left shoulder 4-5 , Mri 04/20/11 results, in, Lorin Glass, interpreter in with patient and family, patient has an appt Has an appt at [facility] at 1pm, , pt wearing           (2 ,  = 25 mcg duragesic patches, RFA, took orthostatic blood pressures, sitting =112/69,p=84, rr=20. Sitting b/=115/73, p=85, rr=20, T=97.2,  States" eating ok, drinks 1 bottle water daily , has some nausea, takes med for that, loss of 7.4 lb  Since 04/18/11, not drinking any liquid supplements, normal bowel movements 11:20 AM

## 2011-04-24 ENCOUNTER — Ambulatory Visit
Admission: RE | Admit: 2011-04-24 | Discharge: 2011-04-24 | Disposition: A | Discharge status: Home / Self Care | Payer: Medicaid Other | Source: Ambulatory Visit | Attending: Radiation Oncology | Admitting: Radiation Oncology

## 2011-04-24 ENCOUNTER — Telehealth: Payer: Self-pay | Admitting: *Deleted

## 2011-04-24 MED ORDER — FENTANYL 50 MCG/HR TD PT72: 1.0000 | MEDICATED_PATCH | TRANSDERMAL | Status: DC

## 2011-04-24 NOTE — Telephone Encounter (Signed)
Opened in error

## 2011-04-24 NOTE — Telephone Encounter (Signed)
Call from [employer] RN states pt needs refill on Duragesic Patches.  Left Rx for Dr. Gaylyn Rong to sign tomorrow.  Called Eber Jones back to inform pt can pick up Rx here tomorrow when he comes in for XRT.  She verbalized understanding.

## 2011-04-25 ENCOUNTER — Encounter: Payer: Self-pay | Admitting: Oncology

## 2011-04-25 ENCOUNTER — Ambulatory Visit
Admission: RE | Admit: 2011-04-25 | Discharge: 2011-04-25 | Disposition: A | Discharge status: Home / Self Care | Payer: Medicaid Other | Source: Ambulatory Visit | Attending: Radiation Oncology | Admitting: Radiation Oncology

## 2011-04-25 NOTE — Progress Notes (Signed)
CT simulation treatment planning note  The patient was taken to the CT simulator and laid with his arms above his head in a wing board. Adhesive wire was placed in the areas where he has palpable pain. High-resolution CT axial imaging was obtained of the patient's chest. I placed an isocenter within the left scapula as well as over the right anterior lower rib cage and the left anterior lower rib cage. Skin markings are made for all 3 isocenters and the patient tolerated the procedure well.  Treatment planning note: the patient has pain associated with Left scapula/shoulder as well as the bilateral anterior inferior rib cage. I plan to treat all 3 sites to a dose of approximately 15 gray in 5 fractions. I plan to treat the scapula with an AP PA arrangement using MLCs for custom blocks.  I plan to treat the left anterior rib cage with opposed oblique fields using MLCs for custom blocks. I plan to treat the contralateral rib cage with opposed obliques using MLCs for custom blocks.

## 2011-04-25 NOTE — Progress Notes (Signed)
Patient received two prescriptions from [facility] on 04/24/11 $7.80,his remaning balance [facility] $278.25.

## 2011-04-26 ENCOUNTER — Encounter: Payer: Self-pay | Admitting: Oncology

## 2011-04-26 ENCOUNTER — Ambulatory Visit: Payer: No Typology Code available for payment source

## 2011-04-26 ENCOUNTER — Ambulatory Visit
Admission: RE | Admit: 2011-04-26 | Discharge: 2011-04-26 | Disposition: A | Discharge status: Home / Self Care | Payer: Medicaid Other | Source: Ambulatory Visit | Attending: Radiation Oncology | Admitting: Radiation Oncology

## 2011-04-26 NOTE — Progress Notes (Signed)
Patient received one prescription from  op pharmacy on 04/25/11 $31.35,his remaning balance [facility] $247.40.

## 2011-04-27 ENCOUNTER — Ambulatory Visit
Admission: RE | Admit: 2011-04-27 | Discharge: 2011-04-27 | Disposition: A | Discharge status: Home / Self Care | Payer: Medicaid Other | Source: Ambulatory Visit | Attending: Radiation Oncology | Admitting: Radiation Oncology

## 2011-04-27 ENCOUNTER — Encounter: Payer: Self-pay | Admitting: Radiation Oncology

## 2011-04-30 ENCOUNTER — Ambulatory Visit: Payer: Medicaid Other

## 2011-05-01 ENCOUNTER — Encounter: Payer: Self-pay | Admitting: Oncology

## 2011-05-01 ENCOUNTER — Other Ambulatory Visit: Payer: Self-pay | Admitting: Lab

## 2011-05-01 ENCOUNTER — Ambulatory Visit: Payer: Medicaid Other

## 2011-05-01 ENCOUNTER — Telehealth: Payer: Self-pay | Admitting: Oncology

## 2011-05-01 ENCOUNTER — Telehealth: Payer: Self-pay | Admitting: *Deleted

## 2011-05-01 ENCOUNTER — Ambulatory Visit (HOSPITAL_BASED_OUTPATIENT_CLINIC_OR_DEPARTMENT_OTHER): Payer: Medicaid Other | Admitting: Oncology

## 2011-05-01 ENCOUNTER — Other Ambulatory Visit: Payer: Self-pay | Admitting: *Deleted

## 2011-05-01 DIAGNOSIS — D649 Anemia, unspecified: Secondary | ICD-10-CM

## 2011-05-01 DIAGNOSIS — K5900 Constipation, unspecified: Secondary | ICD-10-CM

## 2011-05-01 DIAGNOSIS — C9000 Multiple myeloma not having achieved remission: Secondary | ICD-10-CM

## 2011-05-01 DIAGNOSIS — D696 Thrombocytopenia, unspecified: Secondary | ICD-10-CM

## 2011-05-01 LAB — CBC WITH DIFFERENTIAL/PLATELET
Basophils Absolute: 0 10*3/uL (ref 0.0–0.1)
Eosinophils Absolute: 0 10*3/uL (ref 0.0–0.5)
HCT: 33 % — ABNORMAL LOW (ref 38.4–49.9)
HGB: 10.7 g/dL — ABNORMAL LOW (ref 13.0–17.1)
LYMPH%: 20.7 % (ref 14.0–49.0)
MCV: 82.7 fL (ref 79.3–98.0)
MONO#: 0.4 10*3/uL (ref 0.1–0.9)
MONO%: 8.6 % (ref 0.0–14.0)
NEUT#: 3.4 10*3/uL (ref 1.5–6.5)
NEUT%: 70.4 % (ref 39.0–75.0)
Platelets: 313 10*3/uL (ref 140–400)
RBC: 3.99 10*6/uL — ABNORMAL LOW (ref 4.20–5.82)
WBC: 4.9 10*3/uL (ref 4.0–10.3)

## 2011-05-01 MED ORDER — LENALIDOMIDE 15 MG PO CAPS: 15.0000 mg | ORAL_CAPSULE | Freq: Every day | ORAL | Status: DC

## 2011-05-01 MED ORDER — OXYCODONE-ACETAMINOPHEN 5-325 MG PO TABS: 1.0000 | ORAL_TABLET | ORAL | Status: DC | PRN

## 2011-05-01 NOTE — Progress Notes (Signed)
Patient received three prescriptions from [facility] on 04/30/11 $19.87,his remaning balance [facility] $227.53.

## 2011-05-01 NOTE — Telephone Encounter (Signed)
Called interpreter Jamelle Haring to assist in explaining to pt need for him to do [employer] survey for refill on Revlimid. She suggested contact [employer] nurse for assistance.  Called Eber Jones, congregational nurse, and she states unable to drive at this time to help pt do survey.  She suggests that pt meet w/ desk nurse tomorrow after his XRT appt..  And desk nurse can help pt w/ survey.   She will inform pt to see Dr. Lodema Pilot nurse tomorrow after his XRT appt.    Pt also has letter from Dr. Gaylyn Rong ready to pick up at desk.

## 2011-05-01 NOTE — Telephone Encounter (Signed)
Shane Woodard called back to notify that pt will not be at [facility] tomorrow d/t XRT was canceled tomorrow for some reason. She says that Shane Woodard will call pt and try to instruct on how to do the [employer] Pt survey for Revlimid.

## 2011-05-01 NOTE — Telephone Encounter (Signed)
chemo appts were not in to provided for pt, he will pick schedule for march-april on 03/06

## 2011-05-01 NOTE — Progress Notes (Signed)
Shane Woodard OFFICE PROGRESS NOTE  Cc:  Shane Housekeeper, MD, MD  DIAGNOSIS: IgG lambda multiple myeloma; presented with anemia, lytic bone lesions. Initial M-spike was 5.1gm/dL; free serum lambda of 1.61 mg/dL; Ig G 7760 mg/dL; [id number]-[id number] of 2.47. Bone marrow biopsy showed 35% plasma cell; cytogenetics per FISH was positive for t(11;14).   CURRENT THERAPY: started on 03/19/11 [name] 1.3mg /m2 SQ d1, 4,8,11 q28 day; Revlimid 25 mg PO d1-21 q28day; Dexamethasone 40mg  PO weekly (even of week off of chemo).  He is also on Acyclovir 400mg  PO BID; Bactrim DS Mon/Wed/Fri; Lovenox 40mg  SQ daily.  Zometa is due to start after he is cleared by dentist.   Chemo has been on hold due to palliative radiation to the lytic bone lesions.   INTERVAL HISTORY: Shane Woodard 51 y.o. male returns for hospital follow up with his congregation nurse and a Systems developer.  He recently finished radiation to the spine.  His back pain has improved significantly with radiation. However, he recently developed right anterior rib pain and there is plan to radiate that as well.  The right rib pain is crampy, worse with movement.  He has been taking Percocet at most 2-3x/day and is still on Fentanyl patch.  He still has constipation; however, he has been taking stool softener everyday.  He has every day or at least q2 day bowel movement now.  He has very good appetite compared to last month.  However, his weight is still decreasing. His stamina is slowly improving compared to last month.   Patient denies headache, visual changes, confusion, drenching night sweats, palpable lymph node swelling, mucositis, odynophagia, dysphagia, nausea vomiting, jaundice, chest pain, palpitation, shortness of breath, dyspnea on exertion, productive cough, gum bleeding, epistaxis, hematemesis, hemoptysis, abdominal pain, abdominal swelling, early satiety, melena, hematochezia, hematuria, skin rash, spontaneous bleeding, joint swelling,  heat or cold intolerance, bowel bladder incontinence, back pain, focal motor weakness, paresthesia, depression, suicidal or homocidal ideation, feeling hopelessness.    MEDICAL HISTORY: Past Medical History  Diagnosis Date  . Anemia   . Arthritis   . Multiple myeloma     SURGICAL HISTORY:  Past Surgical History  Procedure Date  . Left thumb surgery 11/26/2003  . Multiple extractions with alveoloplasty 03/28/2011    Procedure: MULTIPLE EXTRACION WITH ALVEOLOPLASTY;  Surgeon: Charlynne Pander, DDS;  Location: [facility];  Service: Oral Surgery;  Laterality: N/A;  Extraction of tooth #'s 2,3,4,5,6,7,8,9,10,11,12,13,14,15,17, 20,21,22,23,24,25,26,27,28, 31, and 32 with alveoloplasty and mandibular left torus reduction.    MEDICATIONS: Current Outpatient Prescriptions  Medication Sig Dispense Refill  . acyclovir (ZOVIRAX) 400 MG tablet Take 1 tablet (400 mg total) by mouth daily.  30 tablet  3  . cyclobenzaprine (FLEXERIL) 10 MG tablet Take 10 mg by mouth 3 (three) times daily as needed.      Marland Kitchen dexamethasone (DECADRON) 4 MG tablet Take 40 mg by mouth every 7 (seven) days. Mondays.      Marland Kitchen docusate sodium (COLACE) 100 MG capsule Take 100 mg by mouth 3 (three) times daily as needed.      . enoxaparin (LOVENOX) 40 MG/0.4ML SOLN Inject 40 mg into the skin daily.      . fentaNYL (DURAGESIC - DOSED MCG/HR) 50 MCG/HR Place 1 patch (50 mcg total) onto the skin every 3 (three) days.  10 patch  0  . ondansetron (ZOFRAN) 8 MG tablet Take 8 mg by mouth every 8 (eight) hours as needed.      Marland Kitchen oxyCODONE-acetaminophen (PERCOCET) 5-325 MG per  tablet Take 1 tablet by mouth every 4 (four) hours as needed for pain.  100 tablet  0  . potassium chloride SA (K-DUR,KLOR-CON) 20 MEQ tablet Take 1 tablet (20 mEq total) by mouth 2 (two) times daily.  60 tablet  3  . prochlorperazine (COMPAZINE) 10 MG tablet Take 1 tablet (10 mg total) by mouth every 6 (six) hours as needed (Nausea or vomiting).  30 tablet  1  .  Sennosides 15 MG CHEW Chew 2 each by mouth 2 (two) times daily as needed.      . sulfamethoxazole-trimethoprim (BACTRIM DS,SEPTRA DS) 800-160 MG per tablet Take 1 tablet by mouth every Monday, Wednesday, and Friday.      . ondansetron (ZOFRAN) 8 MG tablet Take 1 tablet (8 mg total) by mouth every 8 (eight) hours as needed for nausea (Take 1 tablet every 8 hrs to prevent nausea).  30 tablet  5    ALLERGIES:   has no known allergies.  REVIEW OF SYSTEMS:  The rest of the 14-point review of system was negative.   Filed Vitals:   05/01/11 0831  BP: 127/79  Pulse: 92  Temp: 97.2 F (36.2 C)   Wt Readings from Last 3 Encounters:  05/01/11 133 lb 4.8 oz (60.464 kg)  04/23/11 139 lb 9.6 oz (63.322 kg)  04/18/11 146 lb (66.225 kg)   ECOG Performance status: 1-2  PHYSICAL EXAMINATION:   General: thin-appearing man; no acute distress unless he tries to move. Eyes: no scleral icterus. ENT: edentulous without gum bleeding. Neck was without thyromegaly. Lymphatics: Negative cervical, supraclavicular or axillary adenopathy. Respiratory: lungs were clear bilaterally without wheezing or crackles. Cardiovascular: Regular rate and rhythm, S1/S2, without murmur, rub or gallop. There was no pedal edema. GI: abdomen was soft, flat, nontender, nondistended, without organomegaly. Muscoloskeletal:  No tenderness to palpation of lumbar spine.  There was mild tender to palpation of lower anterior right ribs.  His gait is now normal without walker.  He was able to get on exam table without assistance.   LABS WBC 4.9; Hgb 10.7; Plt 313.    ASSESSMENT AND PLAN:  1. IgG lambda multiple myeloma:  - he has had d1,4,8,and 11 of [name] (last dose 03/29/11). He started his 21 day of Revlimid chemo pill on 03/21/11. The remaining Revlimid from this cycle was discontinued on 03/30/11 due to sepsis.   For this 2nd cycle, since he is still on radiation to the right ribs, and further weight loss, and with last cycle sepsis,  and cytopenia, I will dose reduce to avoid toxicity. Revlimid will be 15mg  PO d1-21.  [name] will be 1.3 mg/m2 SQ d1,8, 15 as opposed to d1,4,8,11 of each 28 day cycle.  Dexamethasone is still 40mg  PO once weekly.   Even though he still has XRT, it is only to the ribs and not the pelvis.  I am concerned about delaying chemo any further given concern for systemic disease progression.   -He is on Acyclovir, Bactrim, Lovenox prophylaxis which I advised him to continue.   - I will start Zometa when he resumes chemo on 05/07/2011.   - He saw [facility] BMT on 04/23/2011 to discuss role of BMT after induction chemo and decision was to proceed with auto BMT in the near future.   I will continue to check monthly   2. Compression fracture: s/p kyphoplasty x2 and radiation with now much better pain control. He is on Fentanyl patch 31mcg/hour change 72 hours for long acting. For short  acting, Percocet has been working for him. He also has Flexeril for muscle spasm.    3. Constipation:  He is on Colace or Senna twice daily.  I advised him to go ahead with a laxative since he has not had a bowel movement for more than a week.   4. Anemia/thrombocytopenia:  plt and WBC have recovered.  He is still slightly anemic.  There is no active bleeding; there is no indication for transfusion.   5. Hypokalemia: stable on KCl 20 mEq PO BID.  Serum K level is pending today.   6. Moderate calorie/protein malnutrition:  He has good appetite.  NO indication for appetite stimulant.  I advised him to increase his Ensure intake to 2-3 cans a day.   7.  Follow up:  He will start chemo on 05/07/11.  I will see him on 05/14/11 the day of week 2 of cycle 2 of chemo.   8.  Social:  He requested a letter stating his diagnosis, treatment and prognosis for Social Security Office which I will dictate separately.   FULL CODE.    The length of time of the face-to-face encounter was 40 minutes. More than 50% of time was spent counseling and  coordination of care.

## 2011-05-02 ENCOUNTER — Ambulatory Visit: Payer: Medicaid Other

## 2011-05-03 ENCOUNTER — Ambulatory Visit: Payer: Self-pay | Admitting: Nutrition

## 2011-05-03 ENCOUNTER — Ambulatory Visit: Payer: Medicaid Other

## 2011-05-03 ENCOUNTER — Encounter: Payer: Self-pay | Admitting: Radiation Oncology

## 2011-05-03 ENCOUNTER — Ambulatory Visit
Admission: RE | Admit: 2011-05-03 | Discharge: 2011-05-03 | Disposition: A | Discharge status: Home / Self Care | Payer: Medicaid Other | Source: Ambulatory Visit | Attending: Radiation Oncology | Admitting: Radiation Oncology

## 2011-05-03 LAB — COMPREHENSIVE METABOLIC PANEL
Alkaline Phosphatase: 108 U/L (ref 39–117)
BUN: 15 mg/dL (ref 6–23)
CO2: 21 mEq/L (ref 19–32)
Glucose, Bld: 106 mg/dL — ABNORMAL HIGH (ref 70–99)
Sodium: 133 mEq/L — ABNORMAL LOW (ref 135–145)
Total Bilirubin: 0.2 mg/dL — ABNORMAL LOW (ref 0.3–1.2)
Total Protein: 9.3 g/dL — ABNORMAL HIGH (ref 6.0–8.3)

## 2011-05-03 LAB — PROTEIN ELECTROPHORESIS, SERUM
Albumin ELP: 41.8 % — ABNORMAL LOW (ref 55.8–66.1)
Alpha-1-Globulin: 4.3 % (ref 2.9–4.9)
Beta 2: 2.2 % — ABNORMAL LOW (ref 3.2–6.5)
Beta Globulin: 4.8 % (ref 4.7–7.2)
Total Protein, Serum Electrophoresis: 9.3 g/dL — ABNORMAL HIGH (ref 6.0–8.3)

## 2011-05-03 LAB — KAPPA/LAMBDA LIGHT CHAINS
Kappa:Lambda Ratio: 0.05 — ABNORMAL LOW (ref 0.26–1.65)
Lambda Free Lght Chn: 0.64 mg/dL (ref 0.57–2.63)

## 2011-05-03 NOTE — Progress Notes (Signed)
Mr. Popoff presents with his cousin and interpreter.  He reports that he is having difficulty eating because of difficulty chewing.  He tolerates liquids okay.  He has attempted some nutrition supplements, but he does not care for them.  He has tried to chop up foods and put it in his rice soup, which he eats 3 times a day; however, he is not eating as much as he needs to eat.  NUTRITION DIAGNOSIS:  Unintended weight loss continues.    The patient meets criteria for severe malnutrition in the context of chronic illness secondary to a 20% weight loss in the last 2 months and the patient's oral intake less than 75% of his estimated energy requirements for greater than 1 month.  INTERVENTION:  I have attempted to encourage the patient to increase his oral intake in small amounts throughout the day.  He is not very receptive to trying to incorporate nutritional supplements into his diet; however, he did agree to try some juice-based supplements today. I have encouraged him to add in healthy fats, such as avocado, into foods that he does enjoy, perhaps making a milkshake using it.  I have encouraged him to eat more protein food, taking into account his cultural dietary beliefs.  I have provided him with some juice-based supplements and encouraged him to continue to try to increase these to twice a day.  MONITORING, EVALUATION AND GOALS:  The patient has been unable to increase his oral intake to minimize further weight loss.  NEXT VISIT:  Patient will contact me with questions or concerns.    ______________________________ Zenovia Jarred, RD, LDN Clinical Nutrition Specialist BN/MEDQ  D:  05/03/2011  T:  05/03/2011  Job:  [id number]

## 2011-05-03 NOTE — Progress Notes (Signed)
Simulation verification note:  The patient underwent simulation verification for treatment to the chest wall/ribs. The isocenter is in good position and the multileaf collimators contour the treatment volume appropriately.

## 2011-05-04 ENCOUNTER — Encounter: Payer: Self-pay | Admitting: Oncology

## 2011-05-04 ENCOUNTER — Other Ambulatory Visit: Payer: Self-pay | Admitting: *Deleted

## 2011-05-04 ENCOUNTER — Telehealth: Payer: Self-pay | Admitting: *Deleted

## 2011-05-04 ENCOUNTER — Ambulatory Visit
Admission: RE | Admit: 2011-05-04 | Discharge: 2011-05-04 | Disposition: A | Discharge status: Home / Self Care | Payer: Medicaid Other | Source: Ambulatory Visit | Attending: Radiation Oncology | Admitting: Radiation Oncology

## 2011-05-04 ENCOUNTER — Ambulatory Visit: Payer: Medicaid Other

## 2011-05-04 NOTE — Telephone Encounter (Signed)
Rec'd another VM from Leonidas at [employer] stating this RN needs to call [employer] to have pt's authorization number ok.  Called [employer] and explained that pt does indeed understand side effects of drug and the auth number released per [employer] rep.  Called Jennifer back at [employer] and left her VM informing of above.

## 2011-05-04 NOTE — Progress Notes (Signed)
Patient received one prescription from [place] [facility] on 05/03/11 $5.89,his remaning balance [facility] $221.64

## 2011-05-04 NOTE — Telephone Encounter (Signed)
Several voice mails back and forth w/ Victorino Dike at [employer] regarding pt's dose of revlimid.  Left VM clarifying current dose is 15 mg and pt to start on 05/07/11.    Rec'd VM back from [place] stating pt did the phone survey,  But he answered that he was not aware that revlimid can cause birth defects, so survey needs to be done again.    I called our Inetrpreter service to request Interpreter this afternoon, so I can assist pt in taking phone survey again and make sure he understands the questions.   Waiting for response regarding interpreter. Left VM for Victorino Dike at [employer] informing her of above.

## 2011-05-04 NOTE — Progress Notes (Signed)
[facility] Rad[facility] Oncology Weekly Treatment Note    Name: Shane Woodard Date: 05/04/2011 MRN: [id number] DOB: 11-22-1960  Status: outpatient    Current dose: 8 gray  Current fraction: 1, the patient also received a separate treatment yesterday.  Planned dose: 8 gray    MEDICATIONS: Current Outpatient Prescriptions  Medication Sig Dispense Refill  . acyclovir (ZOVIRAX) 400 MG tablet Take 1 tablet (400 mg total) by mouth daily.  30 tablet  3  . cyclobenzaprine (FLEXERIL) 10 MG tablet Take 10 mg by mouth 3 (three) times daily as needed.      Marland Kitchen dexamethasone (DECADRON) 4 MG tablet Take 40 mg by mouth every 7 (seven) days. Mondays.      Marland Kitchen docusate sodium (COLACE) 100 MG capsule Take 100 mg by mouth 3 (three) times daily as needed.      . enoxaparin (LOVENOX) 40 MG/0.4ML SOLN Inject 40 mg into the skin daily.      . fentaNYL (DURAGESIC - DOSED MCG/HR) 50 MCG/HR Place 1 patch (50 mcg total) onto the skin every 3 (three) days.  10 patch  0  . lenalidomide (REVLIMID) 15 MG capsule Take 1 capsule (15 mg total) by mouth daily.  21 capsule  0  . ondansetron (ZOFRAN) 8 MG tablet Take 8 mg by mouth every 8 (eight) hours as needed.      Marland Kitchen oxyCODONE-acetaminophen (PERCOCET) 5-325 MG per tablet Take 1 tablet by mouth every 4 (four) hours as needed for pain.  100 tablet  0  . potassium chloride SA (K-DUR,KLOR-CON) 20 MEQ tablet Take 1 tablet (20 mEq total) by mouth 2 (two) times daily.  60 tablet  3  . prochlorperazine (COMPAZINE) 10 MG tablet Take 1 tablet (10 mg total) by mouth every 6 (six) hours as needed (Nausea or vomiting).  30 tablet  1  . Sennosides 15 MG CHEW Chew 2 each by mouth 2 (two) times daily as needed.      . sulfamethoxazole-trimethoprim (BACTRIM DS,SEPTRA DS) 800-160 MG per tablet Take 1 tablet by mouth every Monday, Wednesday, and Friday.         ALLERGIES: Review of patient's allergies indicates no known allergies.   LABORATORY DATA:  Lab Results  Component  Value Date   WBC 4.9 05/01/2011   HGB 10.7* 05/01/2011   HCT 33.0* 05/01/2011   MCV 82.7 05/01/2011   PLT 313 05/01/2011   Lab Results  Component Value Date   NA 133* 05/01/2011   K 4.5 05/01/2011   CL 103 05/01/2011   CO2 21 05/01/2011   Lab Results  Component Value Date   ALT 30 05/01/2011   AST 25 05/01/2011   ALKPHOS 108 05/01/2011   BILITOT 0.2* 05/01/2011      NARRATIVE: Shane Woodard was seen today for weekly treatment management. The chart was checked and port films images were reviewed. The patient's films look good today which were reviewed. The patient indicates that he did not have any problem with this treatment today. He has no new complaints. He denies any pain or nausea. He does have Percocet to take on a when necessary basis.  PHYSICAL EXAMINATION: weight is 136 lb 14.4 oz (62.097 kg).      ASSESSMENT: Patient tolerating treatments well.    PLAN: Continue treatment as planned.

## 2011-05-04 NOTE — Progress Notes (Signed)
[facility] Rad[facility]cology Simulation Verification Note   Name: Shane Woodard MRN: [id number]  Date: 05/04/2011  DOB: [birth year]    DIAGNOSIS: The encounter diagnosis was Multiple myeloma.   NARRATIVE: The patient presented for simulation verification today. The isocenter is accurately placed. The fields target the correct area. We will proceed with the patient's treatment as planned.

## 2011-05-04 NOTE — Progress Notes (Signed)
Here for routine weekly visit with md. Pt. Completed an additional treatment of left anterior ribs and left scapula. Denies pain and nausea. Has percocet to take if needed. Main concern is fatigue.No questions today. Will schedule for 1 month follow up with Dr.Squire.

## 2011-05-04 NOTE — Telephone Encounter (Signed)
Spoke w/ Jamelle Haring, [place] interpreter, she reports that pt's mother in law assisted him to call [employer] and everything should be ok now for him to get his Revlimid.  I called Victorino Dike at [employer] and left VM asking her to call back if need anything else from this RN so pt's revlimid could be sent out.

## 2011-05-07 ENCOUNTER — Ambulatory Visit (HOSPITAL_BASED_OUTPATIENT_CLINIC_OR_DEPARTMENT_OTHER): Payer: Medicaid Other

## 2011-05-07 ENCOUNTER — Encounter (HOSPITAL_COMMUNITY): Payer: Self-pay | Admitting: Dentistry

## 2011-05-07 ENCOUNTER — Other Ambulatory Visit: Payer: Self-pay | Admitting: *Deleted

## 2011-05-07 ENCOUNTER — Telehealth (HOSPITAL_COMMUNITY): Payer: Self-pay | Admitting: Dentistry

## 2011-05-07 ENCOUNTER — Ambulatory Visit: Payer: Medicaid Other

## 2011-05-07 DIAGNOSIS — C9000 Multiple myeloma not having achieved remission: Secondary | ICD-10-CM

## 2011-05-07 DIAGNOSIS — Z5112 Encounter for antineoplastic immunotherapy: Secondary | ICD-10-CM

## 2011-05-07 LAB — CBC WITH DIFFERENTIAL/PLATELET
BASO%: 1.4 % (ref 0.0–2.0)
EOS%: 7.6 % — ABNORMAL HIGH (ref 0.0–7.0)
HGB: 10.8 g/dL — ABNORMAL LOW (ref 13.0–17.1)
MCH: 26.2 pg — ABNORMAL LOW (ref 27.2–33.4)
MCHC: 31.9 g/dL — ABNORMAL LOW (ref 32.0–36.0)
MONO#: 0.3 10*3/uL (ref 0.1–0.9)
RDW: 18.1 % — ABNORMAL HIGH (ref 11.0–14.6)
WBC: 2.9 10*3/uL — ABNORMAL LOW (ref 4.0–10.3)
lymph#: 1 10*3/uL (ref 0.9–3.3)

## 2011-05-07 MED ORDER — SODIUM CHLORIDE 0.9 % IV SOLN
INTRAVENOUS | Status: DC
  Administered 2011-05-07: 11:00:00 via INTRAVENOUS

## 2011-05-07 MED ORDER — ONDANSETRON HCL 8 MG PO TABS
8.0000 mg | ORAL_TABLET | Freq: Once | ORAL | Status: AC
  Administered 2011-05-07: 8 mg via ORAL

## 2011-05-07 MED ORDER — BORTEZOMIB CHEMO SQ INJECTION 3.5 MG (2.5MG/ML)
1.3000 mg/m2 | Freq: Once | INTRAMUSCULAR | Status: AC
  Administered 2011-05-07: 2.25 mg via SUBCUTANEOUS
  Filled 2011-05-07: qty 2.25

## 2011-05-07 MED ORDER — ZOLEDRONIC ACID 4 MG/100ML IV SOLN
4.0000 mg | Freq: Once | INTRAVENOUS | Status: AC
  Administered 2011-05-07: 4 mg via INTRAVENOUS
  Filled 2011-05-07: qty 100

## 2011-05-07 NOTE — Patient Instructions (Signed)
Patient aware of next appointment; discharged home with friends and no complaints.

## 2011-05-07 NOTE — Progress Notes (Signed)
Okay to treat today, despite labs, per Dr. Ha. 

## 2011-05-08 ENCOUNTER — Encounter: Payer: Self-pay | Admitting: *Deleted

## 2011-05-08 ENCOUNTER — Encounter: Payer: Self-pay | Admitting: Oncology

## 2011-05-08 ENCOUNTER — Telehealth: Payer: Self-pay | Admitting: *Deleted

## 2011-05-08 ENCOUNTER — Ambulatory Visit: Payer: Medicaid Other

## 2011-05-08 NOTE — Progress Notes (Signed)
Patient received one prescription from [place] [facility] on 05/07/11 $4.80,his remaning balance [facility] $216.84.

## 2011-05-08 NOTE — Telephone Encounter (Signed)
VM left by congregational RN to inform that pt did not receive his Revlimid yesterday for some reason, but did get it today and started it today.

## 2011-05-08 NOTE — Progress Notes (Signed)
RECEIVED A FAX FROM [facility] CONCERNING A CONFIRMATION OF PRESCRIPTION SHIPMENT FOR REVLIMID. 

## 2011-05-09 ENCOUNTER — Ambulatory Visit: Payer: Medicaid Other

## 2011-05-09 ENCOUNTER — Encounter: Payer: Self-pay | Admitting: Radiation Oncology

## 2011-05-09 ENCOUNTER — Ambulatory Visit (HOSPITAL_COMMUNITY): Payer: Self-pay | Admitting: Dentistry

## 2011-05-09 ENCOUNTER — Encounter: Payer: Self-pay | Admitting: Oncology

## 2011-05-09 VITALS — BP 120/70 | HR 76 | Temp 97.0°F

## 2011-05-09 DIAGNOSIS — K08109 Complete loss of teeth, unspecified cause, unspecified class: Secondary | ICD-10-CM

## 2011-05-09 DIAGNOSIS — Z463 Encounter for fitting and adjustment of dental prosthetic device: Secondary | ICD-10-CM

## 2011-05-09 NOTE — Progress Notes (Signed)
Wednesday, May 09, 2011  BP: 120/70         P:  76            T:  97.0  Patient Active Problem List  Diagnoses  . Anemia  . Arthritis  . Multiple myeloma  . Hyponatremia   No Known Allergies  Current Outpatient Prescriptions on File Prior to Visit  Medication Sig Dispense Refill  . acyclovir (ZOVIRAX) 400 MG tablet Take 1 tablet (400 mg total) by mouth daily.  30 tablet  3  . cyclobenzaprine (FLEXERIL) 10 MG tablet Take 10 mg by mouth 3 (three) times daily as needed.      Marland Kitchen dexamethasone (DECADRON) 4 MG tablet Take 40 mg by mouth every 7 (seven) days. Mondays.      Marland Kitchen docusate sodium (COLACE) 100 MG capsule Take 100 mg by mouth 3 (three) times daily as needed.      . enoxaparin (LOVENOX) 40 MG/0.4ML SOLN Inject 40 mg into the skin daily.      . fentaNYL (DURAGESIC - DOSED MCG/HR) 50 MCG/HR Place 1 patch (50 mcg total) onto the skin every 3 (three) days.  10 patch  0  . lenalidomide (REVLIMID) 15 MG capsule Take 1 capsule (15 mg total) by mouth daily.  21 capsule  0  . ondansetron (ZOFRAN) 8 MG tablet Take 8 mg by mouth every 8 (eight) hours as needed.      Marland Kitchen oxyCODONE-acetaminophen (PERCOCET) 5-325 MG per tablet Take 1 tablet by mouth every 4 (four) hours as needed for pain.  100 tablet  0  . potassium chloride SA (K-DUR,KLOR-CON) 20 MEQ tablet Take 1 tablet (20 mEq total) by mouth 2 (two) times daily.  60 tablet  3  . prochlorperazine (COMPAZINE) 10 MG tablet Take 1 tablet (10 mg total) by mouth every 6 (six) hours as needed (Nausea or vomiting).  30 tablet  1  . Sennosides 15 MG CHEW Chew 2 each by mouth 2 (two) times daily as needed.      . sulfamethoxazole-trimethoprim (BACTRIM DS,SEPTRA DS) 800-160 MG per tablet Take 1 tablet by mouth every Monday, Wednesday, and Friday.       Shane Woodard presents for start of upper and lower complete denture fabrication. Interpreter is present.  Exam: Patient is edentulous.  Discussed procedures involved in upper and lower complete denture  fabrication and prognosis for successful ability to wear dentures. Price for dentures confirmed. Patient agrees to proceed with upper and lower complete denture fabrication. Procedure: Upper and lower complete denture primary impressions in alginate. Lab pour. To [facility] for custom impression tray fabrication. RTC for upper and lower complete denture final impressions. Dr. Cindra Eves

## 2011-05-09 NOTE — Progress Notes (Signed)
Lauren our Child psychotherapist called me today and asked if patient has medicaid and if patient has medicaid does it cover dental. Patient does have medicaid but I told Lauren that I would need to get in contact with his case worker to see if the medicaid covers dental or not.

## 2011-05-09 NOTE — Progress Notes (Signed)
[facility]  Name:Shane Woodard  Date:05/09/2011           ZOX:[id number] DOB:1960-03-27   Status:outpatient    DIAGNOSIS: MULTIPLE MYELOMA    INDICATION FOR TREATMENT: Palliative   TREATMENT DATES: 05-03-11 to 05-04-11                          SITE/DOSE:         Patient received radiotherapy to his right inferior/anterior rib cage, left inferior/anterior rib cage and his left scapula/shoulder. All 3 sites received 800 cGy in 1 fraction.                   BEAMS/ENERGY:    All 3 sites were treated with opposed obliques. 6 MV photons were used for his rib fields and 10 MV photons were used for his left scapular/shoulder fields.            NARRATIVE:   The patient tolerated his radiotherapy well.                  PLAN: Routine followup in one month. Patient instructed to call if questions or worsening complaints in interim.

## 2011-05-09 NOTE — Progress Notes (Signed)
[facility]  Name:Shane Woodard  Date:05/09/2011           JXB:[id number] DOB:07-05-1960   Status:outpatient    DIAGNOSIS: Multiple myeloma    INDICATION FOR TREATMENT: Palliative   TREATMENT DATES: 04/16/2011 through 04/27/2011                         SITE/DOSE:   T11-L5, 20 gray in 10 fractions                         BEAMS/ENERGY:  AP PA        / 18 MV photons         NARRATIVE:       The patient tolerated his treatments well.                     PLAN: Routine followup in one month. Patient instructed to call if questions or worsening complaints in interim.

## 2011-05-10 ENCOUNTER — Ambulatory Visit: Payer: Medicaid Other

## 2011-05-10 ENCOUNTER — Ambulatory Visit: Payer: Self-pay

## 2011-05-11 ENCOUNTER — Ambulatory Visit: Payer: Medicaid Other

## 2011-05-14 ENCOUNTER — Ambulatory Visit: Payer: Medicaid Other

## 2011-05-14 ENCOUNTER — Ambulatory Visit (HOSPITAL_BASED_OUTPATIENT_CLINIC_OR_DEPARTMENT_OTHER): Payer: Medicaid Other | Admitting: Oncology

## 2011-05-14 ENCOUNTER — Ambulatory Visit (HOSPITAL_BASED_OUTPATIENT_CLINIC_OR_DEPARTMENT_OTHER): Payer: Medicaid Other

## 2011-05-14 ENCOUNTER — Other Ambulatory Visit (HOSPITAL_BASED_OUTPATIENT_CLINIC_OR_DEPARTMENT_OTHER): Payer: Medicaid Other | Admitting: Lab

## 2011-05-14 ENCOUNTER — Encounter: Payer: Self-pay | Admitting: Oncology

## 2011-05-14 ENCOUNTER — Telehealth: Payer: Self-pay | Admitting: Oncology

## 2011-05-14 DIAGNOSIS — D649 Anemia, unspecified: Secondary | ICD-10-CM

## 2011-05-14 DIAGNOSIS — C9000 Multiple myeloma not having achieved remission: Secondary | ICD-10-CM

## 2011-05-14 DIAGNOSIS — Z5112 Encounter for antineoplastic immunotherapy: Secondary | ICD-10-CM

## 2011-05-14 LAB — COMPREHENSIVE METABOLIC PANEL
ALT: 20 U/L (ref 0–53)
Albumin: 3.2 g/dL — ABNORMAL LOW (ref 3.5–5.2)
CO2: 21 mEq/L (ref 19–32)
Chloride: 105 mEq/L (ref 96–112)
Potassium: 4.9 mEq/L (ref 3.5–5.3)
Sodium: 133 mEq/L — ABNORMAL LOW (ref 135–145)
Total Bilirubin: 0.2 mg/dL — ABNORMAL LOW (ref 0.3–1.2)
Total Protein: 7.7 g/dL (ref 6.0–8.3)

## 2011-05-14 LAB — CBC WITH DIFFERENTIAL/PLATELET
Eosinophils Absolute: 0.1 10*3/uL (ref 0.0–0.5)
HGB: 10.5 g/dL — ABNORMAL LOW (ref 13.0–17.1)
MONO#: 0.1 10*3/uL (ref 0.1–0.9)
MONO%: 3.1 % (ref 0.0–14.0)
NEUT#: 2.2 10*3/uL (ref 1.5–6.5)
RBC: 4.02 10*6/uL — ABNORMAL LOW (ref 4.20–5.82)
RDW: 18.1 % — ABNORMAL HIGH (ref 11.0–14.6)
WBC: 3.2 10*3/uL — ABNORMAL LOW (ref 4.0–10.3)
lymph#: 0.8 10*3/uL — ABNORMAL LOW (ref 0.9–3.3)
nRBC: 0 % (ref 0–0)

## 2011-05-14 MED ORDER — ONDANSETRON HCL 8 MG PO TABS
8.0000 mg | ORAL_TABLET | Freq: Once | ORAL | Status: AC
  Administered 2011-05-14: 8 mg via ORAL

## 2011-05-14 MED ORDER — BORTEZOMIB CHEMO SQ INJECTION 3.5 MG (2.5MG/ML)
1.3000 mg/m2 | Freq: Once | INTRAMUSCULAR | Status: AC
  Administered 2011-05-14: 2.25 mg via SUBCUTANEOUS
  Filled 2011-05-14: qty 2.25

## 2011-05-14 NOTE — Telephone Encounter (Signed)
appt made and printed for pt aom °

## 2011-05-14 NOTE — Patient Instructions (Signed)
[facility] Discharge Instructions for Patients Receiving Chemotherapy  Today you received the following chemotherapy agents velcade  To help prevent nausea and vomiting after your treatment, we encourage you to take your nausea medication  Begin taking it at as often as prescribed     If you develop nausea and vomiting that is not controlled by your nausea medication, call the clinic. If it is after clinic hours your family physician or the after hours number for the clinic or go to the Emergency Department.   BELOW ARE SYMPTOMS THAT SHOULD BE REPORTED IMMEDIATELY:  *FEVER GREATER THAN 100.5 F  *CHILLS WITH OR WITHOUT FEVER  NAUSEA AND VOMITING THAT IS NOT CONTROLLED WITH YOUR NAUSEA MEDICATION  *UNUSUAL SHORTNESS OF BREATH  *UNUSUAL BRUISING OR BLEEDING  TENDERNESS IN MOUTH AND THROAT WITH OR WITHOUT PRESENCE OF ULCERS  *URINARY PROBLEMS  *BOWEL PROBLEMS  UNUSUAL RASH Items with * indicate a potential emergency and should be followed up as soon as possible.  One of the nurses will contact you 24 hours after your treatment. Please let the nurse know about any problems that you may have experienced. Feel free to call the clinic you have any questions or concerns. The clinic phone number is [phone].   I have been informed and understand all the instructions given to me. I know to contact the clinic, my physician, or go to the Emergency Department if any problems should occur. I do not have any questions at this time, but understand that I may call the clinic during office hours   should I have any questions or need assistance in obtaining follow up care.    __________________________________________  _____________  __________ Signature of Patient or Authorized Representative            Date                   Time    __________________________________________ Nurse's Signature

## 2011-05-14 NOTE — Progress Notes (Signed)
[facility] OFFICE PROGRESS NOTE  Cc:  [name], MD, MD  DIAGNOSIS: IgG lambda multiple myeloma; presented with anemia, lytic bone lesions. Initial M-spike was 5.1gm/dL; free serum lambda of 3.24 mg/dL; Ig G 7760 mg/dL; [id number]-2-[id number] of 2.47. Bone marrow biopsy showed 35% plasma cell; cytogenetics per FISH was positive for t(11;14).   CURRENT THERAPY: Initially started on 03/19/11 [name] 1.3mg /m2 SQ d1, 4,8,11 q28 day; Revlimid 25 mg PO d1-21 q28day; Dexamethasone 40mg  PO weekly (even of week off of chemo). He received d1,4,8,and 11 of [name] (last dose 03/29/11). He started his 21 day of Revlimid chemo pill on 03/21/11. The remaining Revlimid from this cycle was discontinued on 03/30/11 due to sepsis. For this 2nd cycle dose was reduced to avoid toxicity d/t sepsis, weight loss, and cytopenia with first cycle. Revlimid is15mg  PO d1-21.  [name] will be 1.3 mg/m2 SQ d1,8, 15 as opposed to d1,4,8,11 of each 28 day cycle.  Dexamethasone is still 40mg  PO once weekly. He is also on Acyclovir 400mg  PO BID; Bactrim DS Mon/Wed/Fri; Lovenox 40mg  SQ daily. Chemo has been on hold due to palliative radiation to the lytic bone lesions.   INTERVAL HISTORY: Jeshawn Melucci 51 y.o. male returns for routine follow-up prior to Day 8 of [name] with his father-in-law and [place] Nurse, learning disability. No complaints of pain today. He remains on Duragesic patch and Percocet PRN. He still has constipation; however, he has been taking stool softener everyday.  He has every day or at least q2 day bowel movement now. He reports that he is eating well. Weight stable since last visit. His stamina continued to improve.    Patient denies headache, visual changes, confusion, drenching night sweats, palpable lymph node swelling, mucositis, odynophagia, dysphagia, nausea vomiting, jaundice, chest pain, palpitation, shortness of breath, dyspnea on exertion, productive cough, gum bleeding, epistaxis, hematemesis, hemoptysis,  abdominal pain, abdominal swelling, early satiety, melena, hematochezia, hematuria, skin rash, spontaneous bleeding, joint swelling, heat or cold intolerance, bowel bladder incontinence, back pain, focal motor weakness, paresthesia, depression, suicidal or homocidal ideation, feeling hopelessness.    MEDICAL HISTORY: Past Medical History  Diagnosis Date  . Anemia   . Arthritis   . Multiple myeloma     SURGICAL HISTORY:  Past Surgical History  Procedure Date  . Left thumb surgery 11/26/2003  . Multiple extractions with alveoloplasty 03/28/2011    Procedure: MULTIPLE EXTRACION WITH ALVEOLOPLASTY;  Surgeon: Charlynne Pander, DDS;  Location: [facility];  Service: Oral Surgery;  Laterality: N/A;  Extraction of tooth #'s 2,3,4,5,6,7,8,9,10,11,12,13,14,15,17, 20,21,22,23,24,25,26,27,28, 31, and 32 with alveoloplasty and mandibular left torus reduction.    MEDICATIONS: Current Outpatient Prescriptions  Medication Sig Dispense Refill  . acyclovir (ZOVIRAX) 400 MG tablet Take 1 tablet (400 mg total) by mouth daily.  30 tablet  3  . cyclobenzaprine (FLEXERIL) 10 MG tablet Take 10 mg by mouth 3 (three) times daily as needed.      Marland Kitchen dexamethasone (DECADRON) 4 MG tablet Take 40 mg by mouth every 7 (seven) days. Mondays.      Marland Kitchen docusate sodium (COLACE) 100 MG capsule Take 100 mg by mouth 3 (three) times daily as needed.      . enoxaparin (LOVENOX) 40 MG/0.4ML SOLN Inject 40 mg into the skin daily.      . fentaNYL (DURAGESIC - DOSED MCG/HR) 50 MCG/HR Place 1 patch (50 mcg total) onto the skin every 3 (three) days.  10 patch  0  . lenalidomide (REVLIMID) 15 MG capsule Take 1 capsule (15 mg total)  by mouth daily.  21 capsule  0  . ondansetron (ZOFRAN) 8 MG tablet Take 8 mg by mouth every 8 (eight) hours as needed.      Marland Kitchen oxyCODONE-acetaminophen (PERCOCET) 5-325 MG per tablet Take 1 tablet by mouth every 4 (four) hours as needed for pain.  100 tablet  0  . potassium chloride SA (K-DUR,KLOR-CON) 20 MEQ tablet  Take 1 tablet (20 mEq total) by mouth 2 (two) times daily.  60 tablet  3  . prochlorperazine (COMPAZINE) 10 MG tablet Take 1 tablet (10 mg total) by mouth every 6 (six) hours as needed (Nausea or vomiting).  30 tablet  1  . Sennosides 15 MG CHEW Chew 2 each by mouth 2 (two) times daily as needed.      . sulfamethoxazole-trimethoprim (BACTRIM DS,SEPTRA DS) 800-160 MG per tablet Take 1 tablet by mouth every Monday, Wednesday, and Friday.       No current facility-administered medications for this visit.   Facility-Administered Medications Ordered in Other Visits  Medication Dose Route Frequency Provider Last Rate Last Dose  . bortezomib SQ ([name]) chemo injection 2.25 mg  1.3 mg/m2 (Treatment Plan Actual) Subcutaneous Once Exie Parody, MD      . ondansetron [facility]) tablet 8 mg  8 mg Oral Once Exie Parody, MD   8 mg at 05/14/11 1013    ALLERGIES:   has no known allergies.  REVIEW OF SYSTEMS:  The rest of the 14-point review of system was negative.   Filed Vitals:   05/14/11 0902  BP: 111/71  Pulse: 93  Temp: 98.7 F (37.1 C)   Wt Readings from Last 3 Encounters:  05/14/11 136 lb 12.8 oz (62.052 kg)  05/04/11 136 lb 14.4 oz (62.097 kg)  05/01/11 133 lb 4.8 oz (60.464 kg)   ECOG Performance status: 1-2  PHYSICAL EXAMINATION:   General: thin-appearing man; no acute distress unless he tries to move. Eyes: no scleral icterus. ENT: edentulous without gum bleeding. Neck was without thyromegaly. Lymphatics: Negative cervical, supraclavicular or axillary adenopathy. Respiratory: lungs were clear bilaterally without wheezing or crackles. Cardiovascular: Regular rate and rhythm, S1/S2, without murmur, rub or gallop. There was no pedal edema. GI: abdomen was soft, flat, nontender, nondistended, without organomegaly. Muscoloskeletal:  No tenderness to palpation of lumbar spine.  There was mild tender to palpation of lower anterior right ribs.  His gait is now normal without walker.  He was able to  get on exam table without assistance.   LABS WBC 3.2, Hgb 10.5, Hct 32.6, Plt 170,000. M-spike 3.02 on 05/01/11   ASSESSMENT AND PLAN:  1. IgG lambda multiple myeloma: Patient will proceed with cycle 2 day 8 of [name] today.[name] dose is currently 1.3 mg/m2 sq Day 1, 8, & 15 as opposed to d1,4,8,11 of each 28 day cycle (dose reduced after cycle # 1 d/t sepsis, weight loss, and cytopenias.  Dexamethasone is still 40mg  PO once weekly.  Revlimid is 15mg  PO d1-21. He will continue Acyclovir, Bactrim, and Lovenox prophylaxis. Zometa for bone metastasis (started on 05/07/11). He saw [facility] BMT on 04/23/2011 to discuss role of BMT after induction chemo and decision was to proceed with auto BMT in the near future.   I will continue to check monthly   2. Compression fracture: s/p kyphoplasty x2 and radiation with now much better pain control. He is on Fentanyl patch 81mcg/hour change 72 hours for long acting. For short acting, Percocet has been working for him. He also has Flexeril for  muscle spasm.    3. Constipation:  He is on Colace or Senna twice daily.  This has been effective and will continue current plan.  4. Anemia/thrombocytopenia:  plt and WBC have recovered.  He is still slightly anemic.  There is no active bleeding; there is no indication for transfusion.   5. Hypokalemia: stable on KCl 20 mEq PO BID.  Serum K level is pending today.   6. Moderate calorie/protein malnutrition:  He has good appetite and weight is stable.  No indication for appetite stimulant.  Continue Ensure 2-3 cans a day.   7.  Follow up:  He will proceed with [name] next week as scheduled. He will receive Zometa as scheduled on 05/28/11.He will have a follow-up visit with Dr Gaylyn Rong on 05/31/11 prior to beginning cycle #3 of [name] and Revlimid on 06/04/11.  FULL CODE.    Patient was seen with Dr Gaylyn Rong. >90% of the treatment plan, assessment, and visit was performed by Dr Gaylyn Rong. The length of time of the face-to-face encounter was  40 minutes. More than 50% of time was spent counseling and coordination of care.

## 2011-05-15 ENCOUNTER — Telehealth: Payer: Self-pay | Admitting: *Deleted

## 2011-05-15 ENCOUNTER — Ambulatory Visit: Payer: Medicaid Other

## 2011-05-15 NOTE — Telephone Encounter (Signed)
Contacts         Type  Contact  Phone    04/09/2011 10:32 AM  Phone (Outgoing)  Halbert, Jesson (Self)  [phone] (H)    Completed- Called pt to try to schedule// he advised he has an appt w/ Dr. Gaylyn Rong Tuesday//Called Dr. Alfonso Ramus office// Was told that I would receive a call back as to whether or not we can reschedule the F/U appt with him for the Kyphoplasty

## 2011-05-15 NOTE — Telephone Encounter (Signed)
[employer] [facility], [facility], AT [phone] STATES PT. MUST PAY FOR CHEMO MEDICATION WHICH WAS DELIVERED AND PT HAS TAKEN. THIS NOTE TO DR.HA'S NURSE'S DESK AND TO MANAGED CARE BIN FOR EBONY.

## 2011-05-16 ENCOUNTER — Ambulatory Visit (HOSPITAL_COMMUNITY): Payer: Self-pay | Admitting: Dentistry

## 2011-05-16 ENCOUNTER — Ambulatory Visit: Payer: Medicaid Other

## 2011-05-16 ENCOUNTER — Encounter: Payer: Self-pay | Admitting: Oncology

## 2011-05-16 VITALS — BP 117/68 | HR 85 | Temp 97.6°F

## 2011-05-16 DIAGNOSIS — Z463 Encounter for fitting and adjustment of dental prosthetic device: Secondary | ICD-10-CM

## 2011-05-16 DIAGNOSIS — K08109 Complete loss of teeth, unspecified cause, unspecified class: Secondary | ICD-10-CM

## 2011-05-16 NOTE — Progress Notes (Signed)
Wednesday, May 16, 2011   BP 117/68  Pulse 85  Temp(Src) 97.6 F (36.4 C) (Oral)   Hughey Rittenberry presents with interpreter  for continued upper and lower complete denture fabrication. Procedure: Upper and lower complete denture border molding and final impressions in [employer]. Patient tolerated procedure well. To [employer] for custom baseplates with rims. Return to clinic for upper and lower complete denture jaw relations.  Dr. Cindra Eves

## 2011-05-16 NOTE — Progress Notes (Signed)
Spoke with case worker for [facility], medicaid does not cover dental. I let  Lauren our social worker know this.

## 2011-05-17 ENCOUNTER — Ambulatory Visit: Payer: No Typology Code available for payment source

## 2011-05-17 ENCOUNTER — Ambulatory Visit: Payer: Medicaid Other

## 2011-05-18 ENCOUNTER — Ambulatory Visit: Payer: Medicaid Other

## 2011-05-18 ENCOUNTER — Other Ambulatory Visit: Payer: Self-pay | Admitting: *Deleted

## 2011-05-18 MED ORDER — OXYCODONE-ACETAMINOPHEN 5-325 MG PO TABS: 1.0000 | ORAL_TABLET | ORAL | Status: DC | PRN

## 2011-05-18 NOTE — Telephone Encounter (Signed)
VM from congregational nurse, Eber Jones,  States pt's mother in law says pt will run out of percocet on Sunday and they request refill.  Rx left in Rx book in injection room.  Called Eber Jones back and informed her of Rx ready for pick up and she says pt's mother in law will come by to pick it up today before 5pm.

## 2011-05-21 ENCOUNTER — Encounter: Payer: Self-pay | Admitting: Oncology

## 2011-05-21 ENCOUNTER — Ambulatory Visit: Payer: Medicaid Other

## 2011-05-21 ENCOUNTER — Other Ambulatory Visit: Payer: Self-pay | Admitting: Oncology

## 2011-05-21 ENCOUNTER — Ambulatory Visit (HOSPITAL_BASED_OUTPATIENT_CLINIC_OR_DEPARTMENT_OTHER): Payer: Medicaid Other

## 2011-05-21 DIAGNOSIS — C9000 Multiple myeloma not having achieved remission: Secondary | ICD-10-CM

## 2011-05-21 DIAGNOSIS — Z5112 Encounter for antineoplastic immunotherapy: Secondary | ICD-10-CM

## 2011-05-21 LAB — CBC WITH DIFFERENTIAL/PLATELET
BASO%: 0.5 % (ref 0.0–2.0)
LYMPH%: 29.9 % (ref 14.0–49.0)
MCHC: 32.9 g/dL (ref 32.0–36.0)
MONO#: 0.4 10*3/uL (ref 0.1–0.9)
MONO%: 8 % (ref 0.0–14.0)
Platelets: 191 10*3/uL (ref 140–400)
RBC: 4.15 10*6/uL — ABNORMAL LOW (ref 4.20–5.82)
WBC: 5 10*3/uL (ref 4.0–10.3)

## 2011-05-21 MED ORDER — BORTEZOMIB CHEMO SQ INJECTION 3.5 MG (2.5MG/ML)
1.3000 mg/m2 | Freq: Once | INTRAMUSCULAR | Status: AC
  Administered 2011-05-21: 2.25 mg via SUBCUTANEOUS
  Filled 2011-05-21: qty 2.25

## 2011-05-21 MED ORDER — ONDANSETRON HCL 8 MG PO TABS
8.0000 mg | ORAL_TABLET | Freq: Once | ORAL | Status: AC
  Administered 2011-05-21: 8 mg via ORAL

## 2011-05-21 NOTE — Patient Instructions (Signed)
Pt tolerated chemo without problems today.   Pt aware of next return appt.   Pt was stable at discharge with family.

## 2011-05-21 NOTE — Progress Notes (Signed)
Patient received two prescriptions from  op pharmacy on 05/21/11 $11.41,his remaninig balance [facility] $205.43.

## 2011-05-22 ENCOUNTER — Other Ambulatory Visit: Payer: Self-pay | Admitting: Oncology

## 2011-05-22 ENCOUNTER — Ambulatory Visit: Payer: Medicaid Other

## 2011-05-22 MED ORDER — FENTANYL 50 MCG/HR TD PT72: 1.0000 | MEDICATED_PATCH | TRANSDERMAL | Status: AC

## 2011-05-23 ENCOUNTER — Ambulatory Visit: Payer: Medicaid Other

## 2011-05-24 ENCOUNTER — Encounter: Payer: Self-pay | Admitting: *Deleted

## 2011-05-24 ENCOUNTER — Ambulatory Visit (HOSPITAL_COMMUNITY): Payer: Self-pay | Admitting: Dentistry

## 2011-05-24 ENCOUNTER — Ambulatory Visit: Payer: No Typology Code available for payment source

## 2011-05-24 ENCOUNTER — Ambulatory Visit: Payer: Medicaid Other

## 2011-05-24 VITALS — BP 120/76 | HR 88 | Temp 98.1°F

## 2011-05-24 DIAGNOSIS — Z463 Encounter for fitting and adjustment of dental prosthetic device: Secondary | ICD-10-CM

## 2011-05-24 DIAGNOSIS — K08109 Complete loss of teeth, unspecified cause, unspecified class: Secondary | ICD-10-CM

## 2011-05-24 NOTE — Progress Notes (Signed)
-  Thursday, May 24, 2011  BP: 120/76            P:  88           T: 98.1   Shane Woodard presents with interpreter for continued upper and lower complete denture fabrication. Procedure: Upper and lower complete denture Jaw relations with aluwax bite registration. Patient  agrees to tooth selection of 22E, H, 10 degree posteriors to match with Portrait A1 shade.  Very difficult bite registration in centric relation due to language barrier. Patient tolerated procedure well. Return to clinic for upper and lower complete denture wax tryin. Dr. Cindra Eves

## 2011-05-25 ENCOUNTER — Encounter: Payer: Self-pay | Admitting: Radiation Oncology

## 2011-05-25 ENCOUNTER — Ambulatory Visit: Payer: Medicaid Other

## 2011-05-25 ENCOUNTER — Ambulatory Visit
Admission: RE | Admit: 2011-05-25 | Discharge: 2011-05-25 | Disposition: A | Discharge status: Home / Self Care | Payer: Medicaid Other | Source: Ambulatory Visit | Attending: Radiation Oncology | Admitting: Radiation Oncology

## 2011-05-25 ENCOUNTER — Telehealth: Payer: Self-pay | Admitting: Oncology

## 2011-05-25 NOTE — Progress Notes (Signed)
First Follow-up since the end of Radiation Therapy.  Continues on [name] and receives Zometa montly.  Denies any pain in prior treatment field and denies any new pain. States he has a "very good" appetite. Has gained  8 lbs since 05/14/11.   Some fatigue and takes daily naps but able to sleep through the night.

## 2011-05-25 NOTE — Telephone Encounter (Signed)
cancelled per [facility]. called pt and informed him of canceled appt

## 2011-05-25 NOTE — Progress Notes (Signed)
[facility] Radiation Oncology Follow up Note  Name: Shane Woodard   Date: 05/25/2011    MRN: [id number] DOB: 10/24/1960  CC:  Shane Housekeeper, MD, MD    DIAGNOSIS: Multiple myeloma   INTERVAL SINCE LAST RADIATION: Approximately 3 weeks since 8Gy in 1 fraction to the bilateral rib cage and left scapula/shoulder; approximately 1 month since completing 20 Gy in 10 fractions from T11-L5    ALLERGIES: Review of patient's allergies indicates no known allergies.  NARRATIVE: The patient is doing well. He is currently receiving Zometa and [name]. His appetite is excellent. He has gained about 8 pounds. Once he has completed his systemic therapy he may undergo stem cell transplant at [facility] His pain has been alleviated. He is in good spirits.  He does report that both of his hands are shaky. He is not sure why. He has some trouble grasping objects.   MEDICATIONS:  Current Outpatient Prescriptions  Medication Sig Dispense Refill  . acyclovir (ZOVIRAX) 400 MG tablet Take 1 tablet (400 mg total) by mouth daily.  30 tablet  3  . dexamethasone (DECADRON) 4 MG tablet Take 40 mg by mouth every 7 (seven) days. Mondays.      Marland Kitchen docusate sodium (COLACE) 100 MG capsule Take 100 mg by mouth 3 (three) times daily as needed.      . enoxaparin (LOVENOX) 40 MG/0.4ML SOLN Inject 40 mg into the skin daily.      . fentaNYL (DURAGESIC - DOSED MCG/HR) 50 MCG/HR Place 1 patch (50 mcg total) onto the skin every 3 (three) days.  10 patch  0  . lenalidomide (REVLIMID) 15 MG capsule Take 1 capsule (15 mg total) by mouth daily.  21 capsule  0  . ondansetron (ZOFRAN) 8 MG tablet Take 8 mg by mouth every 8 (eight) hours as needed.      Marland Kitchen oxyCODONE-acetaminophen (PERCOCET) 5-325 MG per tablet Take 1 tablet by mouth every 4 (four) hours as needed for pain.  120 tablet  0  . potassium chloride SA (K-DUR,KLOR-CON) 20 MEQ tablet Take 1 tablet (20 mEq total) by mouth 2 (two) times daily.  60 tablet  3  .  prochlorperazine (COMPAZINE) 10 MG tablet Take 1 tablet (10 mg total) by mouth every 6 (six) hours as needed (Nausea or vomiting).  30 tablet  1  . Sennosides 15 MG CHEW Chew 2 each by mouth 2 (two) times daily as needed.      . cyclobenzaprine (FLEXERIL) 10 MG tablet Take 10 mg by mouth 3 (three) times daily as needed.           PHYSICAL EXAM:   weight is 144 lb 4.8 oz (65.454 kg). His temperature is 98.2 F (36.8 C). His blood pressure is 124/82 and his pulse is 87.  He is sitting in a chair in no acute distress. He has no tenderness to palpation throughout the axial or appendicular skeleton. He has some resolving hyperpigmentation in the low back where he received radiotherapy. Hand grasp is 4+ out of 5 bilaterally   LABORATORY DATA:  Lab Results  Component Value Date   WBC 5.0 05/21/2011   HGB 11.2* 05/21/2011   HCT 34.2* 05/21/2011   MCV 82.3 05/21/2011   PLT 191 05/21/2011   Lab Results  Component Value Date   NA 133* 05/14/2011   K 4.9 05/14/2011   CL 105 05/14/2011   CO2 21 05/14/2011   Lab Results  Component Value Date   ALT 20 05/14/2011  AST 22 05/14/2011   ALKPHOS 90 05/14/2011   BILITOT 0.2* 05/14/2011       RADIOGRAPHIC STUDIES:  C-spine MRI from about a month ago was negative for metastases   IMPRESSION/PLAN: Not sure why he has had shaking in his hands. I told him it is not likely to be related to any active cancer in his cervical spine, in light of the C-spine MRI results. I am glad that all of the sites of pain have been palliated since completing radiotherapy. He did very well with that. I will see him back on a when necessary basis.

## 2011-05-28 ENCOUNTER — Ambulatory Visit: Payer: Self-pay

## 2011-05-28 ENCOUNTER — Encounter: Payer: Self-pay | Admitting: Oncology

## 2011-05-28 ENCOUNTER — Ambulatory Visit: Payer: Medicaid Other

## 2011-05-28 NOTE — Progress Notes (Signed)
Patient received three prescriptions from [place] op pharmacy on 05/24/11 $40.60,his remaninig balance [facility] $164.83.

## 2011-05-29 ENCOUNTER — Ambulatory Visit: Payer: Medicaid Other

## 2011-05-29 ENCOUNTER — Encounter: Payer: Self-pay | Admitting: Oncology

## 2011-05-29 NOTE — Progress Notes (Signed)
Patient received three prescriptions from [place] op pharmacy on 05/28/11 $21.22,his remaninig balance [facility] $143.61.

## 2011-05-30 ENCOUNTER — Ambulatory Visit: Payer: Medicaid Other

## 2011-05-31 ENCOUNTER — Telehealth: Payer: Self-pay | Admitting: Oncology

## 2011-05-31 ENCOUNTER — Ambulatory Visit (HOSPITAL_BASED_OUTPATIENT_CLINIC_OR_DEPARTMENT_OTHER): Payer: Medicaid Other | Admitting: Oncology

## 2011-05-31 ENCOUNTER — Ambulatory Visit (HOSPITAL_COMMUNITY): Payer: Self-pay | Admitting: Dentistry

## 2011-05-31 ENCOUNTER — Ambulatory Visit (HOSPITAL_BASED_OUTPATIENT_CLINIC_OR_DEPARTMENT_OTHER): Payer: Medicaid Other | Admitting: Lab

## 2011-05-31 VITALS — BP 109/70 | HR 74 | Temp 97.4°F

## 2011-05-31 DIAGNOSIS — C9000 Multiple myeloma not having achieved remission: Secondary | ICD-10-CM

## 2011-05-31 DIAGNOSIS — G589 Mononeuropathy, unspecified: Secondary | ICD-10-CM

## 2011-05-31 DIAGNOSIS — K08109 Complete loss of teeth, unspecified cause, unspecified class: Secondary | ICD-10-CM

## 2011-05-31 DIAGNOSIS — T451X5A Adverse effect of antineoplastic and immunosuppressive drugs, initial encounter: Secondary | ICD-10-CM

## 2011-05-31 DIAGNOSIS — E876 Hypokalemia: Secondary | ICD-10-CM

## 2011-05-31 DIAGNOSIS — D6481 Anemia due to antineoplastic chemotherapy: Secondary | ICD-10-CM

## 2011-05-31 DIAGNOSIS — Z463 Encounter for fitting and adjustment of dental prosthetic device: Secondary | ICD-10-CM

## 2011-05-31 DIAGNOSIS — D649 Anemia, unspecified: Secondary | ICD-10-CM

## 2011-05-31 LAB — CBC WITH DIFFERENTIAL/PLATELET
Basophils Absolute: 0 10*3/uL (ref 0.0–0.1)
Eosinophils Absolute: 0.2 10*3/uL (ref 0.0–0.5)
HCT: 35.2 % — ABNORMAL LOW (ref 38.4–49.9)
HGB: 11.3 g/dL — ABNORMAL LOW (ref 13.0–17.1)
LYMPH%: 34.4 % (ref 14.0–49.0)
MCV: 85 fL (ref 79.3–98.0)
MONO#: 0.5 10*3/uL (ref 0.1–0.9)
MONO%: 16.2 % — ABNORMAL HIGH (ref 0.0–14.0)
NEUT#: 1.4 10*3/uL — ABNORMAL LOW (ref 1.5–6.5)
Platelets: 204 10*3/uL (ref 140–400)
WBC: 3.4 10*3/uL — ABNORMAL LOW (ref 4.0–10.3)
nRBC: 0 % (ref 0–0)

## 2011-05-31 NOTE — Telephone Encounter (Signed)
gve the pt his April,may 2013 appt calendar 

## 2011-05-31 NOTE — Progress Notes (Signed)
Faxed Rx for Revlimid to [facility].

## 2011-05-31 NOTE — Progress Notes (Signed)
[facility] OFFICE PROGRESS NOTE  Cc:  [name], MD, MD  DIAGNOSIS: IgG lambda multiple myeloma; presented with anemia, lytic bone lesions. Initial M-spike was 5.1gm/dL; free serum lambda of 8.29 mg/dL; Ig G 7760 mg/dL; [id number] of 2.47. Bone marrow biopsy showed 35% plasma cell; cytogenetics per FISH was positive for t(11;14).   CURRENT THERAPY: started on 03/19/11 [name] 1.3mg /m2 SQ d1, 4,8,11 q28 day; Revlimid 25 mg PO d1-21 q28day; Dexamethasone 40mg  PO weekly (even of week off of chemo).  He is also on Acyclovir 400mg  PO BID; Bactrim DS Mon/Wed/Fri; Lovenox 40mg  SQ daily.  Zometa is due to start after he is cleared by dentist.   Chemo has been on hold due to palliative radiation to the lytic bone lesions.   INTERVAL HISTORY: Shane Woodard 51 y.o. male returns for hospital follow up with his congregation nurse, his daughter, and a Systems developer.  He reported that his pain is doing much better.  He has mild to moderate (4/10) flank pain.  He takes Fentanyl patch 4mcg/hr q72hr and needs about 2-3x/day of breakthrough Percocet.  He said that sometimes he feels a little clouded, and at night he experiences occasionally upper extremity spasm without seizure activity.  He has some constipation but has not been taking stool softener regularly.  His fatigue now is much better than two months ago.  He is able to ambulate about daily.  He cooks all meals for his family.  His appetite has significantly improved; and that he has weight gain.   Patient denies headache, visual changes, confusion, drenching night sweats, palpable lymph node swelling, mucositis, odynophagia, dysphagia, nausea vomiting, jaundice, chest pain, palpitation, shortness of breath, dyspnea on exertion, productive cough, gum bleeding, epistaxis, hematemesis, hemoptysis, abdominal pain, abdominal swelling, early satiety, melena, hematochezia, hematuria, skin rash, spontaneous bleeding, joint swelling,  joint pain, heat or cold intolerance, bowel bladder incontinence, focal motor weakness, paresthesia, depression, suicidal or homocidal ideation, feeling hopelessness.    MEDICAL HISTORY: Past Medical History  Diagnosis Date  . Anemia   . Arthritis   . Multiple myeloma   . S/P radiation therapy 05/03/11 - 05/04/11    Right Inferior/Anterior Rib Cage, Left Inferior/Anterior Rib Cage and Left Scapula/Shoulder  . Maintenance antineoplastic chemotherapy     [name], [name]    SURGICAL HISTORY:  Past Surgical History  Procedure Date  . Left thumb surgery 11/26/2003  . Multiple extractions with alveoloplasty 03/28/2011    Procedure: MULTIPLE EXTRACION WITH ALVEOLOPLASTY;  Surgeon: Charlynne Pander, DDS;  Location: [facility];  Service: Oral Surgery;  Laterality: N/A;  Extraction of tooth #'s 2,3,4,5,6,7,8,9,10,11,12,13,14,15,17, 20,21,22,23,24,25,26,27,28, 31, and 32 with alveoloplasty and mandibular left torus reduction.    MEDICATIONS: Current Outpatient Prescriptions  Medication Sig Dispense Refill  . acyclovir (ZOVIRAX) 400 MG tablet Take 1 tablet (400 mg total) by mouth daily.  30 tablet  3  . cyclobenzaprine (FLEXERIL) 10 MG tablet Take 10 mg by mouth 3 (three) times daily as needed.      Marland Kitchen dexamethasone (DECADRON) 4 MG tablet Take 40 mg by mouth every 7 (seven) days. Mondays.      Marland Kitchen docusate sodium (COLACE) 100 MG capsule Take 100 mg by mouth 3 (three) times daily as needed.      . enoxaparin (LOVENOX) 40 MG/0.4ML SOLN Inject 40 mg into the skin daily.      . fentaNYL (DURAGESIC - DOSED MCG/HR) 50 MCG/HR Place 1 patch (50 mcg total) onto the skin every 3 (three) days.  10 patch  0  . lenalidomide (REVLIMID) 15 MG capsule Take 1 capsule (15 mg total) by mouth daily.  21 capsule  0  . ondansetron (ZOFRAN) 8 MG tablet Take 8 mg by mouth every 8 (eight) hours as needed.      Marland Kitchen oxyCODONE-acetaminophen (PERCOCET) 5-325 MG per tablet Take 1 tablet by mouth every 4 (four) hours  as needed for pain.  120 tablet  0  . potassium chloride SA (K-DUR,KLOR-CON) 20 MEQ tablet Take 1 tablet (20 mEq total) by mouth 2 (two) times daily.  60 tablet  3  . prochlorperazine (COMPAZINE) 10 MG tablet Take 1 tablet (10 mg total) by mouth every 6 (six) hours as needed (Nausea or vomiting).  30 tablet  1  . Sennosides 15 MG CHEW Chew 2 each by mouth 2 (two) times daily as needed.        ALLERGIES:   has no known allergies.  REVIEW OF SYSTEMS:  The rest of the 14-point review of system was negative.   Filed Vitals:   05/31/11 1000  BP: 121/70  Pulse: 85  Temp: 98.2 F (36.8 C)   Wt Readings from Last 3 Encounters:  05/31/11 142 lb 3.2 oz (64.501 kg)  05/25/11 144 lb 4.8 oz (65.454 kg)  05/14/11 136 lb 12.8 oz (62.052 kg)   ECOG Performance status: 1  PHYSICAL EXAMINATION:   General: thin-appearing man; no acute distress. Eyes: no scleral icterus. ENT: edentulous without gum bleeding. Neck was without thyromegaly. Lymphatics: Negative cervical, supraclavicular or axillary adenopathy. Respiratory: lungs were clear bilaterally without wheezing or crackles. Cardiovascular: Regular rate and rhythm, S1/S2, without murmur, rub or gallop. There was no pedal edema. GI: abdomen was soft, flat, nontender, nondistended, without organomegaly. Muscoloskeletal:  No tenderness to palpation of lumbar spine.  He no longer had back pain trying to lie down.   There was mild tender to palpation of lower anterior right ribs and right hip.  His gait is now normal without walker.  He was able to get on exam table without assistance.   LABS WBC 3.4; Hgb 11.3; Plt 204; ANC 1.4.  CMET; SPEP pending.   ASSESSMENT AND PLAN:  1. IgG lambda multiple myeloma:  - he has had 2 cycles of Revlimid/[name]/Dex.  His performance status and bone pain have significantly improved.  He is at the tail end of the week off of chemo.  He still has slight neutropenia.  Thus, for this 3rd cycle that is due to start on  06/04/2011, I recommended continuing same dose of chemo as 2nd cycle (which is attenuated compared to the first cycle).   -He is on Acyclovir, Bactrim, Lovenox prophylaxis which I advised him to continue.   - He will receive 2nd dose of monthly Zometa on 06/04/2011.  He has not had complication from this med.   - He saw [facility] BMT on 04/23/2011 to discuss role of BMT after induction chemo and decision was to proceed with auto BMT in the near future.   I will continue to check monthly his response with SPEP and light chain.   2. Compression fracture: s/p kyphoplasty x2 and radiation with now much better pain control. He is on Fentanyl patch 56mcg/hour change 72 hours for long acting. For short acting, Percocet has been working for him. He also has Flexeril for muscle spasm.   He has some bilateral arm spasm and feeling clouded; but he still requires 3x/day breakthrough Percocet.  When he is done with the current  box of Fentanyl (in about 15 days), he wants Fentanyl dose to decrease to 73mcg/hr.   3. Constipation:   I advised him to resume Colace.   4. Anemia/thrombocytopenia:  Due to chemo.  There is no active bleeding; there is no indication for transfusion.   5. Hypokalemia: due to chemo.  He has been on KCl 20 mEq PO BID.  Serum K level is pending today.   6. Mild calorie/protein malnutrition:  He has improved significantly compared to before. His weight is increasing.   7.  Follow up:  He will start cycle # of chemo on 06/04/2011.  We will see him in about 1 month prior to the 4th cycle of chemo.   FULL CODE.    The length of time of the face-to-face encounter was 25 minutes. More than 50% of time was spent counseling and coordination of care.

## 2011-05-31 NOTE — Telephone Encounter (Signed)
S/w luwanna from care management and she is aware that an interpreter is needed for the may 2013 appts

## 2011-05-31 NOTE — Progress Notes (Signed)
Thursday, May 31, 2011   BP:109/70                  P:  45               T: 97.4  Shane Woodard presents for continued upper and lower complete denture fabrication. Procedure: Upper and lower complete denture wax tryin. Patient accepts esthetics, phonetics, and fit. Posteriors with slight opening in molar areas bilaterally.  Discussed with lab and will obtain new bite registration and reset lower molars and then process in 50:50 Luc[place] 199. Patient agrees to process with changes in 50:50 Lucitone 199. Pt. to return to clinic for upper and lower complete denture insertion. Dr. Cindra Eves

## 2011-06-01 ENCOUNTER — Encounter: Payer: Self-pay | Admitting: Oncology

## 2011-06-01 ENCOUNTER — Ambulatory Visit: Payer: Self-pay | Admitting: Radiation Oncology

## 2011-06-01 ENCOUNTER — Encounter: Payer: Self-pay | Admitting: *Deleted

## 2011-06-01 NOTE — Progress Notes (Signed)
Received fax from [facility] that pt's rx Revlimid has been shipped on 05/31/11.  SLJ

## 2011-06-01 NOTE — Progress Notes (Signed)
Patient received one prescription from [place] op pharmacy on 05/31/11 $3.45,his remaninig balance [facility] $140.16

## 2011-06-04 ENCOUNTER — Ambulatory Visit (HOSPITAL_BASED_OUTPATIENT_CLINIC_OR_DEPARTMENT_OTHER): Payer: Medicaid Other

## 2011-06-04 ENCOUNTER — Other Ambulatory Visit: Payer: Self-pay | Admitting: Lab

## 2011-06-04 DIAGNOSIS — Z5112 Encounter for antineoplastic immunotherapy: Secondary | ICD-10-CM

## 2011-06-04 DIAGNOSIS — C9000 Multiple myeloma not having achieved remission: Secondary | ICD-10-CM

## 2011-06-04 LAB — KAPPA/LAMBDA LIGHT CHAINS
Kappa free light chain: 0.13 mg/dL — ABNORMAL LOW (ref 0.33–1.94)
Kappa:Lambda Ratio: 0.16 — ABNORMAL LOW (ref 0.26–1.65)
Lambda Free Lght Chn: 0.82 mg/dL (ref 0.57–2.63)

## 2011-06-04 LAB — COMPREHENSIVE METABOLIC PANEL
ALT: 38 U/L (ref 0–53)
CO2: 25 mEq/L (ref 19–32)
Calcium: 8 mg/dL — ABNORMAL LOW (ref 8.4–10.5)
Chloride: 106 mEq/L (ref 96–112)
Creatinine, Ser: 0.99 mg/dL (ref 0.50–1.35)
Glucose, Bld: 99 mg/dL (ref 70–99)
Total Bilirubin: 0.2 mg/dL — ABNORMAL LOW (ref 0.3–1.2)
Total Protein: 6.9 g/dL (ref 6.0–8.3)

## 2011-06-04 LAB — PROTEIN ELECTROPHORESIS, SERUM
Albumin ELP: 50.9 % — ABNORMAL LOW (ref 55.8–66.1)
Alpha-2-Globulin: 10.3 % (ref 7.1–11.8)
Beta Globulin: 6 % (ref 4.7–7.2)
M-Spike, %: 1.58 g/dL
Total Protein, Serum Electrophoresis: 6.9 g/dL (ref 6.0–8.3)

## 2011-06-04 MED ORDER — SODIUM CHLORIDE 0.9 % IJ SOLN
3.0000 mL | Freq: Once | INTRAMUSCULAR | Status: DC | PRN
  Filled 2011-06-04: qty 10

## 2011-06-04 MED ORDER — ONDANSETRON HCL 8 MG PO TABS
8.0000 mg | ORAL_TABLET | Freq: Once | ORAL | Status: AC
  Administered 2011-06-04: 8 mg via ORAL

## 2011-06-04 MED ORDER — ZOLEDRONIC ACID 4 MG/100ML IV SOLN
4.0000 mg | Freq: Once | INTRAVENOUS | Status: AC
  Administered 2011-06-04: 4 mg via INTRAVENOUS
  Filled 2011-06-04: qty 100

## 2011-06-04 MED ORDER — BORTEZOMIB CHEMO SQ INJECTION 3.5 MG (2.5MG/ML)
1.3000 mg/m2 | Freq: Once | INTRAMUSCULAR | Status: AC
  Administered 2011-06-04: 2.25 mg via SUBCUTANEOUS
  Filled 2011-06-04: qty 2.25

## 2011-06-04 MED ORDER — SODIUM CHLORIDE 0.9 % IV SOLN
Freq: Once | INTRAVENOUS | Status: AC
  Administered 2011-06-04: 10:00:00 via INTRAVENOUS

## 2011-06-04 NOTE — Progress Notes (Signed)
Patient tolerated treatment well with no complaints. Interpretor present at chairside. Patient discharged to home. Ambulating.

## 2011-06-04 NOTE — Patient Instructions (Signed)
[facility] Discharge Instructions for Patients Receiving Chemotherapy  Today you received the following chemotherapy agents [name] and Zometa. To help prevent nausea and vomiting after your treatment, we encourage you to take your nausea medication per MD instructions. Begin taking it as perscribed. If you develop nausea and vomiting that is not controlled by your nausea medication, call the clinic. If it is after clinic hours your family physician or the after hours number for the clinic or go to the Emergency Department.   BELOW ARE SYMPTOMS THAT SHOULD BE REPORTED IMMEDIATELY:  *FEVER GREATER THAN 100.5 F  *CHILLS WITH OR WITHOUT FEVER  NAUSEA AND VOMITING THAT IS NOT CONTROLLED WITH YOUR NAUSEA MEDICATION  *UNUSUAL SHORTNESS OF BREATH  *UNUSUAL BRUISING OR BLEEDING  TENDERNESS IN MOUTH AND THROAT WITH OR WITHOUT PRESENCE OF ULCERS  *URINARY PROBLEMS  *BOWEL PROBLEMS  UNUSUAL RASH Items with * indicate a potential emergency and should be followed up as soon as possible.  One of the nurses will contact you 24 hours after your treatment. Please let the nurse know about any problems that you may have experienced. Feel free to call the clinic you have any questions or concerns. The clinic phone number is [phone].   I have been informed and understand all the instructions given to me. I know to contact the clinic, my physician, or go to the Emergency Department if any problems should occur. I do not have any questions at this time, but understand that I may call the clinic during office hours   should I have any questions or need assistance in obtaining follow up care.    __________________________________________  _____________  __________ Signature of Patient or Authorized Representative            Date                   Time    __________________________________________ Nurse's Signature

## 2011-06-08 ENCOUNTER — Telehealth: Payer: Self-pay | Admitting: *Deleted

## 2011-06-08 ENCOUNTER — Encounter: Payer: Self-pay | Admitting: Oncology

## 2011-06-08 NOTE — Telephone Encounter (Signed)
[facility] RN called to clarify pt's chemo schedule.  Pt has chemo appt scheduled on 4/29 and this should be his week off.  Also no lab appt w/ chemo on 4/15 and 4/22.  POF to scheduling to add labs to chemo appts 4/15 and 4/22 and cancel chemo appt on 4/29.  [facility] RN verbalized understanding and will notify pt of schedule changes.

## 2011-06-08 NOTE — Progress Notes (Signed)
Patient received one prescription from [place] op pharmacy on 06/07/11 $4.80,his remaninig balance [facility] $135.36.

## 2011-06-11 ENCOUNTER — Other Ambulatory Visit (HOSPITAL_BASED_OUTPATIENT_CLINIC_OR_DEPARTMENT_OTHER): Payer: Medicaid Other

## 2011-06-11 ENCOUNTER — Ambulatory Visit (HOSPITAL_BASED_OUTPATIENT_CLINIC_OR_DEPARTMENT_OTHER): Payer: Medicaid Other

## 2011-06-11 DIAGNOSIS — C9000 Multiple myeloma not having achieved remission: Secondary | ICD-10-CM

## 2011-06-11 DIAGNOSIS — Z5112 Encounter for antineoplastic immunotherapy: Secondary | ICD-10-CM

## 2011-06-11 LAB — CBC WITH DIFFERENTIAL/PLATELET
BASO%: 0.9 % (ref 0.0–2.0)
EOS%: 6.4 % (ref 0.0–7.0)
LYMPH%: 35.9 % (ref 14.0–49.0)
MCHC: 31.9 g/dL — ABNORMAL LOW (ref 32.0–36.0)
MCV: 84.3 fL (ref 79.3–98.0)
MONO%: 4.9 % (ref 0.0–14.0)
Platelets: 152 10*3/uL (ref 140–400)
RBC: 4.2 10*6/uL (ref 4.20–5.82)
RDW: 19.5 % — ABNORMAL HIGH (ref 11.0–14.6)
nRBC: 0 % (ref 0–0)

## 2011-06-11 LAB — BASIC METABOLIC PANEL
Calcium: 8.7 mg/dL (ref 8.4–10.5)
Creatinine, Ser: 0.84 mg/dL (ref 0.50–1.35)
Sodium: 135 mEq/L (ref 135–145)

## 2011-06-11 MED ORDER — ONDANSETRON HCL 8 MG PO TABS
8.0000 mg | ORAL_TABLET | Freq: Once | ORAL | Status: AC
  Administered 2011-06-11: 8 mg via ORAL

## 2011-06-11 MED ORDER — BORTEZOMIB CHEMO SQ INJECTION 3.5 MG (2.5MG/ML)
1.3000 mg/m2 | Freq: Once | INTRAMUSCULAR | Status: AC
  Administered 2011-06-11: 2.25 mg via SUBCUTANEOUS
  Filled 2011-06-11: qty 2.25

## 2011-06-11 NOTE — Patient Instructions (Signed)
[facility] Discharge Instructions for Patients Receiving Chemotherapy  Today you received the following chemotherapy agents:  [name]  To help prevent nausea and vomiting after your treatment, we encourage you to take your nausea medication as ordered per MD.    If you develop nausea and vomiting that is not controlled by your nausea medication, call the clinic. If it is after clinic hours your family physician or the after hours number for the clinic or go to the Emergency Department.   BELOW ARE SYMPTOMS THAT SHOULD BE REPORTED IMMEDIATELY:  *FEVER GREATER THAN 100.5 F  *CHILLS WITH OR WITHOUT FEVER  NAUSEA AND VOMITING THAT IS NOT CONTROLLED WITH YOUR NAUSEA MEDICATION  *UNUSUAL SHORTNESS OF BREATH  *UNUSUAL BRUISING OR BLEEDING  TENDERNESS IN MOUTH AND THROAT WITH OR WITHOUT PRESENCE OF ULCERS  *URINARY PROBLEMS  *BOWEL PROBLEMS  UNUSUAL RASH Items with * indicate a potential emergency and should be followed up as soon as possible.   Please let the nurse know about any problems that you may have experienced. Feel free to call the clinic you have any questions or concerns. The clinic phone number is [phone].   I have been informed and understand all the instructions given to me. I know to contact the clinic, my physician, or go to the Emergency Department if any problems should occur. I do not have any questions at this time, but understand that I may call the clinic during office hours   should I have any questions or need assistance in obtaining follow up care.    __________________________________________  _____________  __________ Signature of Patient or Authorized Representative            Date                   Time    __________________________________________ Nurse's Signature    

## 2011-06-13 ENCOUNTER — Other Ambulatory Visit: Payer: Self-pay | Admitting: Oncology

## 2011-06-13 ENCOUNTER — Ambulatory Visit (HOSPITAL_COMMUNITY): Payer: Self-pay | Admitting: Dentistry

## 2011-06-13 ENCOUNTER — Encounter (HOSPITAL_COMMUNITY): Payer: Self-pay | Admitting: Dentistry

## 2011-06-13 VITALS — BP 119/77 | HR 72 | Temp 97.4°F

## 2011-06-13 DIAGNOSIS — K08109 Complete loss of teeth, unspecified cause, unspecified class: Secondary | ICD-10-CM

## 2011-06-13 DIAGNOSIS — Z463 Encounter for fitting and adjustment of dental prosthetic device: Secondary | ICD-10-CM

## 2011-06-13 MED ORDER — OXYCODONE-ACETAMINOPHEN 5-325 MG PO TABS: 1.0000 | ORAL_TABLET | ORAL | Status: DC | PRN

## 2011-06-13 NOTE — Progress Notes (Signed)
Rx for Percocet left with injection nurse; family aware.

## 2011-06-13 NOTE — Patient Instructions (Signed)
Instructions for Denture Use and Care  Congratulations, you are on the way to oral rehabilitation!  You have just received a new set of complete or partial dentures.  These prostheses will help to improve both your appearance and chewing ability.  These instructions will help you get adjusted to your dentures as well as care for them properly.  Please read these instructions carefully and completely as soon as you get home.  If you or your caregiver have any questions please notify the [facility] at [phone].  HOW YOUR DENTURES LOOK AND FEEL Soon after you begin wearing your dentures, you may feel that your dentures are too large or even loose.  As our mouth and facial muscles become accustomed to the dentures, these feelings will go away.  You also may feel that you are salivating more than you normally do.  This feeling should go away as you get used to having the dentures in your mouth.  You may bite your cheek or your tongue; this will eventually resolve itself as you wear your dentures.  Some soreness is to be expected, but you should not hurt.  If your mouth hurts, call your dentist.  A denture adhesive may occasionally be necessary to hold your dentures in place more securely.  The dentist will let you know when one is recommended for you.  SPEAKING Wearing dentures will change the sound of your voice initially.  This will be noticed by you more than anyone else.  Bite and swallow before you speak, in order to place your dentures in position so that you may speak more clearly.  Practice speaking by reading aloud or counting from 1 to 100 very slowly and distinctly.  After some practice your mouth will become accustomed to your dentures and you will speak more clearly.  EATING Chewing will definitely be different after you receive your dentures.  With a little practice and patience you should be able to eat just about any kind of food.  Begin by eating small quantities of food  that are cut into small pieces.  Star with soft foods such as eggs, cooked vegetables, or puddings.  As you gain confidence advance  Your diet to whatever texture foods you can tolerate.  DENTURE CARE Dentures can collect plaque and calculus much the same as natural teeth can.  If not removed on a regular basis, your dentures will not look or feel clean, and you will experience denture odor.  It is very important that you remove your dentures at bedtime and clean them thoroughly.  You should: 1. Clean your dentures over a sink full of water so if dropped, breakage will be prevented. 2. Rinse your dentures with cool water to remove any large food particles. 3. Use soap and water or a denture cleanser or paste to clean the dentures.  Do not use regular toothpaste as it may abrade the denture base or teeth. 4. Use a moistened denture brush to clean all surfaces (inside and outside). 5. Rinse thoroughly to remove any remaining soap or denture cleanser. 6. Use a soft bristle toothbrush to gently brush any natural teeth, gums, tongue, and palate at bedtime and before reinserting your dentures. 7. Do not sleep with your dentures in your mouth at night.  Remove your dentures and soak them overnight in a denture cup filled with water or denture solution as recommended by your dentist.  This routine will become second nature and will increase the life and comfort   of your dentures.  Please do not try to adjust these dentures yourself; you could damage them.  FOLLOW-UP You should call or make an appointment with your dentist.  Your dentist would like to see you at least once a year for a check-up and examination. 

## 2011-06-13 NOTE — Progress Notes (Signed)
Wednesday, June 13, 2011   5BP: 119/77         P:  35               T: 97.4  Shane Woodard presents for insertion of upper and lower complete dentures with Interpreter. Procedure: Pressure indicating paste applied to dentures. Adjustments made as needed. [place]. Occlusion evaluated and adjustments made as needed for centric relation and protrusive strokes. Good esthetics, phonetics, fit, and function noted. Patient accepts results. Postop instructions for use and care of dentures was provided in a written and verbal format.  Gave patient denture brush and cup. Patient to keep dentures out if sore spots develop. Use salt water rinses as needed to aid healing. RTC as scheduled for denture adjustment. Call if problems arise before then. Patient dismissed in stable condition. Dr. Kristin Bruins

## 2011-06-18 ENCOUNTER — Other Ambulatory Visit (HOSPITAL_BASED_OUTPATIENT_CLINIC_OR_DEPARTMENT_OTHER): Payer: Medicaid Other | Admitting: Lab

## 2011-06-18 ENCOUNTER — Other Ambulatory Visit: Payer: Self-pay | Admitting: Oncology

## 2011-06-18 ENCOUNTER — Ambulatory Visit: Payer: Medicaid Other | Admitting: Lab

## 2011-06-18 ENCOUNTER — Ambulatory Visit: Payer: No Typology Code available for payment source

## 2011-06-18 ENCOUNTER — Ambulatory Visit (HOSPITAL_BASED_OUTPATIENT_CLINIC_OR_DEPARTMENT_OTHER): Payer: Medicaid Other

## 2011-06-18 DIAGNOSIS — C9000 Multiple myeloma not having achieved remission: Secondary | ICD-10-CM

## 2011-06-18 DIAGNOSIS — Z5112 Encounter for antineoplastic immunotherapy: Secondary | ICD-10-CM

## 2011-06-18 LAB — CBC WITH DIFFERENTIAL/PLATELET
BASO%: 0.6 % (ref 0.0–2.0)
EOS%: 6.3 % (ref 0.0–7.0)
MCH: 27.1 pg — ABNORMAL LOW (ref 27.2–33.4)
MCV: 86.1 fL (ref 79.3–98.0)
MONO%: 11.8 % (ref 0.0–14.0)
RBC: 4.39 10*6/uL (ref 4.20–5.82)
RDW: 18.8 % — ABNORMAL HIGH (ref 11.0–14.6)
nRBC: 0 % (ref 0–0)

## 2011-06-18 LAB — BASIC METABOLIC PANEL
Calcium: 9.6 mg/dL (ref 8.4–10.5)
Glucose, Bld: 81 mg/dL (ref 70–99)
Sodium: 138 mEq/L (ref 135–145)

## 2011-06-18 LAB — TECHNOLOGIST REVIEW

## 2011-06-18 MED ORDER — BORTEZOMIB CHEMO SQ INJECTION 3.5 MG (2.5MG/ML)
1.3000 mg/m2 | Freq: Once | INTRAMUSCULAR | Status: AC
  Administered 2011-06-18: 2.25 mg via SUBCUTANEOUS
  Filled 2011-06-18: qty 2.25

## 2011-06-18 MED ORDER — ONDANSETRON HCL 8 MG PO TABS
8.0000 mg | ORAL_TABLET | Freq: Once | ORAL | Status: AC
  Administered 2011-06-18: 8 mg via ORAL

## 2011-06-19 ENCOUNTER — Ambulatory Visit (HOSPITAL_COMMUNITY): Payer: Self-pay | Admitting: Dentistry

## 2011-06-19 VITALS — BP 162/91 | HR 95 | Temp 97.6°F

## 2011-06-19 DIAGNOSIS — K1370 Unspecified lesions of oral mucosa: Secondary | ICD-10-CM

## 2011-06-19 DIAGNOSIS — Z463 Encounter for fitting and adjustment of dental prosthetic device: Secondary | ICD-10-CM

## 2011-06-19 DIAGNOSIS — K062 Gingival and edentulous alveolar ridge lesions associated with trauma: Secondary | ICD-10-CM

## 2011-06-19 NOTE — Progress Notes (Signed)
Tuesday, June 19, 2011   BP: 162/91             P:  95              T: 97.6  Shane Woodard presents for adjustment of recently inserted upper and lower complete dentures with Interpreter. Subjective: Patient is complaining of denture irritation to the maxillary and mandibular anterior alveolar ridge area. Exam: There is no evidence of denture irritation or erythema. Procedure: Pressure indicating paste applied to dentures. Adjustments made as needed. [place]. Occlusion evaluated and adjustments made as needed for centric relation. Patient still having problems with finding maximum intercuspation position. Patient was shown with instructions from interpreter on how to find the maximum intercuspation position. Patient expresses understanding and is able to find centric relation bite at this time. Patient accepts results. Patient to keep dentures out if sore spots develop. Use salt water rinses as needed to aid healing. RTC as scheduled for denture adjustment. Call if problems arise before then. Patient dismissed in stable condition.   Dr. Kristin Bruins

## 2011-06-21 ENCOUNTER — Encounter: Payer: Self-pay | Admitting: Oncology

## 2011-06-21 ENCOUNTER — Other Ambulatory Visit: Payer: Self-pay | Admitting: *Deleted

## 2011-06-21 NOTE — Telephone Encounter (Signed)
THIS REQUEST FOR REVLIMID WAS PLACED IN DR.HA'S BLUE ACTIVE WORK FOLDER.

## 2011-06-21 NOTE — Progress Notes (Signed)
Patient received two prescriptions from [place] op pharmacy on 06/20/11 $7.80,his remaninig balance [facility] $127.56.

## 2011-06-22 ENCOUNTER — Other Ambulatory Visit: Payer: Self-pay | Admitting: Oncology

## 2011-06-25 ENCOUNTER — Ambulatory Visit: Payer: Self-pay

## 2011-06-28 ENCOUNTER — Other Ambulatory Visit (HOSPITAL_BASED_OUTPATIENT_CLINIC_OR_DEPARTMENT_OTHER): Payer: Medicaid Other | Admitting: Lab

## 2011-06-28 ENCOUNTER — Ambulatory Visit (HOSPITAL_BASED_OUTPATIENT_CLINIC_OR_DEPARTMENT_OTHER): Payer: Medicaid Other | Admitting: Oncology

## 2011-06-28 ENCOUNTER — Telehealth: Payer: Self-pay | Admitting: Oncology

## 2011-06-28 DIAGNOSIS — C9000 Multiple myeloma not having achieved remission: Secondary | ICD-10-CM

## 2011-06-28 DIAGNOSIS — M898X9 Other specified disorders of bone, unspecified site: Secondary | ICD-10-CM

## 2011-06-28 DIAGNOSIS — D6481 Anemia due to antineoplastic chemotherapy: Secondary | ICD-10-CM

## 2011-06-28 DIAGNOSIS — D702 Other drug-induced agranulocytosis: Secondary | ICD-10-CM

## 2011-06-28 DIAGNOSIS — D6959 Other secondary thrombocytopenia: Secondary | ICD-10-CM

## 2011-06-28 LAB — CBC WITH DIFFERENTIAL/PLATELET
Basophils Absolute: 0 10*3/uL (ref 0.0–0.1)
EOS%: 6.8 % (ref 0.0–7.0)
HGB: 11.6 g/dL — ABNORMAL LOW (ref 13.0–17.1)
MCH: 27.2 pg (ref 27.2–33.4)
NEUT#: 1.4 10*3/uL — ABNORMAL LOW (ref 1.5–6.5)
RDW: 19.5 % — ABNORMAL HIGH (ref 11.0–14.6)
WBC: 3.1 10*3/uL — ABNORMAL LOW (ref 4.0–10.3)
lymph#: 1 10*3/uL (ref 0.9–3.3)

## 2011-06-28 MED ORDER — FENTANYL 25 MCG/HR TD PT72: 1.0000 | MEDICATED_PATCH | TRANSDERMAL | Status: DC

## 2011-06-28 MED ORDER — LENALIDOMIDE 15 MG PO CAPS: 15.0000 mg | ORAL_CAPSULE | Freq: Every day | ORAL | Status: DC

## 2011-06-28 NOTE — Telephone Encounter (Signed)
Gv pt appt for [id number] 

## 2011-06-28 NOTE — Telephone Encounter (Signed)
RECEIVED A FAX FROM [employer] CONCERNING A CONFIRMATION OF FACSIMILE RECEIPT FOR THE [facility] [facility]IENT [facility]PPORT [facility].

## 2011-06-28 NOTE — Progress Notes (Signed)
[facility]  Telephone:([phone]) [phone] [phone]   OFFICE PROGRESS NOTE   Cc:  [name], MD, MD  DIAGNOSIS: IgG lambda multiple myeloma; presented with anemia, lytic bone lesions. Initial M-spike was 5.1gm/dL; free serum lambda of 1.47 mg/dL; Ig G 7760 mg/dL; [id number] of 2.47. Bone marrow biopsy showed 35% plasma cell; cytogenetics per FISH was positive for t(11;14).   CURRENT THERAPY: started on 03/19/11 [name] 1.3mg /m2 SQ d1, 4,8,11 q28 day; Revlimid 25 mg PO d1-21 q28day; Dexamethasone 40mg  PO weekly (even of week off of chemo). He is also on Acyclovir 400mg  PO BID; Bactrim DS Mon/Wed/Fri; Lovenox 40mg  SQ daily. He is also on qmonth Zometa for bone protection.   INTERVAL HISTORY: Shane Woodard 51 y.o. male returns for regular follow up with his congregation nurse and a Systems developer.  He still has moderate left flank pain.  Pain is crampy; moderate 6/10 with certain movement of the body; relieved with rest; does not radiate; not associated with leg weakness.  He is taking Fentanyl patch 65mcg/hr change every 3 days.  He only takes about 1-2 times daily of Percocet for break through pain.  The pain is much improved compared to when he was diagnosed.  He is more active now and is able to move around the house.  He has good appetite and has regained almost all of his lost weight.  He denies fever, mucositis, nausea/vomiting, skin rash, leg swelling/cramp, abdominal pain, bleeding symptoms, neuropathy, paresthesia.     Past Medical History  Diagnosis Date  . Anemia   . Arthritis   . Multiple myeloma   . S/P radiation therapy 05/03/11 - 05/04/11    Right Inferior/Anterior Rib Cage, Left Inferior/Anterior Rib Cage and Left Scapula/Shoulder  . Maintenance antineoplastic chemotherapy     [name], [name]    Past Surgical History  Procedure Date  . Left thumb surgery 11/26/2003  . Multiple extractions with alveoloplasty 03/28/2011   Procedure: MULTIPLE EXTRACION WITH ALVEOLOPLASTY;  Surgeon: Charlynne Pander, DDS;  Location: [facility];  Service: Oral Surgery;  Laterality: N/A;  Extraction of tooth #'s 2,3,4,5,6,7,8,9,10,11,12,13,14,15,17, 20,21,22,23,24,25,26,27,28, 31, and 32 with alveoloplasty and mandibular left torus reduction.    Current Outpatient Prescriptions  Medication Sig Dispense Refill  . acyclovir (ZOVIRAX) 400 MG tablet Take 1 tablet (400 mg total) by mouth daily.  30 tablet  3  . cyclobenzaprine (FLEXERIL) 10 MG tablet Take 10 mg by mouth 3 (three) times daily as needed.      Marland Kitchen dexamethasone (DECADRON) 4 MG tablet Take 40 mg by mouth every 7 (seven) days. Mondays.      Marland Kitchen enoxaparin (LOVENOX) 40 MG/0.4ML SOLN Inject 40 mg into the skin daily.      Marland Kitchen lenalidomide (REVLIMID) 15 MG capsule Take 1 capsule (15 mg total) by mouth daily.  21 capsule  0  . ondansetron (ZOFRAN) 8 MG tablet Take 8 mg by mouth every 8 (eight) hours as needed.      Marland Kitchen oxyCODONE-acetaminophen (PERCOCET) 5-325 MG per tablet Take 1 tablet by mouth every 4 (four) hours as needed for pain.  120 tablet  0  . potassium chloride SA (K-DUR,KLOR-CON) 20 MEQ tablet Take 20 mEq by mouth 2 (two) times daily.      Marland Kitchen sulfamethoxazole-trimethoprim (BACTRIM DS,SEPTRA DS) 800-160 MG per tablet Take 1 tablet by mouth 3 (three) times a week.      . docusate sodium (COLACE) 100 MG capsule Take 100 mg by mouth 3 (three) times daily as  needed.      . fentaNYL (DURAGESIC - DOSED MCG/HR) 25 MCG/HR Place 1 patch (25 mcg total) onto the skin every 3 (three) days.  10 patch  0  . prochlorperazine (COMPAZINE) 10 MG tablet Take 1 tablet (10 mg total) by mouth every 6 (six) hours as needed (Nausea or vomiting).  30 tablet  1  . Sennosides 15 MG CHEW Chew 2 each by mouth 2 (two) times daily as needed.        ALLERGIES:   has no known allergies.  REVIEW OF SYSTEMS:  The rest of the 14-point review of system was negative.   Filed Vitals:   06/28/11 1326  BP: 125/80    Pulse: 92  Temp: 98 F (36.7 C)   Wt Readings from Last 3 Encounters:  06/28/11 150 lb 1.6 oz (68.085 kg)  05/31/11 142 lb 3.2 oz (64.501 kg)  05/25/11 144 lb 4.8 oz (65.454 kg)   ECOG Performance status: 1  PHYSICAL EXAMINATION:   General:  well-nourished man, in no acute distress.  Eyes:  no scleral icterus.  ENT:  There were no oropharyngeal lesions.  Neck was without thyromegaly.  Lymphatics:  Negative cervical, supraclavicular or axillary adenopathy.  Respiratory: lungs were clear bilaterally without wheezing or crackles.  Cardiovascular:  Regular rate and rhythm, S1/S2, without murmur, rub or gallop.  There was no pedal edema.  GI:  abdomen was soft, flat, nontender, nondistended, without organomegaly.  Muscoloskeletal:  no spinal tenderness of palpation of vertebral spine. There was some discomfort in palpation of the left lower posterior ribs without palpable mass. Skin exam was without echymosis, petichae.  Neuro exam was nonfocal.  Patient was able to get on and off exam table without assistance.  Gait was normal.  Patient was alerted and oriented.  Attention was good.   Language was appropriate.  Mood was normal without depression.  Speech was not pressured.  Thought content was not tangential.     LABORATORY/RADIOLOGY DATA:  Lab Results  Component Value Date   WBC 3.1* 06/28/2011   HGB 11.6* 06/28/2011   HCT 37.0* 06/28/2011   PLT 213 06/28/2011   GLUCOSE 81 06/18/2011   ALKPHOS 86 05/31/2011   ALT 38 05/31/2011   AST 21 05/31/2011   NA 138 06/18/2011   K 4.1 06/18/2011   CL 103 06/18/2011   CREATININE 0.96 06/18/2011   BUN 9 06/18/2011   CO2 27 06/18/2011   INR 1.09 04/10/2011    ASSESSMENT AND PLAN:     1. IgG lambda multiple myeloma:  - he has had 3 cycles of Revlimid/[name]/Dex. His performance status continues to improves.  He has responded to improvement of his M-spike.  He still has slight neutropenia.  Therefore, I recommended continuing same dose of chemo as 2nd cycle  (which is attenuated compared to the first cycle) ([name] still at 1.3mg /m2; and Revlimid at 15mg ) -He is on Acyclovir, Bactrim, Lovenox prophylaxis which I advised him to continue.  - He will receive 3nd dose of monthly Zometa on 07/02/2011. He has not had complication from this med.  - He saw [facility] on 04/23/2011 to discuss role of BMT after induction chemo and decision was to proceed with auto BMT in the near future. I will continue to check monthly his response with SPEP and light chain and will refer back to [facility] after 4-6 months of induction chemo or sooner if he has negative M-spike.   2. Compression fracture: s/p kyphoplasty x2 and radiation with  now much better pain control.  Despite moderate flank pain, he would like to decrease Fentanyl dose to 63mcg/hr to prevent sedation.  I advised him to continue Percocet prn q6hr breakthrough pain.    3. Constipation: I advised him to continue Colace.   4. Anemia/thrombocytopenia: Due to chemo. There is no active bleeding; there is no indication for transfusion.   5. Hypokalemia: due to chemo. He has been on KCl 20 mEq PO BID. I advised him to continue KCl.   6. Mild calorie/protein malnutrition: He has improved significantly compared to before. His weight is increasing.   7. Birth control:  Mr. Liuzzi inquired about intercourse with his wife.  I strongly advised him to use condom to prevent his wife from getting pregnant which can cause birth defect being on chemo.  8. Follow up: He will start cycle # 4 of chemo on 07/02/2011. We will see him in about 1 month prior to the 5th cycle of chemo.     The length of time of the face-to-face encounter was 25 minutes. More than 50% of time was spent counseling and coordination of care.

## 2011-06-29 ENCOUNTER — Other Ambulatory Visit: Payer: Self-pay | Admitting: *Deleted

## 2011-06-29 MED ORDER — ENOXAPARIN SODIUM 40 MG/0.4ML ~~LOC~~ SOLN: 40.0000 mg | SUBCUTANEOUS | Status: DC

## 2011-06-29 NOTE — Telephone Encounter (Signed)
RECEIVED A FAX FROM [employer] CONCERNING A CONFIRMATION OF PRESCRIPTION SHIPMENT FOR REVLIMID. 

## 2011-06-29 NOTE — Telephone Encounter (Signed)
Rx for Lovenox given to [facility] in managed care dept for prior auth and possible co- pay assistance.  Pt was given seven lovenox 40 mg syringes from our pharmacy yesterday.

## 2011-07-02 ENCOUNTER — Ambulatory Visit (HOSPITAL_BASED_OUTPATIENT_CLINIC_OR_DEPARTMENT_OTHER): Payer: Medicaid Other

## 2011-07-02 ENCOUNTER — Encounter: Payer: Self-pay | Admitting: Oncology

## 2011-07-02 ENCOUNTER — Other Ambulatory Visit: Payer: Self-pay | Admitting: Radiation Oncology

## 2011-07-02 DIAGNOSIS — Z5112 Encounter for antineoplastic immunotherapy: Secondary | ICD-10-CM

## 2011-07-02 DIAGNOSIS — C9000 Multiple myeloma not having achieved remission: Secondary | ICD-10-CM

## 2011-07-02 MED ORDER — ONDANSETRON HCL 8 MG PO TABS
8.0000 mg | ORAL_TABLET | Freq: Once | ORAL | Status: AC
  Administered 2011-07-02: 8 mg via ORAL

## 2011-07-02 MED ORDER — ZOLEDRONIC ACID 4 MG/100ML IV SOLN
4.0000 mg | Freq: Once | INTRAVENOUS | Status: AC
  Administered 2011-07-02: 4 mg via INTRAVENOUS
  Filled 2011-07-02: qty 100

## 2011-07-02 MED ORDER — BORTEZOMIB CHEMO SQ INJECTION 3.5 MG (2.5MG/ML)
1.3000 mg/m2 | Freq: Once | INTRAMUSCULAR | Status: AC
  Administered 2011-07-02: 2.25 mg via SUBCUTANEOUS
  Filled 2011-07-02: qty 2.25

## 2011-07-02 NOTE — Progress Notes (Signed)
Patient received three prescriptions from  op pharmacy on 06/28/11 $36.63,his remaninig balance [facility] $90.93.

## 2011-07-02 NOTE — Patient Instructions (Signed)
[facility] Discharge Instructions for Patients Receiving Chemotherapy  Today you received the following chemotherapy agents [name].  To help prevent nausea and vomiting after your treatment, we encourage you to take your nausea medication.   If you develop nausea and vomiting that is not controlled by your nausea medication, call the clinic. If it is after clinic hours your family physician or the after hours number for the clinic or go to the Emergency Department.   BELOW ARE SYMPTOMS THAT SHOULD BE REPORTED IMMEDIATELY:  *FEVER GREATER THAN 100.5 F  *CHILLS WITH OR WITHOUT FEVER  NAUSEA AND VOMITING THAT IS NOT CONTROLLED WITH YOUR NAUSEA MEDICATION  *UNUSUAL SHORTNESS OF BREATH  *UNUSUAL BRUISING OR BLEEDING  TENDERNESS IN MOUTH AND THROAT WITH OR WITHOUT PRESENCE OF ULCERS  *URINARY PROBLEMS  *BOWEL PROBLEMS  UNUSUAL RASH Items with * indicate a potential emergency and should be followed up as soon as possible.  One of the nurses will contact you 24 hours after your treatment. Please let the nurse know about any problems that you may have experienced. Feel free to call the clinic you have any questions or concerns. The clinic phone number is [phone].   I have been informed and understand all the instructions given to me. I know to contact the clinic, my physician, or go to the Emergency Department if any problems should occur. I do not have any questions at this time, but understand that I may call the clinic during office hours   should I have any questions or need assistance in obtaining follow up care.    __________________________________________  _____________  __________ Signature of Patient or Authorized Representative            Date                   Time    __________________________________________ Nurse's Signature    

## 2011-07-03 LAB — COMPREHENSIVE METABOLIC PANEL
ALT: 44 U/L (ref 0–53)
AST: 19 U/L (ref 0–37)
Albumin: 3.4 g/dL — ABNORMAL LOW (ref 3.5–5.2)
BUN: 14 mg/dL (ref 6–23)
Calcium: 8.5 mg/dL (ref 8.4–10.5)
Chloride: 109 mEq/L (ref 96–112)
Potassium: 3.5 mEq/L (ref 3.5–5.3)

## 2011-07-03 LAB — KAPPA/LAMBDA LIGHT CHAINS
Kappa:Lambda Ratio: 0.17 — ABNORMAL LOW (ref 0.26–1.65)
Lambda Free Lght Chn: 1.2 mg/dL (ref 0.57–2.63)

## 2011-07-03 LAB — PROTEIN ELECTROPHORESIS, SERUM
Albumin ELP: 56.5 % (ref 55.8–66.1)
Alpha-1-Globulin: 4.2 % (ref 2.9–4.9)
Alpha-2-Globulin: 10.3 % (ref 7.1–11.8)
Beta 2: 2.2 % — ABNORMAL LOW (ref 3.2–6.5)
Beta Globulin: 7.1 % (ref 4.7–7.2)
Total Protein, Serum Electrophoresis: 6.1 g/dL (ref 6.0–8.3)

## 2011-07-04 ENCOUNTER — Encounter: Payer: Self-pay | Admitting: Oncology

## 2011-07-04 NOTE — Progress Notes (Signed)
Patient received one prescription from [place] op pharmacy on 07/03/11 $3.45,his remaninig balance [facility] $87.48.

## 2011-07-05 ENCOUNTER — Encounter: Payer: Self-pay | Admitting: Oncology

## 2011-07-05 NOTE — Progress Notes (Signed)
Patient now has medicade to fill prescriptions at [facility].

## 2011-07-05 NOTE — Progress Notes (Signed)
Patient has medicaid for insurance, has been verified that it is active, patient can use out patient pharmacy for lovenox, patient needs to take card when script is being filled. I did let nurse Dorene Grebe know on 07/05/11

## 2011-07-09 ENCOUNTER — Other Ambulatory Visit: Payer: Self-pay | Admitting: *Deleted

## 2011-07-09 ENCOUNTER — Ambulatory Visit (HOSPITAL_BASED_OUTPATIENT_CLINIC_OR_DEPARTMENT_OTHER): Payer: Medicaid Other

## 2011-07-09 ENCOUNTER — Other Ambulatory Visit (HOSPITAL_BASED_OUTPATIENT_CLINIC_OR_DEPARTMENT_OTHER): Payer: Medicaid Other

## 2011-07-09 DIAGNOSIS — C9000 Multiple myeloma not having achieved remission: Secondary | ICD-10-CM

## 2011-07-09 DIAGNOSIS — M898X9 Other specified disorders of bone, unspecified site: Secondary | ICD-10-CM

## 2011-07-09 DIAGNOSIS — D649 Anemia, unspecified: Secondary | ICD-10-CM

## 2011-07-09 DIAGNOSIS — Z5112 Encounter for antineoplastic immunotherapy: Secondary | ICD-10-CM

## 2011-07-09 LAB — CBC WITH DIFFERENTIAL/PLATELET
EOS%: 3.2 % (ref 0.0–7.0)
LYMPH%: 18.3 % (ref 14.0–49.0)
MCH: 28.9 pg (ref 27.2–33.4)
MCHC: 33.1 g/dL (ref 32.0–36.0)
MCV: 87.4 fL (ref 79.3–98.0)
MONO%: 4.2 % (ref 0.0–14.0)
RBC: 4.37 10*6/uL (ref 4.20–5.82)
RDW: 19 % — ABNORMAL HIGH (ref 11.0–14.6)
nRBC: 0 % (ref 0–0)

## 2011-07-09 LAB — BASIC METABOLIC PANEL
Calcium: 8.9 mg/dL (ref 8.4–10.5)
Creatinine, Ser: 0.96 mg/dL (ref 0.50–1.35)
Sodium: 140 mEq/L (ref 135–145)

## 2011-07-09 MED ORDER — BORTEZOMIB CHEMO SQ INJECTION 3.5 MG (2.5MG/ML)
1.3000 mg/m2 | Freq: Once | INTRAMUSCULAR | Status: AC
  Administered 2011-07-09: 2.25 mg via SUBCUTANEOUS
  Filled 2011-07-09: qty 2.25

## 2011-07-09 MED ORDER — ENOXAPARIN SODIUM 40 MG/0.4ML ~~LOC~~ SOLN: 40.0000 mg | SUBCUTANEOUS | Status: DC

## 2011-07-09 MED ORDER — ONDANSETRON HCL 8 MG PO TABS
8.0000 mg | ORAL_TABLET | Freq: Once | ORAL | Status: AC
  Administered 2011-07-09: 8 mg via ORAL

## 2011-07-09 NOTE — Telephone Encounter (Signed)
Pt in clinic and states he has 2 Lovenox injections left.  Per Alcario Drought in Managed Care,  Pt can get lovenox filled at [facility] outpt pharmacy w/ his Medicaid card.  Called [facility] pharm and s/w Meredeth Ide who confirmed they can accept Medicaid from pt for lovenox.  Sent Rx electronically and instructed pt to bring his medicaid card w/ him to pharmacy.  He verbalized understanding.

## 2011-07-09 NOTE — Patient Instructions (Signed)
Unable to print avs, call md for problems

## 2011-07-10 ENCOUNTER — Ambulatory Visit (HOSPITAL_COMMUNITY): Payer: Medicaid - Dental | Admitting: Dentistry

## 2011-07-10 ENCOUNTER — Other Ambulatory Visit: Payer: Self-pay | Admitting: Oncology

## 2011-07-10 VITALS — BP 138/85 | HR 82 | Temp 97.5°F

## 2011-07-10 DIAGNOSIS — K062 Gingival and edentulous alveolar ridge lesions associated with trauma: Secondary | ICD-10-CM

## 2011-07-10 DIAGNOSIS — K1370 Unspecified lesions of oral mucosa: Secondary | ICD-10-CM

## 2011-07-10 DIAGNOSIS — Z463 Encounter for fitting and adjustment of dental prosthetic device: Secondary | ICD-10-CM

## 2011-07-10 MED ORDER — OXYCODONE-ACETAMINOPHEN 5-325 MG PO TABS: 1.0000 | ORAL_TABLET | ORAL | Status: DC | PRN

## 2011-07-10 NOTE — Progress Notes (Signed)
Tuesday, Jul 10, 2011   BP: 138/85            P: 82           T: 97.5  Shane Woodard presents for adjustment of recently inserted upper and lower complete dentures.  Subjective: Patient is complaining of denture irritation to the maxillary and mandibular anterior alveolar ridge areas. Exam: There is no evidence of denture irritation or erythema. Procedure: Pressure indicating paste applied to dentures. Adjustments made as needed. [place]. Occlusion evaluated and adjustments made as needed for centric relation. Patient having NO problems with finding maximum intercuspation position today. Patient accepts results. Patient to keep dentures out if sore spots develop. Use salt water rinses as needed to aid healing. RTC as scheduled for denture adjustment. Call if problems arise before then. Patient dismissed in stable condition. Dr. Cindra Eves

## 2011-07-11 ENCOUNTER — Encounter: Payer: Self-pay | Admitting: Oncology

## 2011-07-11 NOTE — Progress Notes (Signed)
Patient received four prescriptions from [place] op pharmacy on 07/10/11 $12.00,his remaninig balance [facility] $75.48.

## 2011-07-16 ENCOUNTER — Other Ambulatory Visit (HOSPITAL_BASED_OUTPATIENT_CLINIC_OR_DEPARTMENT_OTHER): Payer: Medicaid Other | Admitting: Lab

## 2011-07-16 ENCOUNTER — Ambulatory Visit: Payer: No Typology Code available for payment source

## 2011-07-16 ENCOUNTER — Ambulatory Visit (HOSPITAL_BASED_OUTPATIENT_CLINIC_OR_DEPARTMENT_OTHER): Payer: Medicaid Other

## 2011-07-16 DIAGNOSIS — C9000 Multiple myeloma not having achieved remission: Secondary | ICD-10-CM

## 2011-07-16 DIAGNOSIS — M898X9 Other specified disorders of bone, unspecified site: Secondary | ICD-10-CM

## 2011-07-16 DIAGNOSIS — Z5112 Encounter for antineoplastic immunotherapy: Secondary | ICD-10-CM

## 2011-07-16 LAB — CBC WITH DIFFERENTIAL/PLATELET
Eosinophils Absolute: 0.1 10*3/uL (ref 0.0–0.5)
HCT: 34.2 % — ABNORMAL LOW (ref 38.4–49.9)
LYMPH%: 12.3 % — ABNORMAL LOW (ref 14.0–49.0)
MONO#: 0.3 10*3/uL (ref 0.1–0.9)
NEUT#: 4.6 10*3/uL (ref 1.5–6.5)
NEUT%: 80.9 % — ABNORMAL HIGH (ref 39.0–75.0)
Platelets: 140 10*3/uL (ref 140–400)
WBC: 5.6 10*3/uL (ref 4.0–10.3)
nRBC: 0 % (ref 0–0)

## 2011-07-16 LAB — BASIC METABOLIC PANEL
CO2: 22 mEq/L (ref 19–32)
Chloride: 108 mEq/L (ref 96–112)
Creatinine, Ser: 0.87 mg/dL (ref 0.50–1.35)

## 2011-07-16 MED ORDER — BORTEZOMIB CHEMO SQ INJECTION 3.5 MG (2.5MG/ML)
1.3000 mg/m2 | Freq: Once | INTRAMUSCULAR | Status: AC
  Administered 2011-07-16: 2.25 mg via SUBCUTANEOUS
  Filled 2011-07-16: qty 2.25

## 2011-07-16 MED ORDER — ONDANSETRON HCL 8 MG PO TABS
8.0000 mg | ORAL_TABLET | Freq: Once | ORAL | Status: AC
  Administered 2011-07-16: 8 mg via ORAL

## 2011-07-17 ENCOUNTER — Other Ambulatory Visit: Payer: Self-pay | Admitting: Certified Registered Nurse Anesthetist

## 2011-07-19 ENCOUNTER — Encounter: Payer: Self-pay | Admitting: *Deleted

## 2011-07-19 ENCOUNTER — Other Ambulatory Visit: Payer: Self-pay | Admitting: Oncology

## 2011-07-19 ENCOUNTER — Other Ambulatory Visit: Payer: Self-pay | Admitting: *Deleted

## 2011-07-19 MED ORDER — LENALIDOMIDE 15 MG PO CAPS: 15.0000 mg | ORAL_CAPSULE | Freq: Every day | ORAL | Status: DC

## 2011-07-19 NOTE — Progress Notes (Signed)
Received confirmation fax from [employer] [facility] [employer] [facility] that referral for Revlimid was received.

## 2011-07-24 ENCOUNTER — Encounter: Payer: Self-pay | Admitting: Oncology

## 2011-07-24 NOTE — Progress Notes (Signed)
Patient received one prescription from [place] op pharmacy on 07/20/11 $3.00,his remaninig balance [facility] $72.48.

## 2011-07-25 ENCOUNTER — Encounter (HOSPITAL_COMMUNITY): Payer: Self-pay | Admitting: Dentistry

## 2011-07-25 ENCOUNTER — Ambulatory Visit (HOSPITAL_COMMUNITY): Payer: Medicaid - Dental | Admitting: Dentistry

## 2011-07-25 ENCOUNTER — Other Ambulatory Visit: Payer: Self-pay | Admitting: Oncology

## 2011-07-25 VITALS — BP 129/76 | HR 86 | Temp 98.6°F

## 2011-07-25 DIAGNOSIS — K062 Gingival and edentulous alveolar ridge lesions associated with trauma: Secondary | ICD-10-CM

## 2011-07-25 DIAGNOSIS — K1370 Unspecified lesions of oral mucosa: Secondary | ICD-10-CM

## 2011-07-25 DIAGNOSIS — M898X9 Other specified disorders of bone, unspecified site: Secondary | ICD-10-CM

## 2011-07-25 DIAGNOSIS — Z463 Encounter for fitting and adjustment of dental prosthetic device: Secondary | ICD-10-CM

## 2011-07-25 MED ORDER — FENTANYL 25 MCG/HR TD PT72: 1.0000 | MEDICATED_PATCH | TRANSDERMAL | Status: DC

## 2011-07-25 MED ORDER — SULFAMETHOXAZOLE-TRIMETHOPRIM 800-160 MG PO TABS: 1.0000 | ORAL_TABLET | ORAL | Status: DC

## 2011-07-25 NOTE — Progress Notes (Signed)
Wednesday, Jul 25, 2011   BP: 129/76          P:  86        T:  98.6  Shane Woodard presents with interpreter for adjustment of upper and lower complete dentures. Subjective: Patient is complaining of denture irritation to the mandibular anterior alveolar ridge area only. Exam: There is no evidence of denture irritation or erythema. Procedure: Pressure indicating paste applied to dentures. Adjustments made as needed. [place]. Occlusion evaluated and adjustments made as needed for centric relation and protrusive stokes. Patient having NO problems with finding maximum intercuspation position today. Patient accepts results. Patient to keep dentures out if sore spots develop. Use salt water rinses as needed to aid healing. RTC as scheduled for denture adjustment. Call if problems arise before then. Patient dismissed in stable condition. Dr. Cindra Eves

## 2011-07-26 ENCOUNTER — Other Ambulatory Visit (HOSPITAL_BASED_OUTPATIENT_CLINIC_OR_DEPARTMENT_OTHER): Payer: Medicaid Other | Admitting: Lab

## 2011-07-26 ENCOUNTER — Telehealth: Payer: Self-pay | Admitting: *Deleted

## 2011-07-26 ENCOUNTER — Ambulatory Visit (HOSPITAL_BASED_OUTPATIENT_CLINIC_OR_DEPARTMENT_OTHER): Payer: Medicaid Other | Admitting: Oncology

## 2011-07-26 ENCOUNTER — Encounter: Payer: Self-pay | Admitting: Oncology

## 2011-07-26 VITALS — BP 128/80 | HR 86 | Temp 97.0°F | Ht 65.0 in | Wt 156.0 lb

## 2011-07-26 DIAGNOSIS — C9000 Multiple myeloma not having achieved remission: Secondary | ICD-10-CM | POA: Insufficient documentation

## 2011-07-26 DIAGNOSIS — D702 Other drug-induced agranulocytosis: Secondary | ICD-10-CM

## 2011-07-26 DIAGNOSIS — M898X9 Other specified disorders of bone, unspecified site: Secondary | ICD-10-CM

## 2011-07-26 DIAGNOSIS — F411 Generalized anxiety disorder: Secondary | ICD-10-CM

## 2011-07-26 DIAGNOSIS — G4700 Insomnia, unspecified: Secondary | ICD-10-CM

## 2011-07-26 LAB — CBC WITH DIFFERENTIAL/PLATELET
BASO%: 0.7 % (ref 0.0–2.0)
Eosinophils Absolute: 0.3 10*3/uL (ref 0.0–0.5)
LYMPH%: 27.7 % (ref 14.0–49.0)
MONO#: 0.5 10*3/uL (ref 0.1–0.9)
NEUT#: 1.5 10*3/uL (ref 1.5–6.5)
Platelets: 176 10*3/uL (ref 140–400)
RBC: 3.83 10*6/uL — ABNORMAL LOW (ref 4.20–5.82)
RDW: 18.3 % — ABNORMAL HIGH (ref 11.0–14.6)
WBC: 3.3 10*3/uL — ABNORMAL LOW (ref 4.0–10.3)
lymph#: 0.9 10*3/uL (ref 0.9–3.3)
nRBC: 0 % (ref 0–0)

## 2011-07-26 MED ORDER — ZOLPIDEM TARTRATE 5 MG PO TABS: 5.0000 mg | ORAL_TABLET | Freq: Every evening | ORAL | Status: DC | PRN

## 2011-07-26 MED ORDER — ESCITALOPRAM OXALATE 10 MG PO TABS: 10.0000 mg | ORAL_TABLET | Freq: Every day | ORAL | Status: DC

## 2011-07-26 NOTE — Progress Notes (Signed)
[facility]  [phone] [phone]   OFFICE PROGRESS NOTE   Cc:  [name], MD, MD  DIAGNOSIS: IgG lambda multiple myeloma; presented with anemia, lytic bone lesions. Initial M-spike was 5.1gm/dL; free serum lambda of 6.29 mg/dL; Ig G 7760 mg/dL; BMWU-1-[id number] of 2.47. Bone marrow biopsy showed 35% plasma cell; cytogenetics per FISH was positive for t(11;14).   CURRENT THERAPY: started on 03/19/11 [name] 1.3mg /m2 SQ d1, 4,8,11 q28 day; Revlimid 25 mg PO d1-21 q28day; Dexamethasone 40mg  PO weekly (even of week off of chemo). He is also on Acyclovir 400mg  PO BID; Bactrim DS Mon/Wed/Fri; Lovenox 40mg  SQ daily. He is also on qmonth Zometa for bone protection.   INTERVAL HISTORY: Shane Woodard 51 y.o. male returns for regular follow up with his wife, congregation nurse, and a Systems developer.  He still has moderate left flank pain.  Pain is crampy; moderate 6/10 with certain movement of the body; relieved with rest; does not radiate; not associated with leg weakness.  He is taking Fentanyl patch 25 mcg/hr change every 3 days.  He takes 3-4 Percocet per day for break through pain.  The pain is a little worse than it was a month ago. Denies pain over his spine and no neurological deficits.  He has good appetite and has regained almost all of his lost weight.  He denies fever, mucositis, nausea/vomiting, skin rash, leg swelling/cramp, abdominal pain, bleeding symptoms, neuropathy, paresthesia.  Reports that he has been tearful and nervous lately. Not sleeping well. Has intermittent neuropathic pain to his left leg.   Past Medical History  Diagnosis Date  . Anemia   . Arthritis   . Multiple myeloma   . S/P radiation therapy 05/03/11 - 05/04/11    Right Inferior/Anterior Rib Cage, Left Inferior/Anterior Rib Cage and Left Scapula/Shoulder  . Maintenance antineoplastic chemotherapy     [name], [name]    Past Surgical History    Procedure Date  . Left thumb surgery 11/26/2003  . Multiple extractions with alveoloplasty 03/28/2011    Procedure: MULTIPLE EXTRACION WITH ALVEOLOPLASTY;  Surgeon: Charlynne Pander, DDS;  Location: [facility];  Service: Oral Surgery;  Laterality: N/A;  Extraction of tooth #'s 2,3,4,5,6,7,8,9,10,11,12,13,14,15,17, 20,21,22,23,24,25,26,27,28, 31, and 32 with alveoloplasty and mandibular left torus reduction.    Current Outpatient Prescriptions  Medication Sig Dispense Refill  . acyclovir (ZOVIRAX) 400 MG tablet Take 1 tablet (400 mg total) by mouth daily.  30 tablet  3  . cyclobenzaprine (FLEXERIL) 10 MG tablet Take 10 mg by mouth 3 (three) times daily as needed.      Marland Kitchen dexamethasone (DECADRON) 4 MG tablet Take 40 mg by mouth every 7 (seven) days. Mondays.      Marland Kitchen docusate sodium (COLACE) 100 MG capsule Take 100 mg by mouth 3 (three) times daily as needed.      . enoxaparin (LOVENOX) 40 MG/0.4ML injection Inject 0.4 mLs (40 mg total) into the skin daily.  30 Syringe  2  . escitalopram (LEXAPRO) 10 MG tablet Take 1 tablet (10 mg total) by mouth daily.  30 tablet  2  . fentaNYL (DURAGESIC - DOSED MCG/HR) 25 MCG/HR Place 1 patch (25 mcg total) onto the skin every 3 (three) days.  10 patch  0  . lenalidomide (REVLIMID) 15 MG capsule Take 1 capsule (15 mg total) by mouth daily.  21 capsule  0  . ondansetron (ZOFRAN) 8 MG tablet TAKE 1 TABLET BY MOUTH EVERY 8 HOURS AS NEEDED TO PREVENT OR  TREAT NAUSEA  30 tablet  5  . oxyCODONE-acetaminophen (PERCOCET) 5-325 MG per tablet Take 1 tablet by mouth every 4 (four) hours as needed for pain.  120 tablet  0  . potassium chloride SA (K-DUR,KLOR-CON) 20 MEQ tablet Take 20 mEq by mouth 2 (two) times daily.      . prochlorperazine (COMPAZINE) 10 MG tablet Take 1 tablet (10 mg total) by mouth every 6 (six) hours as needed (Nausea or vomiting).  30 tablet  1  . Sennosides 15 MG CHEW Chew 2 each by mouth 2 (two) times daily as needed.      . sulfamethoxazole-trimethoprim  (BACTRIM DS,SEPTRA DS) 800-160 MG per tablet Take 1 tablet by mouth 3 (three) times a week.  60 tablet  1  . zolpidem (AMBIEN) 5 MG tablet Take 1 tablet (5 mg total) by mouth at bedtime as needed for sleep.  30 tablet  0    ALLERGIES:   has no known allergies.  REVIEW OF SYSTEMS:  The rest of the 14-point review of system was negative.   Filed Vitals:   07/26/11 1405  BP: 128/80  Pulse: 86  Temp: 97 F (36.1 C)   Wt Readings from Last 3 Encounters:  07/26/11 156 lb (70.761 kg)  06/28/11 150 lb 1.6 oz (68.085 kg)  05/31/11 142 lb 3.2 oz (64.501 kg)   ECOG Performance status: 1  PHYSICAL EXAMINATION:   General:  well-nourished man, in no acute distress.  Eyes:  no scleral icterus.  ENT:  There were no oropharyngeal lesions.  Neck was without thyromegaly.  Lymphatics:  Negative cervical, supraclavicular or axillary adenopathy.  Respiratory: lungs were clear bilaterally without wheezing or crackles.  Cardiovascular:  Regular rate and rhythm, S1/S2, without murmur, rub or gallop.  There was no pedal edema.  GI:  abdomen was soft, flat, nontender, nondistended, without organomegaly.  Muscoloskeletal:  no spinal tenderness of palpation of vertebral spine. There was some discomfort in palpation of the left lower posterior ribs without palpable mass. Skin exam was without echymosis, petichae.  Neuro exam was nonfocal.  Patient was able to get on and off exam table without assistance.  Gait was normal.  Patient was alerted and oriented.  Attention was good.   Language was appropriate.  Mood was normal without depression.  Speech was not pressured.  Thought content was not tangential.     LABORATORY/RADIOLOGY DATA:  Lab Results  Component Value Date   WBC 3.3* 07/26/2011   HGB 11.1* 07/26/2011   HCT 34.4* 07/26/2011   PLT 176 07/26/2011   GLUCOSE 78 07/16/2011   ALKPHOS 69 06/28/2011   ALT 44 06/28/2011   AST 19 06/28/2011   NA 138 07/16/2011   K 4.8 07/16/2011   CL 108 07/16/2011   CREATININE 0.87  07/16/2011   BUN 8 07/16/2011   CO2 22 07/16/2011   INR 1.09 04/10/2011    ASSESSMENT AND PLAN:   1. IgG lambda multiple myeloma:  - he has had 4 cycles of Revlimid/[name]/Dex. His performance status continues to improves.  He has responded to improvement of his M-spike.  He still has slight neutropenia.  Therefore, I recommended continuing same dose of chemo as 2nd cycle (which is attenuated compared to the first cycle) ([name] still at 1.3mg /m2; and Revlimid at 15mg ) -He is on Acyclovir, Bactrim, Lovenox prophylaxis which I advised him to continue.  - He will receive 4th dose of monthly Zometa on 07/30/11. He has not had complication from this med.  -  He saw [facility] BMT on 04/23/2011 to discuss role of BMT after induction chemo and decision was to proceed with auto BMT in the near future. I will continue to check monthly his response with SPEP and light chain. I have referred him back to [facility] to discuss BMT.   2. Compression fracture: s/p kyphoplasty x2 and radiation with now much better pain control.  Continue Fentanyl 25 mcg/hr.  I advised him to continue Percocet prn q6hr breakthrough pain.  He was instructed to let us know if he develops any pain in his spine or neurological changes and we can set him up for an MRI of his spine.  3. Constipation: I advised him to continue Colace.   4. Anemia/thrombocytopenia: Due to chemo. There is no active bleeding; there is no indication for transfusion.   5. Hypokalemia: due to chemo. He has been on KCl 20 mEq PO BID. I advised him to continue KCl.   6. Mild calorie/protein malnutrition: He has improved significantly compared to before. His weight is increasing.   7. Birth control:  Mr. Alipio inquired about intercourse with his wife.  I strongly advised him to use condom to prevent his wife from getting pregnant which can cause birth defect being on chemo.  8. Depression/Anxiety: Due to diagnosis. I have prescribed Lexapro 10 mg daily. Risks/benefits  have been reviewed.  9. Insomnia:  Due to depression and anxiety. I have given his a prescription for Ambien. Risks/benefits have been reviewed.  10. Follow up: He will start cycle # 5 of chemo on 07/30/11. We will see him in about 1 week prior to the 6th cycle of chemo.     The length of time of the face-to-face encounter was 25 minutes. More than 50% of time was spent counseling and coordination of care.

## 2011-07-26 NOTE — Telephone Encounter (Signed)
Per staff message from Anne, I have scheduled treatment appts. JMW  

## 2011-07-27 ENCOUNTER — Telehealth: Payer: Self-pay | Admitting: Oncology

## 2011-07-27 ENCOUNTER — Encounter: Payer: Self-pay | Admitting: Oncology

## 2011-07-27 NOTE — Telephone Encounter (Signed)
5/30, interpreter aware to p/u sch on 6/3   aom

## 2011-07-27 NOTE — Progress Notes (Signed)
Patient received four prescriptions from [facility] on 07/26/11 $12.00,his remaninig balance [facility] $63.48.

## 2011-07-27 NOTE — Progress Notes (Signed)
Encounter addended by: Amanda Pea, RN on: 07/27/2011  5:37 PM<BR>     Documentation filed: Charges VN

## 2011-07-30 ENCOUNTER — Encounter: Payer: Self-pay | Admitting: *Deleted

## 2011-07-30 ENCOUNTER — Other Ambulatory Visit: Payer: Self-pay | Admitting: Oncology

## 2011-07-30 ENCOUNTER — Ambulatory Visit (HOSPITAL_BASED_OUTPATIENT_CLINIC_OR_DEPARTMENT_OTHER): Payer: Medicaid Other

## 2011-07-30 VITALS — BP 139/86 | HR 90 | Temp 99.1°F

## 2011-07-30 DIAGNOSIS — Z5112 Encounter for antineoplastic immunotherapy: Secondary | ICD-10-CM

## 2011-07-30 DIAGNOSIS — C9000 Multiple myeloma not having achieved remission: Secondary | ICD-10-CM

## 2011-07-30 LAB — KAPPA/LAMBDA LIGHT CHAINS: Lambda Free Lght Chn: 0.49 mg/dL — ABNORMAL LOW (ref 0.57–2.63)

## 2011-07-30 LAB — PROTEIN ELECTROPHORESIS, SERUM
Gamma Globulin: 15.9 % (ref 11.1–18.8)
M-Spike, %: 0.73 g/dL
Total Protein, Serum Electrophoresis: 5.6 g/dL — ABNORMAL LOW (ref 6.0–8.3)

## 2011-07-30 LAB — COMPREHENSIVE METABOLIC PANEL
ALT: 29 U/L (ref 0–53)
AST: 13 U/L (ref 0–37)
Albumin: 3.4 g/dL — ABNORMAL LOW (ref 3.5–5.2)
CO2: 25 mEq/L (ref 19–32)
Calcium: 8.2 mg/dL — ABNORMAL LOW (ref 8.4–10.5)
Chloride: 110 mEq/L (ref 96–112)
Potassium: 3.7 mEq/L (ref 3.5–5.3)
Sodium: 140 mEq/L (ref 135–145)
Total Protein: 5.6 g/dL — ABNORMAL LOW (ref 6.0–8.3)

## 2011-07-30 MED ORDER — ONDANSETRON HCL 8 MG PO TABS
8.0000 mg | ORAL_TABLET | Freq: Once | ORAL | Status: AC
  Administered 2011-07-30: 8 mg via ORAL

## 2011-07-30 MED ORDER — ZOLEDRONIC ACID 4 MG/100ML IV SOLN
4.0000 mg | Freq: Once | INTRAVENOUS | Status: AC
  Administered 2011-07-30: 4 mg via INTRAVENOUS
  Filled 2011-07-30: qty 100

## 2011-07-30 MED ORDER — SODIUM CHLORIDE 0.9 % IV SOLN
Freq: Once | INTRAVENOUS | Status: AC
  Administered 2011-07-30: 10:00:00 via INTRAVENOUS

## 2011-07-30 MED ORDER — BORTEZOMIB CHEMO SQ INJECTION 3.5 MG (2.5MG/ML)
1.3000 mg/m2 | Freq: Once | INTRAMUSCULAR | Status: AC
  Administered 2011-07-30: 2.25 mg via SUBCUTANEOUS
  Filled 2011-07-30: qty 2.25

## 2011-07-30 NOTE — Patient Instructions (Signed)
[facility] Discharge Instructions for Patients Receiving Chemotherapy  Today you received the following chemotherapy agents [name] and Zometa.  To help prevent nausea and vomiting after your treatment, we encourage you to take your nausea medication.   If you develop nausea and vomiting that is not controlled by your nausea medication, call the clinic. If it is after clinic hours your family physician or the after hours number for the clinic or go to the Emergency Department.   BELOW ARE SYMPTOMS THAT SHOULD BE REPORTED IMMEDIATELY:  *FEVER GREATER THAN 100.5 F  *CHILLS WITH OR WITHOUT FEVER  NAUSEA AND VOMITING THAT IS NOT CONTROLLED WITH YOUR NAUSEA MEDICATION  *UNUSUAL SHORTNESS OF BREATH  *UNUSUAL BRUISING OR BLEEDING  TENDERNESS IN MOUTH AND THROAT WITH OR WITHOUT PRESENCE OF ULCERS  *URINARY PROBLEMS  *BOWEL PROBLEMS  UNUSUAL RASH Items with * indicate a potential emergency and should be followed up as soon as possible.  One of the nurses will contact you 24 hours after your treatment. Please let the nurse know about any problems that you may have experienced. Feel free to call the clinic you have any questions or concerns. The clinic phone number is [phone].   I have been informed and understand all the instructions given to me. I know to contact the clinic, my physician, or go to the Emergency Department if any problems should occur. I do not have any questions at this time, but understand that I may call the clinic during office hours   should I have any questions or need assistance in obtaining follow up care.    __________________________________________  _____________  __________ Signature of Patient or Authorized Representative            Date                   Time    __________________________________________ Nurse's Signature    

## 2011-07-30 NOTE — Telephone Encounter (Signed)
[facility] faxed confirmation of prescription shipment.  Revlimid shipped 07-27-11 with next business day delivery.

## 2011-07-31 ENCOUNTER — Other Ambulatory Visit: Payer: Self-pay | Admitting: *Deleted

## 2011-07-31 MED ORDER — POTASSIUM CHLORIDE CRYS ER 20 MEQ PO TBCR: 20.0000 meq | EXTENDED_RELEASE_TABLET | Freq: Two times a day (BID) | ORAL | Status: DC

## 2011-07-31 MED ORDER — CYCLOBENZAPRINE HCL 5 MG PO TABS: 5.0000 mg | ORAL_TABLET | Freq: Three times a day (TID) | ORAL | Status: AC | PRN

## 2011-07-31 NOTE — Telephone Encounter (Signed)
Also confirmed with pharmacy that acyclovir is 400 mg daily. Confirmed he has been getting 5 mg instead of 10 mg of cyclobenzaprine

## 2011-08-02 ENCOUNTER — Encounter: Payer: Self-pay | Admitting: Oncology

## 2011-08-02 NOTE — Progress Notes (Signed)
Patient received medication from [facility] remaining balance 348.82

## 2011-08-06 ENCOUNTER — Ambulatory Visit (HOSPITAL_BASED_OUTPATIENT_CLINIC_OR_DEPARTMENT_OTHER): Payer: Medicaid Other

## 2011-08-06 ENCOUNTER — Telehealth: Payer: Self-pay | Admitting: *Deleted

## 2011-08-06 ENCOUNTER — Encounter: Payer: Self-pay | Admitting: Oncology

## 2011-08-06 ENCOUNTER — Other Ambulatory Visit: Payer: Self-pay | Admitting: *Deleted

## 2011-08-06 ENCOUNTER — Other Ambulatory Visit (HOSPITAL_BASED_OUTPATIENT_CLINIC_OR_DEPARTMENT_OTHER): Payer: Medicaid Other | Admitting: Lab

## 2011-08-06 VITALS — BP 151/95 | HR 91 | Temp 97.7°F

## 2011-08-06 DIAGNOSIS — D649 Anemia, unspecified: Secondary | ICD-10-CM

## 2011-08-06 DIAGNOSIS — Z5112 Encounter for antineoplastic immunotherapy: Secondary | ICD-10-CM

## 2011-08-06 DIAGNOSIS — C9000 Multiple myeloma not having achieved remission: Secondary | ICD-10-CM

## 2011-08-06 DIAGNOSIS — M898X9 Other specified disorders of bone, unspecified site: Secondary | ICD-10-CM

## 2011-08-06 LAB — CBC WITH DIFFERENTIAL/PLATELET
BASO%: 0.5 % (ref 0.0–2.0)
EOS%: 6 % (ref 0.0–7.0)
MCHC: 32.7 g/dL (ref 32.0–36.0)
MONO#: 0.2 10*3/uL (ref 0.1–0.9)
RBC: 4.22 10*6/uL (ref 4.20–5.82)
WBC: 4.6 10*3/uL (ref 4.0–10.3)
lymph#: 1.1 10*3/uL (ref 0.9–3.3)

## 2011-08-06 LAB — BASIC METABOLIC PANEL
CO2: 24 mEq/L (ref 19–32)
Calcium: 9.1 mg/dL (ref 8.4–10.5)
Chloride: 105 mEq/L (ref 96–112)
Sodium: 139 mEq/L (ref 135–145)

## 2011-08-06 MED ORDER — OXYCODONE-ACETAMINOPHEN 5-325 MG PO TABS: 1.0000 | ORAL_TABLET | ORAL | Status: DC | PRN

## 2011-08-06 MED ORDER — BORTEZOMIB CHEMO SQ INJECTION 3.5 MG (2.5MG/ML)
1.3000 mg/m2 | Freq: Once | INTRAMUSCULAR | Status: AC
  Administered 2011-08-06: 2.25 mg via SUBCUTANEOUS
  Filled 2011-08-06: qty 2.25

## 2011-08-06 MED ORDER — ONDANSETRON HCL 8 MG PO TABS
8.0000 mg | ORAL_TABLET | Freq: Once | ORAL | Status: AC
  Administered 2011-08-06: 8 mg via ORAL

## 2011-08-06 NOTE — Telephone Encounter (Signed)
Call from [facility] about pt's upcoming appt at [facility] her back and left VM informing of appt at [facility] w/ Dr. Greggory Stallion

## 2011-08-06 NOTE — Progress Notes (Signed)
Follow-up call to [facility] (Dr Greggory Stallion). Patient will have a follow-up appointment on 08/08/11 @ 130pm. I spoke with the patient and gave him this information. I also left a message for the interpreter Jamelle Haring) to be sure that the patient understood. Patient will go to the 3rd floor cancer center at [facility].

## 2011-08-06 NOTE — Patient Instructions (Signed)
[facility] Discharge Instructions for Patients Receiving Chemotherapy  Today you received the following chemotherapy agents velcade  To help prevent nausea and vomiting after your treatment, we encourage you to take your nausea medication If you develop nausea and vomiting that is not controlled by your nausea medication, call the clinic. If it is after clinic hours your family physician or the after hours number for the clinic or go to the Emergency Department.   BELOW ARE SYMPTOMS THAT SHOULD BE REPORTED IMMEDIATELY:  *FEVER GREATER THAN 100.5 F  *CHILLS WITH OR WITHOUT FEVER  NAUSEA AND VOMITING THAT IS NOT CONTROLLED WITH YOUR NAUSEA MEDICATION  *UNUSUAL SHORTNESS OF BREATH  *UNUSUAL BRUISING OR BLEEDING  TENDERNESS IN MOUTH AND THROAT WITH OR WITHOUT PRESENCE OF ULCERS  *URINARY PROBLEMS  *BOWEL PROBLEMS  UNUSUAL RASH Items with * indicate a potential emergency and should be followed up as soon as possible.  One of the nurses will contact you 24 hours after your treatment. Please let the nurse know about any problems that you may have experienced. Feel free to call the clinic you have any questions or concerns. The clinic phone number is [phone].   I have been informed and understand all the instructions given to me. I know to contact the clinic, my physician, or go to the Emergency Department if any problems should occur. I do not have any questions at this time, but understand that I may call the clinic during office hours   should I have any questions or need assistance in obtaining follow up care.    __________________________________________  _____________  __________ Signature of Patient or Authorized Representative            Date                   Time    __________________________________________ Nurse's Signature    

## 2011-08-07 ENCOUNTER — Telehealth: Payer: Self-pay | Admitting: *Deleted

## 2011-08-07 NOTE — Telephone Encounter (Signed)
Received call from interpreter, Jamelle Haring, confirming pt's appt at [facility] w/ Dr. Greggory Stallion.  She wanted to confirm also that an interpreter will be present/ provided by [facility] for pt's visit.   Called [facility]/Dr. Hurd's office [phone] and left VM to request [place] Interpreter be present for pt's visit tomorrow.

## 2011-08-10 ENCOUNTER — Encounter: Payer: Self-pay | Admitting: Oncology

## 2011-08-10 ENCOUNTER — Telehealth: Payer: Self-pay | Admitting: *Deleted

## 2011-08-10 NOTE — Progress Notes (Signed)
PATIENT RECEIVED MEDICATION FROM [facility] 6.00 REMAINING BALANCE 45.18

## 2011-08-10 NOTE — Telephone Encounter (Signed)
Received message from Clydie Braun w/ Dr. Greggory Stallion at [facility] they are moving forward in pre transplant planning. Pt has next appt there on 08/16/11.  Pt is instructed to stop his Revlimid and [name].   Per Dr. Gaylyn Rong pt is to also stop his Bactrim, but continue the Acyclovir and Lovenox for now unless instructed otherwise by Dr. Greggory Stallion.  Chemo appt canceled for 6/17.    Called [place] and gave above instructions,  Requested she call pt w/ instructions.  She verbalized understanding.

## 2011-08-13 ENCOUNTER — Other Ambulatory Visit: Payer: Self-pay | Admitting: *Deleted

## 2011-08-13 ENCOUNTER — Ambulatory Visit: Payer: Self-pay

## 2011-08-13 ENCOUNTER — Telehealth: Payer: Self-pay | Admitting: *Deleted

## 2011-08-13 ENCOUNTER — Other Ambulatory Visit: Payer: Self-pay | Admitting: Lab

## 2011-08-13 ENCOUNTER — Ambulatory Visit: Payer: No Typology Code available for payment source

## 2011-08-13 DIAGNOSIS — C9000 Multiple myeloma not having achieved remission: Secondary | ICD-10-CM

## 2011-08-13 NOTE — Telephone Encounter (Signed)
Call from pt's mother in law who manages pt's med box for him,   To clarify med changes..  Clarified 1.  Stop Revlimid  2. Stop Bactrim  3.Continue Lovenox injections  4. Continue Acyclovir  Per Dr. Lodema Pilot instructions.  Informed pt to be seen at [facility] again later this week and then follow any instructions per Dr. Jarrett Ables at that time regarding pt's medications.  She verbalized understanding.

## 2011-08-15 ENCOUNTER — Telehealth: Payer: Self-pay | Admitting: Oncology

## 2011-08-15 NOTE — Telephone Encounter (Signed)
called pt and informed him that his appts were cancelled and his next appt is on 07/31

## 2011-08-20 ENCOUNTER — Other Ambulatory Visit: Payer: Self-pay

## 2011-08-20 DIAGNOSIS — M898X9 Other specified disorders of bone, unspecified site: Secondary | ICD-10-CM

## 2011-08-20 MED ORDER — FENTANYL 25 MCG/HR TD PT72: 1.0000 | MEDICATED_PATCH | TRANSDERMAL | Status: AC

## 2011-08-21 ENCOUNTER — Telehealth: Payer: Self-pay | Admitting: *Deleted

## 2011-08-21 NOTE — Telephone Encounter (Signed)
[facility] faxed revlimid refill request.  Request to MD for review. 

## 2011-08-23 ENCOUNTER — Ambulatory Visit: Payer: Self-pay | Admitting: Oncology

## 2011-08-23 ENCOUNTER — Other Ambulatory Visit: Payer: Self-pay | Admitting: Lab

## 2011-08-27 ENCOUNTER — Ambulatory Visit: Payer: Self-pay

## 2011-08-27 NOTE — Progress Notes (Signed)
Called [facility] to inform to hold Revlimid for 2-3 months; patient schedule for bone marrow transplant [phone])

## 2011-08-31 ENCOUNTER — Other Ambulatory Visit: Payer: Self-pay | Admitting: *Deleted

## 2011-08-31 ENCOUNTER — Other Ambulatory Visit: Payer: Self-pay | Admitting: Radiation Oncology

## 2011-08-31 NOTE — Telephone Encounter (Signed)
EXPLAINED TO PT. THAT THE MEDICATION BOTTLES HAVE REFILLS LEFT ON THEM. INSTRUCTED HIM TO TAKE THE MEDICATION BOTTLES TO THE [place] [facility] TO BE FILLED. ALSO ENCOURAGED PT. TO TALK WITH THE PHARMACY STAFF TO SEE IF THEY CAN HELP HIM WITH THE REFILL PROCESS VIA PHONE. HE VOICES UNDERSTANDING.

## 2011-09-03 ENCOUNTER — Ambulatory Visit: Payer: Self-pay

## 2011-09-05 ENCOUNTER — Encounter (HOSPITAL_COMMUNITY): Payer: Self-pay | Admitting: Dentistry

## 2011-09-05 ENCOUNTER — Ambulatory Visit (HOSPITAL_COMMUNITY): Payer: Medicaid - Dental | Admitting: Dentistry

## 2011-09-05 ENCOUNTER — Other Ambulatory Visit: Payer: Self-pay | Admitting: *Deleted

## 2011-09-05 VITALS — BP 139/87 | HR 99 | Temp 98.5°F

## 2011-09-05 DIAGNOSIS — Z463 Encounter for fitting and adjustment of dental prosthetic device: Secondary | ICD-10-CM

## 2011-09-05 DIAGNOSIS — K062 Gingival and edentulous alveolar ridge lesions associated with trauma: Secondary | ICD-10-CM

## 2011-09-05 DIAGNOSIS — K1370 Unspecified lesions of oral mucosa: Secondary | ICD-10-CM

## 2011-09-05 NOTE — Progress Notes (Signed)
Wednesday, September 05, 2011   BP: 139/87     P:  99        T: 98.5  Shane Woodard presents with interpreter for adjustment of upper and lower complete dentures. Subjective: Patient is complaining of denture irritation to the mandibular right anterior alveolar ridge area only. Exam: There is no evidence of denture irritation or erythema. Procedure: Pressure indicating paste applied to dentures. Adjustments made as needed. [place]. Occlusion evaluated and adjustments made as needed for centric relation and protrusive stokes. Patient with tendency to protrude to end to end position. Patient again instructed on how to find maximum intercuspation position. Patient accepts results. Patient to keep dentures out if sore spots develop. Use salt water rinses as needed to aid healing. RTC as scheduled for denture adjustment. Call if problems arise before then. Patient dismissed in stable condition. Dr. Cindra Eves

## 2011-09-05 NOTE — Telephone Encounter (Signed)
Pt in lobby earlier today prior to his dental appt,  Requested refill on Decadron, Lovenox and Fentanyl patches.  Instructed pt to stop Decadron d/t his Revlimid and [name] were stopped.  He verbalized understanding.  Instructed pt he has refills on his lovenox,  To contact pharmacy for refills and to continue lovenox daily.  He verbalized understanding.  Instructed too early for refill on Fentanyl.  Pt says he has 3 patches left.  Instructed him to call for [place] to give him new Rx when he has only one patch left.  Pt verbalized understanding.

## 2011-09-10 ENCOUNTER — Ambulatory Visit: Payer: Self-pay

## 2011-09-10 ENCOUNTER — Ambulatory Visit: Payer: No Typology Code available for payment source

## 2011-09-13 ENCOUNTER — Other Ambulatory Visit: Payer: Self-pay | Admitting: *Deleted

## 2011-09-13 ENCOUNTER — Telehealth: Payer: Self-pay | Admitting: *Deleted

## 2011-09-13 ENCOUNTER — Telehealth: Payer: Self-pay | Admitting: Oncology

## 2011-09-13 DIAGNOSIS — C9000 Multiple myeloma not having achieved remission: Secondary | ICD-10-CM

## 2011-09-13 NOTE — Telephone Encounter (Signed)
Received call from [facility] re pt being discharged and needing lbs @ [facility] next wk. Message taken to desk nurse and Kendal Hymen also given phone/fax for desk nurse.

## 2011-09-13 NOTE — Telephone Encounter (Signed)
Call from Transplant Coordinator at [facility], pt needs labs next week on Mon, Wed and Fri to include CBC, CMET and Mag on Mon 7/22 and CBC only on 7/24 and 7/26.   Pt to return to [facility] for stem cell collection on 09/24/11.   Lab orders entered and POF sent for lab appts and to cancel pt's appt here on 09/26/11.

## 2011-09-14 ENCOUNTER — Telehealth: Payer: Self-pay | Admitting: Oncology

## 2011-09-14 NOTE — Telephone Encounter (Signed)
S/w pt today via [place] interpre[place] re appts for 7/22, 7/24, and 7/26. Pt to get new schedule when he comes in 7/22. Message sent to Kahi Mohala in clinical social re interpreter for above appts. [place] made aware that pt is requesting [place].

## 2011-09-17 ENCOUNTER — Other Ambulatory Visit (HOSPITAL_BASED_OUTPATIENT_CLINIC_OR_DEPARTMENT_OTHER): Payer: Medicaid Other | Admitting: Lab

## 2011-09-17 ENCOUNTER — Telehealth: Payer: Self-pay | Admitting: *Deleted

## 2011-09-17 DIAGNOSIS — C9000 Multiple myeloma not having achieved remission: Secondary | ICD-10-CM

## 2011-09-17 LAB — CBC WITH DIFFERENTIAL/PLATELET
Basophils Absolute: 0 10*3/uL (ref 0.0–0.1)
Eosinophils Absolute: 0 10*3/uL (ref 0.0–0.5)
HCT: 34.5 % — ABNORMAL LOW (ref 38.4–49.9)
HGB: 11.4 g/dL — ABNORMAL LOW (ref 13.0–17.1)
LYMPH%: 28.4 % (ref 14.0–49.0)
MCV: 88.5 fL (ref 79.3–98.0)
MONO#: 0 10*3/uL — ABNORMAL LOW (ref 0.1–0.9)
MONO%: 2.7 % (ref 0.0–14.0)
NEUT#: 0.5 10*3/uL — ABNORMAL LOW (ref 1.5–6.5)
NEUT%: 60.8 % (ref 39.0–75.0)
Platelets: 86 10*3/uL — ABNORMAL LOW (ref 140–400)
RBC: 3.9 10*6/uL — ABNORMAL LOW (ref 4.20–5.82)
WBC: 0.7 10*3/uL — CL (ref 4.0–10.3)

## 2011-09-17 LAB — BASIC METABOLIC PANEL
BUN: 11 mg/dL (ref 6–23)
CO2: 25 mEq/L (ref 19–32)
Chloride: 103 mEq/L (ref 96–112)
Creatinine, Ser: 0.91 mg/dL (ref 0.50–1.35)
Glucose, Bld: 89 mg/dL (ref 70–99)
Potassium: 4.1 mEq/L (ref 3.5–5.3)

## 2011-09-17 NOTE — Telephone Encounter (Signed)
Received panic lab results and faxed to [facility] fax [phone], Att: Gardiner Coins, RN. (According to orders received from [facility], pt is taking Neupogen 780 mcg at home daily) Called results to [facility] #[phone], (on hold for nurse line for over 10 minutes).  Rogene, RN answered and I gave verbal report WBC 0.7 and ANC 0.5.

## 2011-09-17 NOTE — Telephone Encounter (Signed)
Message copied by Wende Mott on Mon Sep 17, 2011  2:25 PM ------      Message from: HA, Raliegh Ip T      Created: Mon Sep 17, 2011  2:20 PM       Are we supposed to do something with this Neutropenia (according to [facility])?  Is he on Neulasta for bone marrow harvest?  Thankx.

## 2011-09-19 ENCOUNTER — Other Ambulatory Visit: Payer: Self-pay | Admitting: Lab

## 2011-09-21 ENCOUNTER — Other Ambulatory Visit: Payer: Self-pay | Admitting: *Deleted

## 2011-09-21 ENCOUNTER — Other Ambulatory Visit: Payer: Self-pay | Admitting: Oncology

## 2011-09-21 ENCOUNTER — Other Ambulatory Visit (HOSPITAL_BASED_OUTPATIENT_CLINIC_OR_DEPARTMENT_OTHER): Payer: Medicaid Other | Admitting: Lab

## 2011-09-21 ENCOUNTER — Encounter: Payer: Self-pay | Admitting: *Deleted

## 2011-09-21 DIAGNOSIS — C9000 Multiple myeloma not having achieved remission: Secondary | ICD-10-CM

## 2011-09-21 LAB — CBC WITH DIFFERENTIAL/PLATELET
BASO%: 2.2 % — ABNORMAL HIGH (ref 0.0–2.0)
EOS%: 1.1 % (ref 0.0–7.0)
LYMPH%: 28 % (ref 14.0–49.0)
MCH: 29.2 pg (ref 27.2–33.4)
MCHC: 33.7 g/dL (ref 32.0–36.0)
MCV: 86.8 fL (ref 79.3–98.0)
MONO%: 23.7 % — ABNORMAL HIGH (ref 0.0–14.0)
Platelets: 24 10*3/uL — ABNORMAL LOW (ref 140–400)
RBC: 3.49 10*6/uL — ABNORMAL LOW (ref 4.20–5.82)
nRBC: 6 % — ABNORMAL HIGH (ref 0–0)

## 2011-09-21 MED ORDER — OXYCODONE HCL 5 MG PO TABS: 5.0000 mg | ORAL_TABLET | ORAL | Status: AC | PRN

## 2011-09-21 NOTE — Progress Notes (Signed)
Faxed CBC to [facility] at [phone].  Called Panic Lab results to [facility] at Hem/Onc clinic,  Spoke to [place].

## 2011-09-26 ENCOUNTER — Telehealth: Payer: Self-pay | Admitting: *Deleted

## 2011-09-26 ENCOUNTER — Ambulatory Visit: Payer: Self-pay | Admitting: Oncology

## 2011-09-26 ENCOUNTER — Other Ambulatory Visit: Payer: Self-pay

## 2011-09-26 NOTE — Telephone Encounter (Signed)
Called pt regarding his appt for today.  Per Dr. Gaylyn Rong he does not need to see pt again until after his Bone Marrow Transplant at [facility].  Pt says he is scheduled to return to [facility] on 10/01/11 to be admitted for transplant.  Asked pt if his pain is better and he says it is better than it was last week. He denies any new problems/concerns.  Instructed pt to call for any problems or needs and we will plan on seeing him after he is released from [facility].  Pt verbalized understanding.

## 2011-09-27 ENCOUNTER — Telehealth: Payer: Self-pay | Admitting: *Deleted

## 2011-09-27 NOTE — Telephone Encounter (Signed)
mailed out calendar to inform the patient of the new date and time of the new appointments

## 2011-10-17 ENCOUNTER — Telehealth: Payer: Self-pay | Admitting: *Deleted

## 2011-10-17 ENCOUNTER — Other Ambulatory Visit: Payer: Self-pay | Admitting: Oncology

## 2011-10-17 ENCOUNTER — Ambulatory Visit: Payer: Self-pay | Admitting: Oncology

## 2011-10-17 ENCOUNTER — Other Ambulatory Visit: Payer: Self-pay | Admitting: Lab

## 2011-10-17 DIAGNOSIS — C9000 Multiple myeloma not having achieved remission: Secondary | ICD-10-CM

## 2011-10-17 NOTE — Telephone Encounter (Signed)
Rec'd call and orders from Gardiner Coins, RN at [facility] for pt to have labs and f/u visit w/ Dr. Gaylyn Rong. Pt s/p Bone Marrow Transplant.  Labs on 8/23, 8/26 and 8/29.  Lab w/ office visit on 8/26.   POF sent to scheduling.   I called and left VM at ph# [phone] for [facility] w/ appt date and time information.

## 2011-10-17 NOTE — Telephone Encounter (Signed)
appts made and [facility] to call pt with appts,per [facility] d/c dr Gaylyn Rong 8/21 pof   aom

## 2011-10-17 NOTE — Progress Notes (Signed)
Patient is still admitted at [facility] for bone marrow transplant. We'll reschedule him to see [place] in about 1 month.

## 2011-10-19 ENCOUNTER — Ambulatory Visit (HOSPITAL_BASED_OUTPATIENT_CLINIC_OR_DEPARTMENT_OTHER): Payer: Medicaid Other | Admitting: Lab

## 2011-10-19 DIAGNOSIS — C9000 Multiple myeloma not having achieved remission: Secondary | ICD-10-CM

## 2011-10-19 LAB — BASIC METABOLIC PANEL
BUN: 6 mg/dL (ref 6–23)
CO2: 26 mEq/L (ref 19–32)
Chloride: 103 mEq/L (ref 96–112)
Glucose, Bld: 92 mg/dL (ref 70–99)
Potassium: 3.5 mEq/L (ref 3.5–5.3)

## 2011-10-19 LAB — CBC WITH DIFFERENTIAL/PLATELET
Basophils Absolute: 0 10*3/uL (ref 0.0–0.1)
Eosinophils Absolute: 0 10*3/uL (ref 0.0–0.5)
HCT: 32.6 % — ABNORMAL LOW (ref 38.4–49.9)
HGB: 11 g/dL — ABNORMAL LOW (ref 13.0–17.1)
MCH: 28.1 pg (ref 27.2–33.4)
MONO#: 1.6 10*3/uL — ABNORMAL HIGH (ref 0.1–0.9)
NEUT%: 41.4 % (ref 39.0–75.0)
WBC: 4.2 10*3/uL (ref 4.0–10.3)
lymph#: 0.8 10*3/uL — ABNORMAL LOW (ref 0.9–3.3)

## 2011-10-19 NOTE — Progress Notes (Signed)
Faxed CBC, BMET and Mag to [facility] att; Gardiner Coins, RN at fax [phone].

## 2011-10-22 ENCOUNTER — Other Ambulatory Visit (HOSPITAL_BASED_OUTPATIENT_CLINIC_OR_DEPARTMENT_OTHER): Payer: Medicaid Other

## 2011-10-22 ENCOUNTER — Ambulatory Visit (HOSPITAL_BASED_OUTPATIENT_CLINIC_OR_DEPARTMENT_OTHER): Payer: Medicaid Other | Admitting: Oncology

## 2011-10-22 ENCOUNTER — Encounter: Payer: Self-pay | Admitting: *Deleted

## 2011-10-22 ENCOUNTER — Telehealth: Payer: Self-pay | Admitting: Oncology

## 2011-10-22 VITALS — BP 112/80 | HR 115 | Temp 96.8°F | Resp 18 | Ht 65.0 in | Wt 143.4 lb

## 2011-10-22 DIAGNOSIS — D649 Anemia, unspecified: Secondary | ICD-10-CM

## 2011-10-22 DIAGNOSIS — IMO0002 Reserved for concepts with insufficient information to code with codable children: Secondary | ICD-10-CM

## 2011-10-22 DIAGNOSIS — C9000 Multiple myeloma not having achieved remission: Secondary | ICD-10-CM

## 2011-10-22 LAB — BASIC METABOLIC PANEL (CC13)
BUN: 6 mg/dL — ABNORMAL LOW (ref 7.0–26.0)
CO2: 23 mEq/L (ref 22–29)
Chloride: 102 mEq/L (ref 98–107)
Creatinine: 0.8 mg/dL (ref 0.7–1.3)
Glucose: 94 mg/dl (ref 70–99)

## 2011-10-22 LAB — CBC WITH DIFFERENTIAL/PLATELET
Basophils Absolute: 0 10*3/uL (ref 0.0–0.1)
EOS%: 0.2 % (ref 0.0–7.0)
HCT: 36.4 % — ABNORMAL LOW (ref 38.4–49.9)
HGB: 12.4 g/dL — ABNORMAL LOW (ref 13.0–17.1)
MCH: 29.1 pg (ref 27.2–33.4)
MCV: 85.1 fL (ref 79.3–98.0)
MONO%: 22 % — ABNORMAL HIGH (ref 0.0–14.0)
NEUT%: 43.6 % (ref 39.0–75.0)

## 2011-10-22 NOTE — Telephone Encounter (Signed)
appts made and printed for pt aom °

## 2011-10-22 NOTE — Progress Notes (Signed)
[facility]  Telephone:([phone] Fax:(336) [phone]   OFFICE PROGRESS NOTE   Cc:  HUSAIN,KARRAR, MD  DIAGNOSIS: IgG lambda multiple myeloma; presented with anemia, lytic bone lesions. Initial M-spike was 5.1gm/dL; free serum lambda of 1.47 mg/dL; Ig G 7760 mg/dL; [id number] of 2.47. Bone marrow biopsy showed 35% plasma cell; cytogenetics per FISH was positive for t(11;14).   PAST THERAPY: started on 03/19/11 [name] 1.3mg /m2 SQ d1, 4,8,11 q28 day; Revlimid 25 mg PO d1-21 q28day; Dexamethasone 40mg  PO weekly (even of week off of chemo). He was also on Acyclovir 400mg  PO BID; Bactrim DS Mon/Wed/Fri; Lovenox 40mg  SQ daily. He was also on qmonth Zometa for bone protection.  He is s/p autologous BMT at [facility] on 10/02/2011.   INTERVAL HISTORY: Shane Woodard 51 y.o. male returns for regular follow up with a [place] translator.  He reported weakness, low appetite, weight loss from the recent BMT.  His back/bone pain is much better now compared to before.  He only takes Oxycodone IR about twice a day.  He ambulates around his house but only about 5-10 min each day.  He only drinks at most one can of Ensure daily.    Patient denies fever, headache, visual changes, confusion, drenching night sweats, palpable lymph node swelling, mucositis, odynophagia, dysphagia, nausea vomiting, jaundice, chest pain, palpitation, shortness of breath, dyspnea on exertion, productive cough, gum bleeding, epistaxis, hematemesis, hemoptysis, abdominal pain, abdominal swelling, early satiety, melena, hematochezia, hematuria, skin rash, spontaneous bleeding, joint swelling, joint pain, heat or cold intolerance, bowel bladder incontinence, back pain, focal motor weakness, paresthesia, depression, suicidal or homicidal ideation, feeling hopelessness.\os   Past Medical History  Diagnosis Date  . Anemia   . Arthritis   . Multiple myeloma   . S/P radiation therapy 05/03/11 - 05/04/11    Right  Inferior/Anterior Rib Cage, Left Inferior/Anterior Rib Cage and Left Scapula/Shoulder  . Maintenance antineoplastic chemotherapy     [name], [name]    Past Surgical History  Procedure Date  . Left thumb surgery 11/26/2003  . Multiple extractions with alveoloplasty 03/28/2011    Procedure: MULTIPLE EXTRACION WITH ALVEOLOPLASTY;  Surgeon: Charlynne Pander, DDS;  Location: [facility];  Service: Oral Surgery;  Laterality: N/A;  Extraction of tooth #'s 2,3,4,5,6,7,8,9,10,11,12,13,14,15,17, 20,21,22,23,24,25,26,27,28, 31, and 32 with alveoloplasty and mandibular left torus reduction.    Current Outpatient Prescriptions  Medication Sig Dispense Refill  . acyclovir (ZOVIRAX) 800 MG tablet Take 800 mg by mouth 2 (two) times daily. Help prevent Viral infection      . cyclobenzaprine (FLEXERIL) 5 MG tablet Take 5 mg by mouth 3 (three) times daily as needed. As needed for muscle spasms      . docusate sodium (COLACE) 100 MG capsule Take 100 mg by mouth 3 (three) times daily as needed. Take as needed for constipation      . escitalopram (LEXAPRO) 10 MG tablet Take 1 tablet (10 mg total) by mouth daily.  30 tablet  2  . folic acid (FOLVITE) 1 MG tablet Take 1 mg by mouth daily.      Marland Kitchen levofloxacin (LEVAQUIN) 750 MG tablet Take 750 mg by mouth daily. Helps prevent infection.      . ondansetron (ZOFRAN) 8 MG tablet TAKE 1 TABLET BY MOUTH EVERY 8 HOURS AS NEEDED TO PREVENT OR TREAT NAUSEA  30 tablet  5  . oxycodone (OXY-IR) 5 MG capsule Take 5 mg by mouth every 4 (four) hours as needed. As needed for pain      .  prochlorperazine (COMPAZINE) 10 MG tablet TAKE 1 TABLET BY MOUTH EVERY 6 HOURS AS NEEDED FOR NAUSEA/VOMITING  30 tablet  1  . sulfamethoxazole-trimethoprim (BACTRIM DS,SEPTRA DS) 800-160 MG per tablet Take 1 tablet by mouth 3 (three) times a week. Take one table on Monday, Wednesday and Friday.  Helps prevent Infection.        ALLERGIES:   has no known allergies.  REVIEW OF  SYSTEMS:  The rest of the 14-point review of system was negative.   Filed Vitals:   10/22/11 0955  BP: 112/80  Pulse: 115  Temp: 96.8 F (36 C)  Resp: 18   Wt Readings from Last 3 Encounters:  10/22/11 143 lb 6.4 oz (65.046 kg)  07/26/11 156 lb (70.761 kg)  06/28/11 150 lb 1.6 oz (68.085 kg)   ECOG Performance status: 1-2  PHYSICAL EXAMINATION:   General:  Thin-appearing man, in no acute distress.  Eyes:  no scleral icterus.  ENT:  There were no oropharyngeal lesions.  Neck was without thyromegaly.  Lymphatics:  Negative cervical, supraclavicular or axillary adenopathy.  Respiratory: lungs were clear bilaterally without wheezing or crackles.  Cardiovascular:  Regular rate and rhythm, S1/S2, without murmur, rub or gallop.  There was no pedal edema.  GI:  abdomen was soft, flat, nontender, nondistended, without organomegaly.  Muscoloskeletal:  no spinal tenderness of palpation of vertebral spine.  Skin exam was without echymosis, petichae.  Neuro exam was nonfocal.  Patient was able to get on and off exam table without assistance.  Gait was normal.  Patient was alerted and oriented.  Attention was good.   Language was appropriate.  Mood was normal without depression.  Speech was not pressured.  Thought content was not tangential.    LABORATORY/RADIOLOGY DATA:  Lab Results  Component Value Date   WBC 4.0 10/22/2011   HGB 12.4* 10/22/2011   HCT 36.4* 10/22/2011   PLT 58* 10/22/2011   GLUCOSE 94 10/22/2011   ALKPHOS 58 07/26/2011   ALT 29 07/26/2011   AST 13 07/26/2011   NA 135* 10/22/2011   K 4.0 10/22/2011   CL 102 10/22/2011   CREATININE 0.8 10/22/2011   BUN 6.0* 10/22/2011   CO2 23 10/22/2011   INR 1.09 04/10/2011    ASSESSMENT AND PLAN:   1. IgG lambda multiple myeloma:  -s/p 4 cycles of chemo Revlimid/[name]/Dex with very good partial response.  He is also s/p one auto BMT on 10/02/2011 at [facility].   - He is still recovering.  I will see him in about 1 month to see if his strength and  blood counts have recovered for Revlimid maintenance therapy.  I discussed with him the potential decrease in risk of recurrence and maybe even survival with maintenance therapy compared to observation.   2. Compression fracture: s/p kyphoplasty x2 and radiation with now much better pain control. Pain is now much better.  Will continue to wean off of OxoCodone in the future.   3. Anemia/thrombocytopenia: Due to recent BMT. There is no active bleeding; there is no indication for transfusion.  4. Mild calorie/protein malnutrition: He has decreased appetite and recent BMT. I advised him to increase his Ensure to but we can today. I couldn't endorse the use of Marinol at this time since the lung on chemotherapy. Eventually a few weeks after the transplant, his appetite will improve.   5.  Deconditioning:  From recent BMT. I advised him to slowly increase his exercise and hopefully in September, his stamina will get better  as well.  6 Follow up: in one month.      The length of time of the face-to-face encounter was 15 minutes. More than 50% of time was spent counseling and coordination of care.

## 2011-10-22 NOTE — Progress Notes (Signed)
Labs faxed to Gardiner Coins, RN at [facility] [phone].

## 2011-10-22 NOTE — Patient Instructions (Addendum)
1. IgG lambda multiple myeloma:  - 4 cycles of chemo Revlimid/[name]/Dex with very good partial response. You are also s/p one auto BMT on 10/02/2011 at [facility].  - You are still recovering. I will see you in about 1 month to see if you strength and blood counts have recovered for Revlimid maintenance therapy. We discussed the potential decrease in risk of recurrence and maybe even survival with maintenance therapy compared to observation.  2. Compression fracture: s/p kyphoplasty x2 and radiation with now much better pain control. Pain is now much better. Will continue to wean off of OxoCodone in the future.  3. Anemia/thrombocytopenia: Due to recent BMT. There is no active bleeding; there is no indication for transfusion.  4. Mild calorie/protein malnutrition: you have decreased appetite and recent BMT. Please increase Ensure to 3 cans daily. I do not recommend appetite stimulate at this time since you are not on chemotherapy anymore. Eventually a few weeks after the transplant, your appetite will improve.  5. Deconditioning: From recent BMT. Please slowly increase your exercise and hopefully in September, your stamina will get better as well.  6 Follow up: in one month.

## 2011-10-25 ENCOUNTER — Other Ambulatory Visit (HOSPITAL_BASED_OUTPATIENT_CLINIC_OR_DEPARTMENT_OTHER): Payer: Medicaid Other | Admitting: Lab

## 2011-10-25 DIAGNOSIS — C9000 Multiple myeloma not having achieved remission: Secondary | ICD-10-CM

## 2011-10-25 LAB — CBC WITH DIFFERENTIAL/PLATELET
BASO%: 0.5 % (ref 0.0–2.0)
EOS%: 2.6 % (ref 0.0–7.0)
HCT: 36.2 % — ABNORMAL LOW (ref 38.4–49.9)
LYMPH%: 55.1 % — ABNORMAL HIGH (ref 14.0–49.0)
MCH: 28.4 pg (ref 27.2–33.4)
MCHC: 33.7 g/dL (ref 32.0–36.0)
MCV: 84.4 fL (ref 79.3–98.0)
NEUT%: 28 % — ABNORMAL LOW (ref 39.0–75.0)
Platelets: 79 10*3/uL — ABNORMAL LOW (ref 140–400)

## 2011-10-25 LAB — MAGNESIUM (CC13): Magnesium: 2.2 mg/dl (ref 1.5–2.5)

## 2011-10-25 LAB — BASIC METABOLIC PANEL (CC13)
BUN: 7 mg/dL (ref 7.0–26.0)
Creatinine: 0.9 mg/dL (ref 0.7–1.3)
Glucose: 88 mg/dl (ref 70–99)

## 2011-10-30 ENCOUNTER — Other Ambulatory Visit: Payer: Self-pay | Admitting: Oncology

## 2011-11-01 ENCOUNTER — Encounter: Payer: Self-pay | Admitting: Dietician

## 2011-11-01 ENCOUNTER — Other Ambulatory Visit: Payer: Self-pay

## 2011-11-01 DIAGNOSIS — C9000 Multiple myeloma not having achieved remission: Secondary | ICD-10-CM

## 2011-11-01 MED ORDER — ESCITALOPRAM OXALATE 10 MG PO TABS: 10.0000 mg | ORAL_TABLET | Freq: Every day | ORAL | Status: DC

## 2011-11-01 NOTE — Progress Notes (Signed)
Brief Out-patient Oncology Nutrition Note  Reason: Patient Screened Positive For Nutrition Risk For Unintentional Weight Loss and Decreased Appetite.   Shane Woodard is a 51 year old male patient of Dr. Gaylyn Rong, diagnosed with multiple myeloma. Contacted patient via telephone for positive nutrition risk. A [place] phone interpreter was used for the encounter. The patient reported his appetite and intake are well. He does not drink any nutritional supplements.   Wt Readings from Last 10 Encounters:  10/22/11 143 lb 6.4 oz (65.046 kg)  07/26/11 156 lb (70.761 kg)  06/28/11 150 lb 1.6 oz (68.085 kg)  05/31/11 142 lb 3.2 oz (64.501 kg)  05/25/11 144 lb 4.8 oz (65.454 kg)  05/14/11 136 lb 12.8 oz (62.052 kg)  05/04/11 136 lb 14.4 oz (62.097 kg)  05/01/11 133 lb 4.8 oz (60.464 kg)  04/23/11 139 lb 9.6 oz (63.322 kg)  04/18/11 146 lb (66.225 kg)    I have educated the patient on high calorie, high protein diet. We discussed strategies to increase caloric intake. I have encouraged the patient to try Ensure or carnation instant breakfast to supplement meals when PO intake is poor. The patient was without any further nutrition related questions. Provided RD contact information and instructed patient to contact RD for future questions.   RD available for nutrition needs.   Iven Finn, MS, RD, LDN [phone]

## 2011-11-04 NOTE — Progress Notes (Signed)
Appointment was rescheduled.

## 2011-11-04 NOTE — Progress Notes (Signed)
Rescheduled

## 2011-11-05 ENCOUNTER — Other Ambulatory Visit: Payer: Self-pay | Admitting: *Deleted

## 2011-11-05 MED ORDER — OXYCODONE HCL 5 MG PO CAPS: 5.0000 mg | ORAL_CAPSULE | Freq: Four times a day (QID) | ORAL | Status: DC | PRN

## 2011-11-05 NOTE — Telephone Encounter (Signed)
VM from mother in law,  States pt needs refill on oxycodone.  Rx left in injection room for her to pick up.  Left her a VM informing her of Rx ready to pick up,  Call back if any questions.

## 2011-11-13 ENCOUNTER — Encounter (HOSPITAL_COMMUNITY): Payer: Self-pay | Admitting: Dentistry

## 2011-11-13 ENCOUNTER — Ambulatory Visit (HOSPITAL_COMMUNITY): Payer: Medicaid - Dental | Admitting: Dentistry

## 2011-11-13 VITALS — BP 107/65 | HR 112 | Temp 98.6°F

## 2011-11-13 DIAGNOSIS — K1370 Unspecified lesions of oral mucosa: Secondary | ICD-10-CM

## 2011-11-13 DIAGNOSIS — C9000 Multiple myeloma not having achieved remission: Secondary | ICD-10-CM

## 2011-11-13 DIAGNOSIS — Z463 Encounter for fitting and adjustment of dental prosthetic device: Secondary | ICD-10-CM

## 2011-11-13 DIAGNOSIS — Z972 Presence of dental prosthetic device (complete) (partial): Secondary | ICD-10-CM

## 2011-11-13 NOTE — Patient Instructions (Addendum)
Instructions for Denture Use and Care  Congratulations, you are on the way to oral rehabilitation!  You have just received a new set of complete or partial dentures.  These prostheses will help to improve both your appearance and chewing ability.  These instructions will help you get adjusted to your dentures as well as care for them properly.  Please read these instructions carefully and completely as soon as you get home.  If you or your caregiver have any questions please notify the [facility] at [phone].  HOW YOUR DENTURES LOOK AND FEEL Soon after you begin wearing your dentures, you may feel that your dentures are too large or even loose.  As our mouth and facial muscles become accustomed to the dentures, these feelings will go away.  You also may feel that you are salivating more than you normally do.  This feeling should go away as you get used to having the dentures in your mouth.  You may bite your cheek or your tongue; this will eventually resolve itself as you wear your dentures.  Some soreness is to be expected, but you should not hurt.  If your mouth hurts, call your dentist.  A denture adhesive may occasionally be necessary to hold your dentures in place more securely.  The dentist will let you know when one is recommended for you.  SPEAKING Wearing dentures will change the sound of your voice initially.  This will be noticed by you more than anyone else.  Bite and swallow before you speak, in order to place your dentures in position so that you may speak more clearly.  Practice speaking by reading aloud or counting from 1 to 100 very slowly and distinctly.  After some practice your mouth will become accustomed to your dentures and you will speak more clearly.  EATING Chewing will definitely be different after you receive your dentures.  With a little practice and patience you should be able to eat just about any kind of food.  Begin by eating small quantities of food  that are cut into small pieces.  Star with soft foods such as eggs, cooked vegetables, or puddings.  As you gain confidence advance  Your diet to whatever texture foods you can tolerate.  DENTURE CARE Dentures can collect plaque and calculus much the same as natural teeth can.  If not removed on a regular basis, your dentures will not look or feel clean, and you will experience denture odor.  It is very important that you remove your dentures at bedtime and clean them thoroughly.  You should: 1. Clean your dentures over a sink full of water so if dropped, breakage will be prevented. 2. Rinse your dentures with cool water to remove any large food particles. 3. Use soap and water or a denture cleanser or paste to clean the dentures.  Do not use regular toothpaste as it may abrade the denture base or teeth. 4. Use a moistened denture brush to clean all surfaces (inside and outside). 5. Rinse thoroughly to remove any remaining soap or denture cleanser. 6. Use a soft bristle toothbrush to gently brush any natural teeth, gums, tongue, and palate at bedtime and before reinserting your dentures. 7. Do not sleep with your dentures in your mouth at night.  Remove your dentures and soak them overnight in a denture cup filled with water or denture solution as recommended by your dentist.  This routine will become second nature and will increase the life and comfort   of your dentures.  Please do not try to adjust these dentures yourself; you could damage them.  FOLLOW-UP You should call or make an appointment with your dentist.  Your dentist would like to see you at least once a year for a check-up and examination. 

## 2011-11-13 NOTE — Progress Notes (Signed)
11/13/2011  Patient:            Gorje Albu Date of Birth:  Jul 22, 1960 MRN:                [id number]  BP: 139/87     P:  99        T: 98.5  [name] presents with interpreter for evaluation and adjustment of upper and lower complete dentures. Subjective: Patient is complaining of denture irritation to the maxillary frenulum area and several areas involving the lower denture. Patient was asked about smoking and patient indicates that he was smoking" sometimes". Patient indicates that the smoking helps increase his appetite. I discouraged use of cigarettes at this time. Exam: There is no evidence of denture irritation. Patient with generalized xerostomia and erythema. Procedure: Pressure indicating paste applied to dentures. Adjustments made as needed. [place]. Occlusion evaluated and adjustments made as needed for centric relation and protrusive stokes. Patient with tendency to protrude to end to end position. Patient again instructed on how to find maximum intercuspation position. Patient accepts results. Patient to keep dentures out if sore spots develop. Use salt water rinses as needed to aid healing. RTC as scheduled for denture adjustment. Call if problems arise before then. Will consider prior approval to Medicaid for denture relines at the next visit. Patient dismissed in stable condition. Dr. Cindra Eves

## 2011-11-21 ENCOUNTER — Telehealth: Payer: Self-pay | Admitting: Oncology

## 2011-11-21 ENCOUNTER — Other Ambulatory Visit (HOSPITAL_BASED_OUTPATIENT_CLINIC_OR_DEPARTMENT_OTHER): Payer: Medicaid Other | Admitting: Lab

## 2011-11-21 ENCOUNTER — Ambulatory Visit (HOSPITAL_BASED_OUTPATIENT_CLINIC_OR_DEPARTMENT_OTHER): Payer: Medicaid Other | Admitting: Oncology

## 2011-11-21 ENCOUNTER — Ambulatory Visit (HOSPITAL_COMMUNITY)
Admission: RE | Admit: 2011-11-21 | Discharge: 2011-11-21 | Disposition: A | Discharge status: Home / Self Care | Payer: Medicaid Other | Source: Ambulatory Visit | Attending: Oncology | Admitting: Oncology

## 2011-11-21 ENCOUNTER — Telehealth: Payer: Self-pay | Admitting: *Deleted

## 2011-11-21 VITALS — BP 108/76 | HR 105 | Temp 98.1°F | Resp 20 | Ht 65.0 in | Wt 148.3 lb

## 2011-11-21 DIAGNOSIS — R059 Cough, unspecified: Secondary | ICD-10-CM | POA: Insufficient documentation

## 2011-11-21 DIAGNOSIS — S32009A Unspecified fracture of unspecified lumbar vertebra, initial encounter for closed fracture: Secondary | ICD-10-CM | POA: Insufficient documentation

## 2011-11-21 DIAGNOSIS — D72829 Elevated white blood cell count, unspecified: Secondary | ICD-10-CM | POA: Insufficient documentation

## 2011-11-21 DIAGNOSIS — R05 Cough: Secondary | ICD-10-CM

## 2011-11-21 DIAGNOSIS — D696 Thrombocytopenia, unspecified: Secondary | ICD-10-CM

## 2011-11-21 DIAGNOSIS — J9819 Other pulmonary collapse: Secondary | ICD-10-CM | POA: Insufficient documentation

## 2011-11-21 DIAGNOSIS — S22009A Unspecified fracture of unspecified thoracic vertebra, initial encounter for closed fracture: Secondary | ICD-10-CM | POA: Insufficient documentation

## 2011-11-21 DIAGNOSIS — D649 Anemia, unspecified: Secondary | ICD-10-CM

## 2011-11-21 DIAGNOSIS — C9000 Multiple myeloma not having achieved remission: Secondary | ICD-10-CM

## 2011-11-21 DIAGNOSIS — X58XXXA Exposure to other specified factors, initial encounter: Secondary | ICD-10-CM | POA: Insufficient documentation

## 2011-11-21 LAB — CBC WITH DIFFERENTIAL/PLATELET
BASO%: 0 % (ref 0.0–2.0)
LYMPH%: 17.6 % (ref 14.0–49.0)
MCHC: 32.7 g/dL (ref 32.0–36.0)
MONO#: 1.2 10*3/uL — ABNORMAL HIGH (ref 0.1–0.9)
Platelets: 131 10*3/uL — ABNORMAL LOW (ref 140–400)
RBC: 3.66 10*6/uL — ABNORMAL LOW (ref 4.20–5.82)
WBC: 22 10*3/uL — ABNORMAL HIGH (ref 4.0–10.3)
lymph#: 3.9 10*3/uL — ABNORMAL HIGH (ref 0.9–3.3)

## 2011-11-21 LAB — COMPREHENSIVE METABOLIC PANEL (CC13)
ALT: 9 U/L (ref 0–55)
CO2: 25 mEq/L (ref 22–29)
Sodium: 137 mEq/L (ref 136–145)
Total Bilirubin: 0.3 mg/dL (ref 0.20–1.20)
Total Protein: 7.1 g/dL (ref 6.4–8.3)

## 2011-11-21 MED ORDER — PROCHLORPERAZINE MALEATE 10 MG PO TABS: 10.0000 mg | ORAL_TABLET | Freq: Four times a day (QID) | ORAL | Status: DC | PRN

## 2011-11-21 MED ORDER — OXYCODONE HCL 5 MG PO CAPS: 5.0000 mg | ORAL_CAPSULE | Freq: Four times a day (QID) | ORAL | Status: DC | PRN

## 2011-11-21 NOTE — Patient Instructions (Signed)
1.  Diagnosis:  Multiple myeloma. 2.  Treatment:  Starting maintenance Revlimid (without Dexamethasone) in October 2014.   Need to delay until seen by [facility] on 12/03/2011 and also to get over the current pneumonia. 3.  Cough:  Need to make sure there is no pneumonia.  Please obtain chest Xray today.  4.  Follow up:  In about 6-8 weeks.

## 2011-11-21 NOTE — Telephone Encounter (Signed)
Printed and gv appt schedule to pt.

## 2011-11-21 NOTE — Progress Notes (Signed)
[facility]  Telephone:(336) [phone] Fax:(336) [phone]   OFFICE PROGRESS NOTE   Cc:  HUSAIN,KARRAR, MD  DIAGNOSIS: IgG lambda multiple myeloma; presented with anemia, lytic bone lesions. Initial M-spike was 5.1gm/dL; free serum lambda of 2.59 mg/dL; Ig G 7760 mg/dL; [id number] of 2.47. Bone marrow biopsy showed 35% plasma cell; cytogenetics per FISH was positive for t(11;14).   PAST THERAPY: started on 03/19/11 [name] 1.3mg /m2 SQ d1, 4,8,11 q28 day; Revlimid 25 mg PO d1-21 q28day; Dexamethasone 40mg  PO weekly (even of week off of chemo). He was also on Acyclovir 400mg  PO BID; Bactrim DS Mon/Wed/Fri; Lovenox 40mg  SQ daily. He was also on qmonth Zometa for bone protection. He is s/p autologous BMT at [facility] on 10/02/2011.   INTERVAL HISTORY: Shane Woodard 51 y.o. male returns for regular follow up with his mother-in-law and a [place] Nurse, learning disability.  He reports feeling relatively well.  He has been having productive cough with yellow sputum this last week.  His cough is without fever, pleurisy, chest pain, SOB, DOE.  He still smokes about 2-3 cigarettes/day.  He is trying to quit with electronic cigarettes the next few weeks.  His strength is improved compared to immediate post BMT.  He is able to grocery shop, cook, clean, and ambulate about everyday.  He has mild back pain, especially as the day go on.  The level of pain is no where compared to at diagnosis.  He denied leg weakness, paresthesia, bowel/bladder incontinence.  He has dark skin after transplant; however, no active skin rash. His denture does not fit well; thus, he is hoping to get it adjusted again.   Patient denies fever, anorexia, weight loss, fatigue, headache, visual changes, confusion, drenching night sweats, palpable lymph node swelling, mucositis, odynophagia, dysphagia, nausea vomiting, jaundice, chest pain, palpitation, gum bleeding, epistaxis, hematemesis, hemoptysis, abdominal pain, abdominal  swelling, early satiety, melena, hematochezia, hematuria, spontaneous bleeding, joint swelling, joint pain, heat or cold intolerance, depression, suicidal or homicidal ideation, feeling hopelessness.   Past Medical History  Diagnosis Date  . Anemia   . Arthritis   . Multiple myeloma   . S/P radiation therapy 05/03/11 - 05/04/11    Right Inferior/Anterior Rib Cage, Left Inferior/Anterior Rib Cage and Left Scapula/Shoulder  . Maintenance antineoplastic chemotherapy     [name], [name]  . S/P bone marrow transplant     [facility]    Past Surgical History  Procedure Date  . Left thumb surgery 11/26/2003  . Multiple extractions with alveoloplasty 03/28/2011    Procedure: MULTIPLE EXTRACION WITH ALVEOLOPLASTY;  Surgeon: Charlynne Pander, DDS;  Location: [facility];  Service: Oral Surgery;  Laterality: N/A;  Extraction of tooth #'s 2,3,4,5,6,7,8,9,10,11,12,13,14,15,17, 20,21,22,23,24,25,26,27,28, 31, and 32 with alveoloplasty and mandibular left torus reduction.    Current Outpatient Prescriptions  Medication Sig Dispense Refill  . acyclovir (ZOVIRAX) 800 MG tablet Take 800 mg by mouth 2 (two) times daily. Help prevent Viral infection      . cyclobenzaprine (FLEXERIL) 5 MG tablet Take 5 mg by mouth 3 (three) times daily as needed. As needed for muscle spasms      . docusate sodium (COLACE) 100 MG capsule Take 100 mg by mouth 3 (three) times daily as needed. Take as needed for constipation      . escitalopram (LEXAPRO) 10 MG tablet Take 1 tablet (10 mg total) by mouth daily.  30 tablet  2  . folic acid (FOLVITE) 1 MG tablet Take 1 mg by mouth daily.      Marland Kitchen  levofloxacin (LEVAQUIN) 750 MG tablet Take 750 mg by mouth daily. Helps prevent infection.      . ondansetron (ZOFRAN) 8 MG tablet TAKE 1 TABLET BY MOUTH EVERY 8 HOURS AS NEEDED TO PREVENT OR TREAT NAUSEA  30 tablet  5  . oxycodone (OXY-IR) 5 MG capsule Take 1 capsule (5 mg total) by mouth every 6 (six) hours as needed for  pain. As needed for pain  90 capsule  0  . prochlorperazine (COMPAZINE) 10 MG tablet Take 1 tablet (10 mg total) by mouth every 6 (six) hours as needed.  30 tablet  2  . sulfamethoxazole-trimethoprim (BACTRIM DS,SEPTRA DS) 800-160 MG per tablet Take 1 tablet by mouth 3 (three) times a week. Take one table on Monday, Wednesday and Friday.  Helps prevent Infection.      Marland Kitchen DISCONTD: prochlorperazine (COMPAZINE) 10 MG tablet TAKE 1 TABLET BY MOUTH EVERY 6 HOURS AS NEEDED FOR NAUSEA/VOMITING  30 tablet  1    ALLERGIES:   has no known allergies.  REVIEW OF SYSTEMS:  The rest of the 14-point review of system was negative.   Filed Vitals:   11/21/11 1445  BP: 108/76  Pulse: 105  Temp: 98.1 F (36.7 C)  Resp: 20   Wt Readings from Last 3 Encounters:  11/21/11 148 lb 4.8 oz (67.268 kg)  10/22/11 143 lb 6.4 oz (65.046 kg)  07/26/11 156 lb (70.761 kg)   ECOG Performance status: 1  PHYSICAL EXAMINATION:   General:  well-nourished man,  in no acute distress.  Eyes:  no scleral icterus.  ENT:  There were no oropharyngeal lesions.  Neck was without thyromegaly.  Lymphatics:  Negative cervical, supraclavicular or axillary adenopathy.  Respiratory: lungs were clear bilaterally without wheezing or crackles.  Cardiovascular:  Regular rate and rhythm, S1/S2, without murmur, rub or gallop.  There was no pedal edema.  GI:  abdomen was soft, flat, nontender, nondistended, without organomegaly.  Muscoloskeletal:  no spinal tenderness of palpation of vertebral spine.  Skin exam was without echymosis, petichae.  Neuro exam was nonfocal.  Patient was able to get on and off exam table without assistance.  Gait was normal.  Patient was alerted and oriented.  Attention was good.   Language was appropriate.  Mood was normal without depression.  Speech was not pressured.  Thought content was not tangential.     LABORATORY/RADIOLOGY DATA:  Lab Results  Component Value Date   WBC 22.0* 11/21/2011   HGB 11.0*  11/21/2011   HCT 33.5* 11/21/2011   PLT 131* 11/21/2011   GLUCOSE 108* 11/21/2011   ALKPHOS 123 11/21/2011   ALT 9 11/21/2011   AST 13 11/21/2011   NA 137 11/21/2011   K 4.2 11/21/2011   CL 104 11/21/2011   CREATININE 1.0 11/21/2011   BUN 7.0 11/21/2011   CO2 25 11/21/2011   INR 1.09 04/10/2011   IMAGING:  I personally reviewed the following CXR.    Dg Chest 2 View  11/21/2011  *RADIOLOGY REPORT*  Clinical Data: Cough, leukocytosis.  CHEST - 2 VIEW  Comparison: 04/01/2011  Findings: Linear subsegmental atelectasis at the right lung base. Left lung is clear.  Heart is normal size.  No effusions or acute bony abnormality.  Multilevel compression fractures in the lower thoracic spine and upper lumbar spine.  Multilevel vertebral augmentation changes noted.  IMPRESSION: Right basilar atelectasis.  Otherwise no acute findings.   Original Report Authenticated By: Cyndie Chime, M.D.        ASSESSMENT  AND PLAN:   1. IgG lambda multiple myeloma:  -s/p 4 cycles of chemo Revlimid/[name]/Dex with very good partial response. He is also s/p one auto BMT on 10/02/2011 at [facility].  He has follow up with them on 12/03/2011  - He has recovered very well from BMT.  I explained to him and his relatives the potential benefit of decreased risk of recurrence of his myeloma and potential improved survival with maintenance Revlimid 15mg  PO daily (d1-21 of every 4 weeks) as compared to observation alone. He expressed informed understanding and wished to proceed.  I will await his BMT follow up on 12/03/11 before stating this.   2. Compression fracture: s/p kyphoplasty x2 and radiation with now much better pain control. Pain is now much better. He is taking Oxycodone about 1-2  A day.   3.  Cough with esonophelia:  Most likely allergic bronchitis.  CXR showed no infiltrate.  He is already on prophylactic antibiotic Levaquin from BMT.  There is no indication to switch antibiotic.   4. Anemia/thrombocytopenia: Due to recent  BMT.  It is slowly improving. There is no active bleeding; there is no indication for transfusion.   5. Mild calorie/protein malnutrition:  This is also improved.  He has been gaining weight.   6. Deconditioning: From recent BMT.  He is almost back to baseline.   7.  Follow up: in 6-8 weeks.      The length of time of the face-to-face encounter was 15   minutes. More than 50% of time was spent counseling and coordination of care.

## 2011-11-21 NOTE — Telephone Encounter (Signed)
Message copied by Wende Mott on Wed Nov 21, 2011  5:06 PM ------      Message from: HA, Raliegh Ip T      Created: Wed Nov 21, 2011  5:00 PM       Please call pt (or have Jamelle Haring call him).  His CXR was normal.  His cough must be from bronchitis.  Continue Levaquin as prescribed by [facility] B[facility].  Thanks.

## 2011-11-21 NOTE — Telephone Encounter (Signed)
Called Snow and left VM asking her to relay Dr. Lodema Pilot message below and to please call back to confirm.

## 2011-11-24 LAB — PROTEIN ELECTROPHORESIS, SERUM
Beta 2: 3.9 % (ref 3.2–6.5)
Beta Globulin: 4.6 % — ABNORMAL LOW (ref 4.7–7.2)
Gamma Globulin: 18 % (ref 11.1–18.8)
M-Spike, %: 0.97 g/dL
Total Protein, Serum Electrophoresis: 6.8 g/dL (ref 6.0–8.3)

## 2011-11-26 ENCOUNTER — Other Ambulatory Visit: Payer: Self-pay

## 2011-11-26 MED ORDER — LEVOFLOXACIN 750 MG PO TABS: 750.0000 mg | ORAL_TABLET | Freq: Every day | ORAL | Status: DC

## 2011-11-28 ENCOUNTER — Encounter (HOSPITAL_COMMUNITY): Payer: Self-pay | Admitting: Dentistry

## 2011-11-29 ENCOUNTER — Ambulatory Visit (HOSPITAL_COMMUNITY): Payer: Medicaid - Dental | Admitting: Dentistry

## 2011-11-29 ENCOUNTER — Encounter (HOSPITAL_COMMUNITY): Payer: Self-pay | Admitting: Dentistry

## 2011-11-29 VITALS — BP 111/67 | HR 105 | Temp 98.3°F

## 2011-11-29 DIAGNOSIS — Z972 Presence of dental prosthetic device (complete) (partial): Secondary | ICD-10-CM

## 2011-11-29 DIAGNOSIS — Z463 Encounter for fitting and adjustment of dental prosthetic device: Secondary | ICD-10-CM

## 2011-11-29 DIAGNOSIS — K062 Gingival and edentulous alveolar ridge lesions associated with trauma: Secondary | ICD-10-CM

## 2011-11-29 DIAGNOSIS — K1370 Unspecified lesions of oral mucosa: Secondary | ICD-10-CM

## 2011-11-29 NOTE — Progress Notes (Signed)
11/29/2011  Patient:            Shane Woodard Date of Birth:  1960-12-16 MRN:                [id number]  BP 111/67  Pulse 105  Temp 98.3 F (36.8 C) (Oral)  Shane Woodard presents with interpreter for evaluation and adjustment of upper and lower complete dentures. Subjective: Patient is complaining of denture irritation in several areas of the upper and lower dentures. Exam: There is no evidence of denture irritation. Patient with generalized xerostomia and erythema. Procedure: Pressure indicating paste applied to dentures. Adjustments made as needed. [place]. Occlusion evaluated and adjustments made as needed for centric relation and protrusive stokes. Patient able to find maximum intercuspation easily today.  Patient accepts results. Patient to keep dentures out if sore spots develop. Use salt water rinses as needed to aid healing. RTC as scheduled for denture adjustment. Call if problems arise before then. Patient dismissed in stable condition. Dr. Cindra Eves

## 2011-12-04 NOTE — Progress Notes (Signed)
Received office notes from [facility]; forwarded to Dr. Gaylyn Rong.

## 2011-12-04 NOTE — Progress Notes (Signed)
Received call from Dr. Marlaine Hind, Oncologist @ [facility] stating that he had patient to discontinue Levaquin and Bactrim; orders for lab work faxed also.

## 2011-12-05 ENCOUNTER — Telehealth: Payer: Self-pay | Admitting: Oncology

## 2011-12-05 ENCOUNTER — Other Ambulatory Visit: Payer: Self-pay | Admitting: Oncology

## 2011-12-05 ENCOUNTER — Other Ambulatory Visit: Payer: Self-pay | Admitting: *Deleted

## 2011-12-05 ENCOUNTER — Telehealth: Payer: Self-pay | Admitting: *Deleted

## 2011-12-05 DIAGNOSIS — C9000 Multiple myeloma not having achieved remission: Secondary | ICD-10-CM

## 2011-12-05 MED ORDER — CYCLOBENZAPRINE HCL 5 MG PO TABS: 5.0000 mg | ORAL_TABLET | Freq: Three times a day (TID) | ORAL | Status: DC | PRN

## 2011-12-05 NOTE — Telephone Encounter (Signed)
Per staff message and POF I have scheduled appts.  JMW  

## 2011-12-05 NOTE — Telephone Encounter (Signed)
s.w. pt and advised on OCT appts

## 2011-12-07 ENCOUNTER — Other Ambulatory Visit (HOSPITAL_BASED_OUTPATIENT_CLINIC_OR_DEPARTMENT_OTHER): Payer: Medicaid Other | Admitting: Lab

## 2011-12-07 ENCOUNTER — Encounter: Payer: Self-pay | Admitting: *Deleted

## 2011-12-07 DIAGNOSIS — C9000 Multiple myeloma not having achieved remission: Secondary | ICD-10-CM

## 2011-12-07 LAB — CBC WITH DIFFERENTIAL/PLATELET
Basophils Absolute: 0 10*3/uL (ref 0.0–0.1)
Eosinophils Absolute: 9.6 10*3/uL — ABNORMAL HIGH (ref 0.0–0.5)
HGB: 10 g/dL — ABNORMAL LOW (ref 13.0–17.1)
LYMPH%: 22.6 % (ref 14.0–49.0)
MCV: 95.1 fL (ref 79.3–98.0)
MONO%: 4.8 % (ref 0.0–14.0)
NEUT#: 3.7 10*3/uL (ref 1.5–6.5)
NEUT%: 20.3 % — ABNORMAL LOW (ref 39.0–75.0)
Platelets: 115 10*3/uL — ABNORMAL LOW (ref 140–400)

## 2011-12-07 NOTE — Progress Notes (Signed)
CBC results faxed to [facility] at fax# [phone].

## 2011-12-10 NOTE — Progress Notes (Signed)
Faxed lab results to [facility].

## 2011-12-17 ENCOUNTER — Other Ambulatory Visit (HOSPITAL_BASED_OUTPATIENT_CLINIC_OR_DEPARTMENT_OTHER): Payer: Medicaid Other | Admitting: Lab

## 2011-12-17 ENCOUNTER — Encounter: Payer: Self-pay | Admitting: *Deleted

## 2011-12-17 DIAGNOSIS — C9000 Multiple myeloma not having achieved remission: Secondary | ICD-10-CM

## 2011-12-17 LAB — CBC WITH DIFFERENTIAL/PLATELET
BASO%: 0.1 % (ref 0.0–2.0)
EOS%: 63.2 % — ABNORMAL HIGH (ref 0.0–7.0)
Eosinophils Absolute: 13.9 10*3/uL — ABNORMAL HIGH (ref 0.0–0.5)
LYMPH%: 16.1 % (ref 14.0–49.0)
MCH: 31.8 pg (ref 27.2–33.4)
MCHC: 33 g/dL (ref 32.0–36.0)
MCV: 96.5 fL (ref 79.3–98.0)
MONO%: 2.9 % (ref 0.0–14.0)
Platelets: 115 10*3/uL — ABNORMAL LOW (ref 140–400)
RBC: 3.28 10*6/uL — ABNORMAL LOW (ref 4.20–5.82)

## 2011-12-17 NOTE — Progress Notes (Signed)
Faxed CBC results to [facility] at fax [phone].

## 2011-12-18 ENCOUNTER — Other Ambulatory Visit: Payer: Self-pay | Admitting: *Deleted

## 2011-12-18 ENCOUNTER — Telehealth: Payer: Self-pay | Admitting: *Deleted

## 2011-12-18 DIAGNOSIS — C9000 Multiple myeloma not having achieved remission: Secondary | ICD-10-CM

## 2011-12-18 MED ORDER — OXYCODONE HCL 5 MG PO CAPS: 5.0000 mg | ORAL_CAPSULE | Freq: Four times a day (QID) | ORAL | Status: DC | PRN

## 2011-12-18 NOTE — Telephone Encounter (Signed)
Received message from Isaias Cowman requesting refill of Oxycodone for pt.   Spoke with Darel Hong and was informed that she is pt's mother in-law, and handles all pt's medications.   Darel Hong stated pt needed refill of Oxycodone 1 tab po q 6 hrs prn ; however, pt takes  2 - 3 tabs daily.   Darel Hong stated pt has enough pain meds through Friday, but Darel Hong will be going out of town, and she needed to pick up refill rx for pt before leaving. RN looked at release of info signed by pt -  Judy's name was not on the signed list. Spoke with Darel Hong again, and informed her that message will be relayed to md on 12/19/11  -  As both  md and his NP were not in office today; and pt not out of pain meds. Instructed Darel Hong to have pt add her name to list of release of info - due to strict HIPPA law.   Informed Darel Hong that the names on release of info could pick up pt's meds with proper ID.  Darel Hong voiced understanding and stated she would have her husband pick up rx  On 12/19/11. Judy's  Phone    [phone].

## 2011-12-19 ENCOUNTER — Telehealth: Payer: Self-pay | Admitting: *Deleted

## 2011-12-19 NOTE — Telephone Encounter (Signed)
Called pt's mother in law to notify of Rx for oxycodone ready to pick up.  She verbalized understanding.

## 2011-12-24 ENCOUNTER — Other Ambulatory Visit (HOSPITAL_BASED_OUTPATIENT_CLINIC_OR_DEPARTMENT_OTHER): Payer: Medicaid Other | Admitting: Lab

## 2011-12-24 DIAGNOSIS — C9000 Multiple myeloma not having achieved remission: Secondary | ICD-10-CM

## 2011-12-24 LAB — CBC WITH DIFFERENTIAL/PLATELET
BASO%: 0 % (ref 0.0–2.0)
Eosinophils Absolute: 17.1 10*3/uL — ABNORMAL HIGH (ref 0.0–0.5)
HCT: 31.3 % — ABNORMAL LOW (ref 38.4–49.9)
MCHC: 33 g/dL (ref 32.0–36.0)
MONO#: 1.2 10*3/uL — ABNORMAL HIGH (ref 0.1–0.9)
NEUT#: 3.1 10*3/uL (ref 1.5–6.5)
NEUT%: 12.8 % — ABNORMAL LOW (ref 39.0–75.0)
Platelets: 127 10*3/uL — ABNORMAL LOW (ref 140–400)
WBC: 24.4 10*3/uL — ABNORMAL HIGH (ref 4.0–10.3)
lymph#: 3 10*3/uL (ref 0.9–3.3)

## 2012-01-02 ENCOUNTER — Encounter: Payer: Self-pay | Admitting: Oncology

## 2012-01-02 ENCOUNTER — Ambulatory Visit (HOSPITAL_BASED_OUTPATIENT_CLINIC_OR_DEPARTMENT_OTHER): Payer: Medicaid Other | Admitting: Oncology

## 2012-01-02 ENCOUNTER — Other Ambulatory Visit (HOSPITAL_BASED_OUTPATIENT_CLINIC_OR_DEPARTMENT_OTHER): Payer: Medicaid Other | Admitting: Lab

## 2012-01-02 ENCOUNTER — Ambulatory Visit (HOSPITAL_BASED_OUTPATIENT_CLINIC_OR_DEPARTMENT_OTHER): Payer: Medicaid Other

## 2012-01-02 ENCOUNTER — Telehealth: Payer: Self-pay | Admitting: Oncology

## 2012-01-02 VITALS — BP 112/74 | HR 83 | Temp 96.9°F | Resp 20 | Wt 155.6 lb

## 2012-01-02 DIAGNOSIS — C9000 Multiple myeloma not having achieved remission: Secondary | ICD-10-CM

## 2012-01-02 DIAGNOSIS — D649 Anemia, unspecified: Secondary | ICD-10-CM

## 2012-01-02 DIAGNOSIS — Z5112 Encounter for antineoplastic immunotherapy: Secondary | ICD-10-CM

## 2012-01-02 DIAGNOSIS — D72829 Elevated white blood cell count, unspecified: Secondary | ICD-10-CM

## 2012-01-02 DIAGNOSIS — D696 Thrombocytopenia, unspecified: Secondary | ICD-10-CM

## 2012-01-02 DIAGNOSIS — R05 Cough: Secondary | ICD-10-CM

## 2012-01-02 LAB — CBC WITH DIFFERENTIAL/PLATELET
Basophils Absolute: 0.1 10*3/uL (ref 0.0–0.1)
HCT: 33.8 % — ABNORMAL LOW (ref 38.4–49.9)
HGB: 10.7 g/dL — ABNORMAL LOW (ref 13.0–17.1)
LYMPH%: 19 % (ref 14.0–49.0)
MCH: 30.9 pg (ref 27.2–33.4)
MONO#: 0.9 10*3/uL (ref 0.1–0.9)
NEUT%: 7.8 % — ABNORMAL LOW (ref 39.0–75.0)
Platelets: 210 10*3/uL (ref 140–400)
WBC: 21.5 10*3/uL — ABNORMAL HIGH (ref 4.0–10.3)
lymph#: 4.1 10*3/uL — ABNORMAL HIGH (ref 0.9–3.3)

## 2012-01-02 LAB — COMPREHENSIVE METABOLIC PANEL (CC13)
Albumin: 3.2 g/dL — ABNORMAL LOW (ref 3.5–5.0)
BUN: 7 mg/dL (ref 7.0–26.0)
Calcium: 9.1 mg/dL (ref 8.4–10.4)
Chloride: 109 mEq/L — ABNORMAL HIGH (ref 98–107)
Glucose: 102 mg/dl — ABNORMAL HIGH (ref 70–99)
Potassium: 3.2 mEq/L — ABNORMAL LOW (ref 3.5–5.1)

## 2012-01-02 MED ORDER — SODIUM CHLORIDE 0.9 % IV SOLN
Freq: Once | INTRAVENOUS | Status: AC
  Administered 2012-01-02: 12:00:00 via INTRAVENOUS

## 2012-01-02 MED ORDER — ESCITALOPRAM OXALATE 10 MG PO TABS
10.0000 mg | ORAL_TABLET | Freq: Every day | ORAL | Status: DC
Start: 2012-01-02 — End: 2012-04-28

## 2012-01-02 MED ORDER — ZOLEDRONIC ACID 4 MG/5ML IV CONC
4.0000 mg | Freq: Once | INTRAVENOUS | Status: AC
  Administered 2012-01-02: 4 mg via INTRAVENOUS
  Filled 2012-01-02: qty 5

## 2012-01-02 MED ORDER — PROCHLORPERAZINE MALEATE 10 MG PO TABS: 10.0000 mg | ORAL_TABLET | Freq: Four times a day (QID) | ORAL | Status: AC | PRN

## 2012-01-02 NOTE — Progress Notes (Signed)
[facility]  Telephone:([phone]) [phone] Fax:([phone]) [phone]   OFFICE PROGRESS NOTE   Cc:  HUSAIN,KARRAR, MD  DIAGNOSIS: IgG lambda multiple myeloma; presented with anemia, lytic bone lesions. Initial M-spike was 5.1gm/dL; free serum lambda of 4.54 mg/dL; Ig G 7760 mg/dL; UJWJ-1-[id number] of 2.47. Bone marrow biopsy showed 35% plasma cell; cytogenetics per FISH was positive for t(11;14).   PAST THERAPY: started on 03/19/11 [name] 1.3mg /m2 SQ d1, 4,8,11 q28 day; Revlimid 25 mg PO d1-21 q28day; Dexamethasone 40mg  PO weekly (even of week off of chemo). He was also on Acyclovir 400mg  PO BID; Bactrim DS Mon/Wed/Fri; Lovenox 40mg  SQ daily. He was also on qmonth Zometa for bone protection. He is s/p autologous BMT at [facility] on 10/02/2011.   CURRENT THERAPY:  Revlimid 10 mg daily per [facility].  INTERVAL HISTORY: Shane Woodard 51 y.o. male returns for regular follow up with his mother-in-law and a [place] Nurse, learning disability.  He reports feeling relatively well. He still smokes about 2-3 cigarettes/day.  He is trying to quit with electronic cigarettes the next few weeks.  His strength is improved compared to immediate post BMT.  He is able to grocery shop, cook, clean, and ambulate about everyday.  He has mild back pain, especially as the day go on.  The level of pain is no where compared to at diagnosis. Using Oxycodone as needed about 2-3 times per day. He denied leg weakness, paresthesia, bowel/bladder incontinence. He has been itching and [facility] stopped his Levaquin and Bactrim. Itching has not really improved. He has dry skin as well. Reports loose stools. Not taking anything for this problem.   Patient denies fever, anorexia, weight loss, fatigue, headache, visual changes, confusion, drenching night sweats, palpable lymph node swelling, mucositis, odynophagia, dysphagia, nausea vomiting, jaundice, chest pain, palpitation, gum bleeding, epistaxis, hematemesis, hemoptysis, abdominal pain,  abdominal swelling, early satiety, melena, hematochezia, hematuria, spontaneous bleeding, joint swelling, joint pain, heat or cold intolerance, depression, suicidal or homicidal ideation, feeling hopelessness.   Past Medical History  Diagnosis Date  . Anemia   . Arthritis   . Multiple myeloma(203.0)   . S/P radiation therapy 05/03/11 - 05/04/11    Right Inferior/Anterior Rib Cage, Left Inferior/Anterior Rib Cage and Left Scapula/Shoulder  . Maintenance antineoplastic chemotherapy     [name], [name]  . S/P bone marrow transplant     [facility]    Past Surgical History  Procedure Date  . Left thumb surgery 11/26/2003  . Multiple extractions with alveoloplasty 03/28/2011    Procedure: MULTIPLE EXTRACION WITH ALVEOLOPLASTY;  Surgeon: Charlynne Pander, DDS;  Location: [facility] ORS;  Service: Oral Surgery;  Laterality: N/A;  Extraction of tooth #'s 2,3,4,5,6,7,8,9,10,11,12,13,14,15,17, 20,21,22,23,24,25,26,27,28, 31, and 32 with alveoloplasty and mandibular left torus reduction.    Current Outpatient Prescriptions  Medication Sig Dispense Refill  . acyclovir (ZOVIRAX) 800 MG tablet Take 800 mg by mouth 2 (two) times daily. Help prevent Viral infection      . aspirin 325 MG tablet Take 325 mg by mouth daily.      . cyclobenzaprine (FLEXERIL) 5 MG tablet Take 1 tablet (5 mg total) by mouth 3 (three) times daily as needed. As needed for muscle spasms  90 tablet  0  . docusate sodium (COLACE) 100 MG capsule Take 100 mg by mouth 3 (three) times daily as needed. Take as needed for constipation      . escitalopram (LEXAPRO) 10 MG tablet Take 1 tablet (10 mg total) by mouth daily.  30 tablet  2  . folic acid (FOLVITE) 1 MG tablet Take 1 mg by mouth daily.      Marland Kitchen lenalidomide (REVLIMID) 5 MG capsule Take 10 mg by mouth daily.      . ondansetron (ZOFRAN) 8 MG tablet TAKE 1 TABLET BY MOUTH EVERY 8 HOURS AS NEEDED TO PREVENT OR TREAT NAUSEA  30 tablet  5  . oxycodone (OXY-IR) 5 MG  capsule Take 1 capsule (5 mg total) by mouth every 6 (six) hours as needed for pain. As needed for pain  90 capsule  0  . prochlorperazine (COMPAZINE) 10 MG tablet Take 1 tablet (10 mg total) by mouth every 6 (six) hours as needed.  30 tablet  2  . [DISCONTINUED] escitalopram (LEXAPRO) 10 MG tablet Take 1 tablet (10 mg total) by mouth daily.  30 tablet  2  . [DISCONTINUED] prochlorperazine (COMPAZINE) 10 MG tablet Take 1 tablet (10 mg total) by mouth every 6 (six) hours as needed.  30 tablet  2   No current facility-administered medications for this visit.   Facility-Administered Medications Ordered in Other Visits  Medication Dose Route Frequency Provider Last Rate Last Dose  . [COMPLETED] 0.9 %  sodium chloride infusion   Intravenous Once Exie Parody, MD      . [address] zolendronic acid (ZOMETA) 4 mg in sodium chloride 0.9 % 100 mL IVPB  4 mg Intravenous Once Exie Parody, MD   4 mg at 01/02/12 1148    ALLERGIES:   has no known allergies.  REVIEW OF SYSTEMS:  The rest of the 14-point review of system was negative.   Filed Vitals:   01/02/12 1304  BP: 112/74  Pulse: 83  Temp: 96.9 F (36.1 C)  Resp: 20   Wt Readings from Last 3 Encounters:  01/02/12 155 lb 9.6 oz (70.58 kg)  11/21/11 148 lb 4.8 oz (67.268 kg)  10/22/11 143 lb 6.4 oz (65.046 kg)   ECOG Performance status: 1  PHYSICAL EXAMINATION:   General:  well-nourished man,  in no acute distress.  Eyes:  no scleral icterus.  ENT:  There were no oropharyngeal lesions.  Neck was without thyromegaly.  Lymphatics:  Negative cervical, supraclavicular or axillary adenopathy.  Respiratory: lungs were clear bilaterally without wheezing or crackles.  Cardiovascular:  Regular rate and rhythm, S1/S2, without murmur, rub or gallop.  There was no pedal edema.  GI:  abdomen was soft, flat, nontender, nondistended, without organomegaly.  Muscoloskeletal:  no spinal tenderness of palpation of vertebral spine.  Skin exam was without echymosis,  petichae. No rashes. Patient has dry skin. Neuro exam was nonfocal.  Patient was able to get on and off exam table without assistance.  Gait was normal.  Patient was alerted and oriented.  Attention was good.   Language was appropriate.  Mood was normal without depression.  Speech was not pressured.  Thought content was not tangential.     LABORATORY/RADIOLOGY DATA:  Lab Results  Component Value Date   WBC 21.5* 01/02/2012   HGB 10.7* 01/02/2012   HCT 33.8* 01/02/2012   PLT 210 01/02/2012   GLUCOSE 108* 11/21/2011   ALKPHOS 123 11/21/2011   ALT 9 11/21/2011   AST 13 11/21/2011   NA 137 11/21/2011   K 4.2 11/21/2011   CL 104 11/21/2011   CREATININE 1.0 11/21/2011   BUN 7.0 11/21/2011   CO2 25 11/21/2011   INR 1.09 04/10/2011   ASSESSMENT AND PLAN:   1. IgG lambda multiple myeloma:  -s/p 4  cycles of chemo Revlimid/[name]/Dex with very good partial response. He is also s/p one auto BMT on 10/02/2011 at [facility].  He has follow up with them on 01/07/12. He is on Revlimid 10 mg daily per [facility].   2. Compression fracture: s/p kyphoplasty x2 and radiation with now much better pain control. Pain is now much better. He is taking Oxycodone about 1-2 a day. Will resume Zometa 4 mg IV monthly per [facility].  3. Leukocytosis: Unclear etiology. No fevers or other signs of infection. Will monitor this.  4. Anemia/thrombocytopenia: Due to recent BMT.  It is slowly improving. There is no active bleeding; there is no indication for transfusion.   5. Mild calorie/protein malnutrition:  This is also improved.  He has been gaining weight.   6. Deconditioning: From recent BMT.  He is almost back to baseline.   7.  Follow up: In 6 months for visit. He will continue to have a monthly BMET and Zometa. He will let us know if [facility] is not seeing him routinely and we can see him sooner.     The length of time of the face-to-face encounter was 25   minutes. More than 50% of time was spent counseling and coordination of  care.

## 2012-01-02 NOTE — Patient Instructions (Addendum)
Diarrhea:  Imodium (available over the counter). 2 tablets after first loose stool then 1 tab after each additional loose stool. Maximum of 8 tablets in a 24 hours period.   Dry skin:  Recommend that you use moisturizer at least twice a day. Aveeno baby eczema, Eucerin, Lubriderm, are good choices. You can also purchase Sarna cream which helps with itching.

## 2012-01-02 NOTE — Patient Instructions (Addendum)
[facility] Discharge Instructions for Patients Receiving Chemotherapy  Today you received the following Zometa  To help prevent nausea and vomiting after your treatment, we encourage you to take your nausea medication as prescribed.   If you develop nausea and vomiting that is not controlled by your nausea medication, call the clinic. If it is after clinic hours your family physician or the after hours number for the clinic or go to the Emergency Department.   BELOW ARE SYMPTOMS THAT SHOULD BE REPORTED IMMEDIATELY:  *FEVER GREATER THAN 100.5 F  *CHILLS WITH OR WITHOUT FEVER  NAUSEA AND VOMITING THAT IS NOT CONTROLLED WITH YOUR NAUSEA MEDICATION  *UNUSUAL SHORTNESS OF BREATH  *UNUSUAL BRUISING OR BLEEDING  TENDERNESS IN MOUTH AND THROAT WITH OR WITHOUT PRESENCE OF ULCERS  *URINARY PROBLEMS  *BOWEL PROBLEMS  UNUSUAL RASH Items with * indicate a potential emergency and should be followed up as soon as possible.  One of the nurses will contact you 24 hours after your treatment. Please let the nurse know about any problems that you may have experienced. Feel free to call the clinic you have any questions or concerns. The clinic phone number is [phone].   I have been informed and understand all the instructions given to me. I know to contact the clinic, my physician, or go to the Emergency Department if any problems should occur. I do not have any questions at this time, but understand that I may call the clinic during office hours   should I have any questions or need assistance in obtaining follow up care.    __________________________________________  _____________  __________ Signature of Patient or Authorized Representative            Date                   Time    __________________________________________ Nurse's Signature    

## 2012-01-02 NOTE — Telephone Encounter (Signed)
appts made and printed for pt pt aware that zometa will be added to all lab dates

## 2012-01-04 LAB — PROTEIN ELECTROPHORESIS, SERUM
Albumin ELP: 52.2 % — ABNORMAL LOW (ref 55.8–66.1)
Alpha-1-Globulin: 9.2 % — ABNORMAL HIGH (ref 2.9–4.9)
Total Protein, Serum Electrophoresis: 6.5 g/dL (ref 6.0–8.3)

## 2012-01-04 LAB — KAPPA/LAMBDA LIGHT CHAINS
Kappa free light chain: 1.45 mg/dL (ref 0.33–1.94)
Kappa:Lambda Ratio: 0.91 (ref 0.26–1.65)
Lambda Free Lght Chn: 1.6 mg/dL (ref 0.57–2.63)

## 2012-01-09 ENCOUNTER — Other Ambulatory Visit: Payer: Self-pay | Admitting: Oncology

## 2012-01-18 ENCOUNTER — Other Ambulatory Visit: Payer: Self-pay | Admitting: Oncology

## 2012-01-18 DIAGNOSIS — C9000 Multiple myeloma not having achieved remission: Secondary | ICD-10-CM

## 2012-01-18 MED ORDER — OXYCODONE HCL 5 MG PO CAPS: 5.0000 mg | ORAL_CAPSULE | Freq: Four times a day (QID) | ORAL | Status: DC | PRN

## 2012-01-23 ENCOUNTER — Telehealth: Payer: Self-pay | Admitting: *Deleted

## 2012-01-23 NOTE — Telephone Encounter (Signed)
Pt's mother in law called to report pt face swollen so bad he can hardly open his eyes.  State has had issues w/ his face itching and peeling.  Now having swelling.  She called [facility] on Monday and spoke w/ a nurse named Zella Ball.  Was instructed for pt to hold his Revlimid on Mon and Tues but now swelling is worse.  Reports Marilynne Drivers has been trying to hold 2 meds at a time in effort to determine if pt is allergic to anything.  Instructed her to call [facility] again since they have been managing this issue and they need to know about the swelling.  Instructed her to call back to let us know what they said when she is able.  She verbalized understanding and will call [facility].

## 2012-01-30 ENCOUNTER — Other Ambulatory Visit: Payer: Self-pay

## 2012-01-30 ENCOUNTER — Telehealth: Payer: Self-pay | Admitting: *Deleted

## 2012-01-30 ENCOUNTER — Other Ambulatory Visit: Payer: Self-pay | Admitting: Oncology

## 2012-01-30 ENCOUNTER — Ambulatory Visit: Payer: Self-pay

## 2012-01-30 NOTE — Telephone Encounter (Signed)
Notified by [facility] pt missed his Zometa appt today.  POF to scheduling to r/s.

## 2012-01-31 ENCOUNTER — Telehealth: Payer: Self-pay | Admitting: *Deleted

## 2012-01-31 ENCOUNTER — Telehealth: Payer: Self-pay | Admitting: Oncology

## 2012-01-31 NOTE — Telephone Encounter (Signed)
Call from [place],  [place] pt missed his Zometa appt yesterday due to being at [facility].  He is home now, but still dealing w/ facial swelling and rash.  Apparently it has not been resovled yet but he is being cared for at [facility].   Informed Jamelle Haring of appt r/s to tomorrow and she says pt still feeling bad and would like to r/s to next week if possible.   POF sent to r/s his lab/zometa to next week.

## 2012-01-31 NOTE — Telephone Encounter (Signed)
called the interpreter line for [place]...she left pt vm regarding 12.6.13 appts...interpreter was agent [id number]

## 2012-01-31 NOTE — Telephone Encounter (Signed)
Per staff message and POF I have scheduled appt.  JMW  

## 2012-02-01 ENCOUNTER — Telehealth: Payer: Self-pay | Admitting: *Deleted

## 2012-02-01 ENCOUNTER — Ambulatory Visit: Payer: Self-pay

## 2012-02-01 ENCOUNTER — Other Ambulatory Visit: Payer: Self-pay | Admitting: Lab

## 2012-02-01 ENCOUNTER — Telehealth: Payer: Self-pay | Admitting: Oncology

## 2012-02-01 NOTE — Telephone Encounter (Signed)
Per staff message I  Have moved appt from today to Tuesday. JMW

## 2012-02-01 NOTE — Telephone Encounter (Signed)
Pt need to reschedule appt.Marland KitchenCalled intrepreter [phone] to tell pt about 12.10.13 appts. Done!!

## 2012-02-05 ENCOUNTER — Ambulatory Visit: Payer: Self-pay

## 2012-02-05 ENCOUNTER — Other Ambulatory Visit: Payer: Self-pay | Admitting: Lab

## 2012-02-06 ENCOUNTER — Other Ambulatory Visit: Payer: Self-pay | Admitting: Lab

## 2012-02-11 ENCOUNTER — Encounter (HOSPITAL_COMMUNITY): Payer: Self-pay | Admitting: Dentistry

## 2012-02-11 ENCOUNTER — Ambulatory Visit (HOSPITAL_COMMUNITY): Payer: Medicaid - Dental | Admitting: Dentistry

## 2012-02-11 VITALS — BP 141/83 | HR 95 | Temp 98.4°F

## 2012-02-11 DIAGNOSIS — K08109 Complete loss of teeth, unspecified cause, unspecified class: Secondary | ICD-10-CM

## 2012-02-11 DIAGNOSIS — K062 Gingival and edentulous alveolar ridge lesions associated with trauma: Secondary | ICD-10-CM

## 2012-02-11 DIAGNOSIS — K1370 Unspecified lesions of oral mucosa: Secondary | ICD-10-CM

## 2012-02-11 NOTE — Patient Instructions (Addendum)
Plan:  1. Return to clinic as scheduled for periodic oral examination and denture adjustments. 2. Patient to keep dentures out if sore spots arise. Patient to use salt water rinses as needed to aid healing.  3. Patient is to call for denture adjustment appointment as needed. 4. Patient to call if exposed bone is noted at any time. 5. I will plan on submitting a prior approval for the relines once the decision has been made by Medicaid on the retro-active prior approval for the initial insertion of the upper lower complete dentures.    Dr. Kristin Bruins

## 2012-02-11 NOTE — Progress Notes (Signed)
02/11/2012  Patient:            Shane Woodard Date of Birth:  Sep 19, 1960 MRN:                [id number]  BP 141/83  Pulse 95  Temp 98.4 F (36.9 C) (Oral)  Shane Woodard is a 51  year old male that presents for periodic oral exam and evaluation of dentures. Patient was recently admitted to [facility] for evaluation of facial skin disorder and discharged on 01/29/2012 . Patient with followup appointment for December 18 for possible diagnosis of this disorder.   Medical Hx: Past Medical History  Diagnosis Date  . Anemia   . Arthritis   . Multiple myeloma(203.0)   . S/P radiation therapy 05/03/11 - 05/04/11    Right Inferior/Anterior Rib Cage, Left Inferior/Anterior Rib Cage and Left Scapula/Shoulder  . Maintenance antineoplastic chemotherapy     [name], [name]  . S/P bone marrow transplant     [facility]  . ALLERGIES/ADVERSE DRUG REACTIONS: No Known Allergies MEDICATIONS: Current Outpatient Prescriptions  Medication Sig Dispense Refill  . acyclovir (ZOVIRAX) 800 MG tablet Take 800 mg by mouth 2 (two) times daily. Help prevent Viral infection      . aspirin 325 MG tablet Take 325 mg by mouth daily.      . cyclobenzaprine (FLEXERIL) 5 MG tablet TAKE 1 TABLET BY MOUTH THREE TIMES A DAY AS NEEDED FOR MUSCLE SPASMS  90 tablet  0  . docusate sodium (COLACE) 100 MG capsule Take 100 mg by mouth 3 (three) times daily as needed. Take as needed for constipation      . escitalopram (LEXAPRO) 10 MG tablet Take 1 tablet (10 mg total) by mouth daily.  30 tablet  2  . folic acid (FOLVITE) 1 MG tablet Take 1 mg by mouth daily.      Marland Kitchen lenalidomide (REVLIMID) 5 MG capsule Take 10 mg by mouth daily.      . ondansetron (ZOFRAN) 8 MG tablet TAKE 1 TABLET BY MOUTH EVERY 8 HOURS AS NEEDED TO PREVENT OR TREAT NAUSEA  30 tablet  5  . oxycodone (OXY-IR) 5 MG capsule Take 1 capsule (5 mg total) by mouth every 6 (six) hours as needed for pain. As needed for pain  90 capsule  0  .  prochlorperazine (COMPAZINE) 10 MG tablet Take 1 tablet (10 mg total) by mouth every 6 (six) hours as needed.  30 tablet  2    C/C: Patient is complaining of denture irritation. HPI:  Shane Woodard is a 51 year old male with multiple myeloma.  Patient is followed by Dr. Gaylyn Rong. Patient had all remaining teeth extracted on 03/28/2011. Upper and lower complete dentures subsequently fabricated and inserted on 06/13/2011. Patient now presents for periodic oral examination and evaluation of upper lower complete dentures.   DENTAL EXAM: General: Patient is a well-developed, well built male in no acute distress. Vitals: As above. Extraoral Exam: There is no lymphadenopathy.  There are no TMJ Symptoms. Patient has evidence of a facial dermatitis that is currently undiagnosed by patient report. Intraoral  Exam: Patient has Normal Saliva. There are no Soft tissue lesions. No obvious denture irritation is noted. Dentition: Patient is edentulous As before. Prosthodontic: Upper lower complete dentures are stable and retentive. Patient may benefit from upper lower complete denture relines. Pressure indicating paste was applied to the dentures. Dentures adjustments were made as needed. Dentures were polished. Occlusion: The occlusion is stable. Occlusion was evaluated and  adjusted as needed.  Plan:  1. Return to clinic as scheduled for periodic oral examination and denture adjustments. 2. Patient to keep dentures out if sore spots arise. Patient to use salt water rinses as needed to aid healing.  3. Patient is to call for denture adjustment appointment as needed. 4. Patient to call if exposed bone is noted at any time. 5. I will plan on submitting a prior approval for the relines once the decision has been made by Medicaid on the retro-active prior approval for the initial insertion of the upper lower complete dentures.   Charlynne Pander, DDS

## 2012-02-12 ENCOUNTER — Other Ambulatory Visit: Payer: Self-pay | Admitting: Oncology

## 2012-02-21 ENCOUNTER — Other Ambulatory Visit: Payer: Self-pay | Admitting: *Deleted

## 2012-02-21 ENCOUNTER — Other Ambulatory Visit: Payer: Self-pay | Admitting: Oncology

## 2012-02-21 DIAGNOSIS — C9000 Multiple myeloma not having achieved remission: Secondary | ICD-10-CM

## 2012-02-21 DIAGNOSIS — M898X9 Other specified disorders of bone, unspecified site: Secondary | ICD-10-CM

## 2012-02-21 MED ORDER — OXYCODONE HCL 5 MG PO CAPS: 5.0000 mg | ORAL_CAPSULE | Freq: Four times a day (QID) | ORAL | Status: DC | PRN

## 2012-02-21 NOTE — Telephone Encounter (Signed)
NOTIFIED DR.HA'S NURSE, NATALIE STROUD,RN. SHE WILL OBTAIN THE PRESCRIPTION FROM DR.HA. NOTIFIED PT. HE VOICES UNDERSTANDING.

## 2012-02-28 ENCOUNTER — Other Ambulatory Visit (HOSPITAL_BASED_OUTPATIENT_CLINIC_OR_DEPARTMENT_OTHER): Payer: Medicaid Other | Admitting: Lab

## 2012-02-28 ENCOUNTER — Ambulatory Visit (HOSPITAL_BASED_OUTPATIENT_CLINIC_OR_DEPARTMENT_OTHER): Payer: Medicaid Other

## 2012-02-28 ENCOUNTER — Encounter: Payer: Self-pay | Admitting: Oncology

## 2012-02-28 VITALS — BP 123/78 | HR 108 | Temp 97.7°F | Resp 20

## 2012-02-28 DIAGNOSIS — C9000 Multiple myeloma not having achieved remission: Secondary | ICD-10-CM

## 2012-02-28 LAB — BASIC METABOLIC PANEL (CC13)
BUN: 16 mg/dL (ref 7.0–26.0)
Calcium: 9.4 mg/dL (ref 8.4–10.4)
Creatinine: 0.9 mg/dL (ref 0.7–1.3)
Glucose: 92 mg/dl (ref 70–99)
Potassium: 4.1 mEq/L (ref 3.5–5.1)

## 2012-02-28 MED ORDER — SODIUM CHLORIDE 0.9 % IV SOLN
Freq: Once | INTRAVENOUS | Status: AC
  Administered 2012-02-28: 10:00:00 via INTRAVENOUS

## 2012-02-28 MED ORDER — ZOLEDRONIC ACID 4 MG/100ML IV SOLN
4.0000 mg | Freq: Once | INTRAVENOUS | Status: AC
  Administered 2012-02-28: 4 mg via INTRAVENOUS
  Filled 2012-02-28: qty 100

## 2012-02-28 NOTE — Progress Notes (Signed)
Patient has medicare also and is waiting for the card. I advised him to bring on his next visit.

## 2012-03-24 ENCOUNTER — Other Ambulatory Visit: Payer: Self-pay | Admitting: Oncology

## 2012-03-26 ENCOUNTER — Other Ambulatory Visit: Payer: Self-pay | Admitting: *Deleted

## 2012-03-26 DIAGNOSIS — C9000 Multiple myeloma not having achieved remission: Secondary | ICD-10-CM

## 2012-03-26 NOTE — Progress Notes (Signed)
Orders received from [facility],  Lorrin Mais, RN.  LO#[phone]. [phone].  CBC w/ diff and platelet on 04/21/12 and 05/19/12.  Orders placed and POF sent.

## 2012-03-27 ENCOUNTER — Telehealth: Payer: Self-pay | Admitting: Oncology

## 2012-03-27 NOTE — Telephone Encounter (Signed)
S/w luwanna and she is aware of the lab appts for feb and march that he will need an interpreter for both lab appts

## 2012-04-02 ENCOUNTER — Ambulatory Visit (HOSPITAL_BASED_OUTPATIENT_CLINIC_OR_DEPARTMENT_OTHER): Payer: Medicaid Other

## 2012-04-02 ENCOUNTER — Other Ambulatory Visit (HOSPITAL_BASED_OUTPATIENT_CLINIC_OR_DEPARTMENT_OTHER): Payer: Medicaid Other | Admitting: Lab

## 2012-04-02 VITALS — BP 116/76 | HR 86 | Temp 97.7°F | Resp 16

## 2012-04-02 DIAGNOSIS — C9000 Multiple myeloma not having achieved remission: Secondary | ICD-10-CM

## 2012-04-02 LAB — BASIC METABOLIC PANEL (CC13)
Calcium: 9 mg/dL (ref 8.4–10.4)
Glucose: 118 mg/dl — ABNORMAL HIGH (ref 70–99)
Sodium: 139 mEq/L (ref 136–145)

## 2012-04-02 MED ORDER — SODIUM CHLORIDE 0.9 % IV SOLN
Freq: Once | INTRAVENOUS | Status: AC
  Administered 2012-04-02: 10:00:00 via INTRAVENOUS

## 2012-04-02 MED ORDER — ZOLEDRONIC ACID 4 MG/100ML IV SOLN
4.0000 mg | Freq: Once | INTRAVENOUS | Status: AC
  Administered 2012-04-02: 4 mg via INTRAVENOUS
  Filled 2012-04-02: qty 100

## 2012-04-02 NOTE — Patient Instructions (Addendum)
[facility] Discharge Instructions for Patients Receiving Chemotherapy  Today you received the following chemotherapy agents Zometa  If you develop nausea and vomiting that is not controlled by your nausea medication, call the clinic. If it is after clinic hours your family physician or the after hours number for the clinic or go to the Emergency Department.   BELOW ARE SYMPTOMS THAT SHOULD BE REPORTED IMMEDIATELY:  *FEVER GREATER THAN 100.5 F  *CHILLS WITH OR WITHOUT FEVER  NAUSEA AND VOMITING THAT IS NOT CONTROLLED WITH YOUR NAUSEA MEDICATION  *UNUSUAL SHORTNESS OF BREATH  *UNUSUAL BRUISING OR BLEEDING  TENDERNESS IN MOUTH AND THROAT WITH OR WITHOUT PRESENCE OF ULCERS  *URINARY PROBLEMS  *BOWEL PROBLEMS  UNUSUAL RASH Items with * indicate a potential emergency and should be followed up as soon as possible.   Feel free to call the clinic you have any questions or concerns. The clinic phone number is [phone].   I have been informed and understand all the instructions given to me. I know to contact the clinic, my physician, or go to the Emergency Department if any problems should occur. I do not have any questions at this time, but understand that I may call the clinic during office hours   should I have any questions or need assistance in obtaining follow up care.    __________________________________________  _____________  __________ Signature of Patient or Authorized Representative            Date                   Time    __________________________________________ Nurse's Signature

## 2012-04-07 ENCOUNTER — Ambulatory Visit (HOSPITAL_COMMUNITY): Payer: Medicaid - Dental | Admitting: Dentistry

## 2012-04-07 ENCOUNTER — Encounter (HOSPITAL_COMMUNITY): Payer: Self-pay | Admitting: Dentistry

## 2012-04-07 VITALS — BP 124/77 | HR 77 | Temp 98.5°F

## 2012-04-07 DIAGNOSIS — K062 Gingival and edentulous alveolar ridge lesions associated with trauma: Secondary | ICD-10-CM

## 2012-04-07 DIAGNOSIS — Z463 Encounter for fitting and adjustment of dental prosthetic device: Secondary | ICD-10-CM

## 2012-04-07 DIAGNOSIS — K1370 Unspecified lesions of oral mucosa: Secondary | ICD-10-CM

## 2012-04-07 DIAGNOSIS — K08109 Complete loss of teeth, unspecified cause, unspecified class: Secondary | ICD-10-CM

## 2012-04-07 DIAGNOSIS — Z972 Presence of dental prosthetic device (complete) (partial): Secondary | ICD-10-CM

## 2012-04-07 NOTE — Patient Instructions (Signed)
Return to clinic tomorrow for insertion of denture repair. Use salt water rinses every 2 hours to aid healing of soft tissues. Dr. Kristin Bruins

## 2012-04-07 NOTE — Progress Notes (Signed)
04/07/2012  Patient:            Shane Woodard Date of Birth:  [birth year] MRN:                [id number]  BP 124/77  Pulse 77  Temp(Src) 98.5 F (36.9 C) (Oral)  Shane Woodard is a 52  year old male that presents for dental periodic oral exam and evaluation of dentures. Patient is complaining of denture irritation to lower buccal extensions of the mandibular left and mandibular right denture. Patient also is complaining of chipped denture tooth #9.    Medical Hx: Past Medical History  Diagnosis Date  . Anemia   . Arthritis   . Multiple myeloma(203.0)   . S/P radiation therapy 05/03/11 - 05/04/11    Right Inferior/Anterior Rib Cage, Left Inferior/Anterior Rib Cage and Left Scapula/Shoulder  . Maintenance antineoplastic chemotherapy     [name], [name]  . S/P bone marrow transplant     [facility]  . ALLERGIES/ADVERSE DRUG REACTIONS: No Known Allergies MEDICATIONS: Current Outpatient Prescriptions  Medication Sig Dispense Refill  . acyclovir (ZOVIRAX) 800 MG tablet Take 800 mg by mouth 2 (two) times daily. Help prevent Viral infection      . aspirin 325 MG tablet Take 325 mg by mouth daily.      . cyclobenzaprine (FLEXERIL) 5 MG tablet TAKE 1 TABLET BY MOUTH THREE TIMES A DAY AS NEEDED FOR MUSCLE SPASMS  90 tablet  0  . docusate sodium (COLACE) 100 MG capsule Take 100 mg by mouth 3 (three) times daily as needed. Take as needed for constipation      . escitalopram (LEXAPRO) 10 MG tablet Take 1 tablet (10 mg total) by mouth daily.  30 tablet  2  . folic acid (FOLVITE) 1 MG tablet Take 1 mg by mouth daily.      Marland Kitchen lenalidomide (REVLIMID) 5 MG capsule Take 10 mg by mouth daily.      . ondansetron (ZOFRAN) 8 MG tablet TAKE 1 TABLET BY MOUTH EVERY 8 HOURS AS NEEDED TO PREVENT OR TREAT NAUSEA  30 tablet  5  . oxycodone (OXY-IR) 5 MG capsule Take 1 capsule (5 mg total) by mouth every 6 (six) hours as needed for pain. As needed for pain  90 capsule  0  . prochlorperazine  (COMPAZINE) 10 MG tablet Take 1 tablet (10 mg total) by mouth every 6 (six) hours as needed.  30 tablet  2   No current facility-administered medications for this visit.    C/C: Patient is complaining of denture irritation and chipped denture tooth #9. HPI:  Shane Woodard is a 52 year old male with multiple myeloma.  Patient is followed by Dr. Gaylyn Rong. Patient had all remaining teeth extracted on 03/28/2011. Upper and lower complete dentures subsequently fabricated and inserted on 06/13/2011. Patient now presents for evaluation of upper lower complete dentures.   DENTAL EXAM: General: Patient is a well-developed, well built male in no acute distress. Vitals: As above. Extraoral Exam: There is no lymphadenopathy.  There are no TMJ Symptoms.  Intraoral  Exam: Patient has Normal Saliva. There is slight erythema to the mandibular left and mandibular right buccal extensions. Dentition: Patient is edentulous as before. Prosthodontic: Upper and lower complete dentures are stable and retentive. Patient may benefit from upper and lower complete denture relines. Prior approval has been submitted via [place] tracks and results up are approval are pending.  Tooth #9 is chipped and will need to be replaced. Pressure  indicating paste was applied to the dentures. Dentures adjustments were made as needed. Dentures were polished.  Patient accepts results of denture adjustment. Occlusion: The occlusion is stable. Occlusion was evaluated. Patient with tendency to protrude to end to end position.  Dentures were disinfected  And then sent to the dental lab for replacement of tooth #9 in the upper complete denture.   Plan:  1. Return to clinic as scheduled for insertion of denture repair tomorrow.  Will reevaluate occlusion at that time. 2. Patient to keep dentures out if sore spots arise. Patient to use salt water rinses as needed to aid healing.  3. Patient is to call for denture adjustment appointment as  needed. 4. Patient to call if exposed bone is noted at any time. 5. Patient will be scheduled for denture relines once prior approval is obtained from Medicaid. P     lease note: Retroactive prior approval for initial upper and lower complete dentures was provided by Medicaid and was submitted for payment.   Charlynne Pander, DDS

## 2012-04-08 ENCOUNTER — Ambulatory Visit (HOSPITAL_COMMUNITY): Payer: Medicaid - Dental | Admitting: Dentistry

## 2012-04-08 ENCOUNTER — Encounter (HOSPITAL_COMMUNITY): Payer: Self-pay | Admitting: Dentistry

## 2012-04-08 VITALS — BP 122/73 | HR 86 | Temp 99.1°F

## 2012-04-08 DIAGNOSIS — Z463 Encounter for fitting and adjustment of dental prosthetic device: Secondary | ICD-10-CM

## 2012-04-08 DIAGNOSIS — K08109 Complete loss of teeth, unspecified cause, unspecified class: Secondary | ICD-10-CM

## 2012-04-08 DIAGNOSIS — Z972 Presence of dental prosthetic device (complete) (partial): Secondary | ICD-10-CM

## 2012-04-08 NOTE — Progress Notes (Signed)
04/08/2012  Patient:            Shane Woodard Date of Birth:  [birth year] MRN:                [id number]  BP 122/73  Pulse 86  Temp(Src) 99.1 F (37.3 C) (Oral)  Shane Woodard presents for insertion of upper denture repair. Procedure: Pressure indicating paste applied to dentures. Adjustments made as needed. [place]. Occlusion evaluated and adjustments made as needed for Centric Relation and protrusive strokes. Good esthetics, phonetics, fit, and function noted. Patient accepts results. Patient to keep dentures out if sore spots develop. Use salt water rinses as needed to aid healing. Return to clinic as scheduled for denture adjustment.  Call if problems arise before then. Patient dismissed in stable condition. Charlynne Pander

## 2012-04-08 NOTE — Patient Instructions (Signed)
Patient to keep dentures out if sore spots develop. Use salt water rinses as needed to aid healing. Return to clinic as scheduled for denture adjustment.  Call if problems arise before then.  

## 2012-04-21 ENCOUNTER — Other Ambulatory Visit: Payer: Self-pay | Admitting: Lab

## 2012-04-24 ENCOUNTER — Other Ambulatory Visit: Payer: Self-pay | Admitting: Oncology

## 2012-04-28 ENCOUNTER — Other Ambulatory Visit: Payer: Self-pay | Admitting: Oncology

## 2012-04-29 ENCOUNTER — Other Ambulatory Visit: Payer: Self-pay | Admitting: Oncology

## 2012-04-30 ENCOUNTER — Other Ambulatory Visit: Payer: Self-pay | Admitting: *Deleted

## 2012-04-30 ENCOUNTER — Ambulatory Visit (HOSPITAL_BASED_OUTPATIENT_CLINIC_OR_DEPARTMENT_OTHER): Payer: Medicaid Other

## 2012-04-30 ENCOUNTER — Other Ambulatory Visit (HOSPITAL_BASED_OUTPATIENT_CLINIC_OR_DEPARTMENT_OTHER): Payer: Medicaid Other

## 2012-04-30 VITALS — BP 118/77 | HR 88 | Temp 99.7°F | Resp 16

## 2012-04-30 DIAGNOSIS — C9000 Multiple myeloma not having achieved remission: Secondary | ICD-10-CM

## 2012-04-30 LAB — BASIC METABOLIC PANEL (CC13)
BUN: 7.6 mg/dL (ref 7.0–26.0)
CO2: 21 mEq/L — ABNORMAL LOW (ref 22–29)
Chloride: 109 mEq/L — ABNORMAL HIGH (ref 98–107)
Creatinine: 0.8 mg/dL (ref 0.7–1.3)

## 2012-04-30 LAB — CBC WITH DIFFERENTIAL/PLATELET
Basophils Absolute: 0.1 10*3/uL (ref 0.0–0.1)
EOS%: 57.3 % — ABNORMAL HIGH (ref 0.0–7.0)
HGB: 13.5 g/dL (ref 13.0–17.1)
MCH: 30.3 pg (ref 27.2–33.4)
MCV: 91.7 fL (ref 79.3–98.0)
MONO%: 4 % (ref 0.0–14.0)
RBC: 4.46 10*6/uL (ref 4.20–5.82)
RDW: 16.9 % — ABNORMAL HIGH (ref 11.0–14.6)

## 2012-04-30 MED ORDER — ZOLEDRONIC ACID 4 MG/100ML IV SOLN
4.0000 mg | Freq: Once | INTRAVENOUS | Status: AC
  Administered 2012-04-30: 4 mg via INTRAVENOUS
  Filled 2012-04-30: qty 100

## 2012-04-30 MED ORDER — SODIUM CHLORIDE 0.9 % IV SOLN
Freq: Once | INTRAVENOUS | Status: AC
  Administered 2012-04-30: 10:00:00 via INTRAVENOUS

## 2012-04-30 MED ORDER — OXYCODONE HCL 5 MG PO CAPS: 5.0000 mg | ORAL_CAPSULE | Freq: Two times a day (BID) | ORAL | Status: DC | PRN

## 2012-04-30 NOTE — Telephone Encounter (Signed)
Pt in infusion room,  Requested refill on oxycodone.  He brought empty bottle of oxycodone 5 mg, take 1 to 2 q 4hrs prn #120 filled on 03/24/12 by Provider at [facility].  Pt states out of medication.  Reports ongoing bilat rib pain, taking 2 oxy ir 6 times a day.  Wears abdominal binder daily to help rib pain.  He is actually seeing Dr. Greggory Stallion at [facility] later this afternoon.   Dr. Gaylyn Rong gave Rx for oxy ir to take one q 12 hrs prn w/ instructions to pt that he should not need to take as much pain medication now as he was a few months ago.  Instructed pt if pain not managed on one tab q 12 hrs, Dr. Gaylyn Rong may refer pt to Pain Clinic.   Also instructed pt to inform Dr. Greggory Stallion of refill from Dr. Gaylyn Rong today and discuss pain management when he sees Dr. Greggory Stallion.  Pt verbalized understanding.  Pt also reports bilat eyes slightly swollen w/ reddened eye lids. He has seen eye doctor and will inform Dr. Greggory Stallion of this today.

## 2012-04-30 NOTE — Patient Instructions (Addendum)
Zoledronic Acid injection (Hypercalcemia, Oncology) What is this medicine? ZOLEDRONIC ACID (ZOE le dron ik AS id) lowers the amount of calcium loss from bone. It is used to treat too much calcium in your blood from cancer. It is also used to prevent complications of cancer that has spread to the bone. This medicine may be used for other purposes; ask your health care provider or pharmacist if you have questions. What should I tell my health care provider before I take this medicine? They need to know if you have any of these conditions: -aspirin-sensitive asthma -dental disease -kidney disease -an unusual or allergic reaction to zoledronic acid, other medicines, foods, dyes, or preservatives -pregnant or trying to get pregnant -breast-feeding How should I use this medicine? This medicine is for infusion into a vein. It is given by a health care professional in a hospital or clinic setting. Talk to your pediatrician regarding the use of this medicine in children. Special care may be needed. Overdosage: If you think you have taken too much of this medicine contact a poison control center or emergency room at once. NOTE: This medicine is only for you. Do not share this medicine with others. What if I miss a dose? It is important not to miss your dose. Call your doctor or health care professional if you are unable to keep an appointment. What may interact with this medicine? -certain antibiotics given by injection -NSAIDs, medicines for pain and inflammation, like ibuprofen or naproxen -some diuretics like bumetanide, furosemide -teriparatide -thalidomide This list may not describe all possible interactions. Give your health care provider a list of all the medicines, herbs, non-prescription drugs, or dietary supplements you use. Also tell them if you smoke, drink alcohol, or use illegal drugs. Some items may interact with your medicine. What should I watch for while using this medicine? Visit  your doctor or health care professional for regular checkups. It may be some time before you see the benefit from this medicine. Do not stop taking your medicine unless your doctor tells you to. Your doctor may order blood tests or other tests to see how you are doing. Women should inform their doctor if they wish to become pregnant or think they might be pregnant. There is a potential for serious side effects to an unborn child. Talk to your health care professional or pharmacist for more information. You should make sure that you get enough calcium and vitamin D while you are taking this medicine. Discuss the foods you eat and the vitamins you take with your health care professional. Some people who take this medicine have severe bone, joint, and/or muscle pain. This medicine may also increase your risk for a broken thigh bone. Tell your doctor right away if you have pain in your upper leg or groin. Tell your doctor if you have any pain that does not go away or that gets worse. What side effects may I notice from receiving this medicine? Side effects that you should report to your doctor or health care professional as soon as possible: -allergic reactions like skin rash, itching or hives, swelling of the face, lips, or tongue -anxiety, confusion, or depression -breathing problems -changes in vision -feeling faint or lightheaded, falls -jaw burning, cramping, pain -muscle cramps, stiffness, or weakness -trouble passing urine or change in the amount of urine Side effects that usually do not require medical attention (report to your doctor or health care professional if they continue or are bothersome): -bone, joint, or muscle pain -  fever -hair loss -irritation at site where injected -loss of appetite -nausea, vomiting -stomach upset -tired This list may not describe all possible side effects. Call your doctor for medical advice about side effects. You may report side effects to FDA at  1-800-FDA-1088. Where should I keep my medicine? This drug is given in a hospital or clinic and will not be stored at home. NOTE: This sheet is a summary. It may not cover all possible information. If you have questions about this medicine, talk to your doctor, pharmacist, or health care provider.  2013, [employer]. (08/11/2010 9:06:58 AM)  

## 2012-05-01 ENCOUNTER — Encounter (HOSPITAL_COMMUNITY): Payer: Self-pay | Admitting: Dentistry

## 2012-05-07 NOTE — Progress Notes (Signed)
Received office notes from Chancy Hurter, FNP (Dr. Marlaine Hind) @ [facility].  Forwarded to Dr. Gaylyn Rong.

## 2012-05-12 ENCOUNTER — Encounter (HOSPITAL_COMMUNITY): Payer: Self-pay | Admitting: Dentistry

## 2012-05-12 ENCOUNTER — Ambulatory Visit (HOSPITAL_COMMUNITY): Payer: Medicaid - Dental | Admitting: Dentistry

## 2012-05-12 VITALS — BP 130/80 | HR 90 | Temp 98.5°F

## 2012-05-12 DIAGNOSIS — K08109 Complete loss of teeth, unspecified cause, unspecified class: Secondary | ICD-10-CM

## 2012-05-12 NOTE — Progress Notes (Signed)
05/12/2012  Patient:            Shane Woodard Date of Birth:  March 27, 1960 MRN:                [id number]  BP 130/80  Pulse 90  Temp(Src) 98.5 F (36.9 C) (Oral)  [name] presents for upper and lower complete denture realign impressions. Procedure:  Upper and lower border molding and final impressions in iso-functional compound. Patient tolerated procedure well. To [facility] for processing of lab relines in 50:50 [place] 199. Return to clinic for upper and lower complete denture reline insertion.  Charlynne Pander, DDS

## 2012-05-12 NOTE — Patient Instructions (Signed)
Return to clinic as scheduled for insertion of upper lower complete denture relines. Dr. Kulinski 

## 2012-05-13 ENCOUNTER — Encounter (HOSPITAL_COMMUNITY): Payer: Self-pay | Admitting: Dentistry

## 2012-05-13 ENCOUNTER — Ambulatory Visit (HOSPITAL_COMMUNITY): Payer: Medicaid - Dental | Admitting: Dentistry

## 2012-05-13 VITALS — BP 129/77 | HR 81 | Temp 99.1°F

## 2012-05-13 DIAGNOSIS — C9000 Multiple myeloma not having achieved remission: Secondary | ICD-10-CM

## 2012-05-13 DIAGNOSIS — Z463 Encounter for fitting and adjustment of dental prosthetic device: Secondary | ICD-10-CM

## 2012-05-13 NOTE — Patient Instructions (Addendum)
Patient to keep dentures out if sore spots develop. Use salt water rinses as needed to aid healing. Return to clinic as scheduled for denture adjustment.  Call if problems arise before then. Patient dismissed in stable condition. Dr. Kristin Bruins Instructions for Denture Use and Care  Congratulations, you are on the way to oral rehabilitation!  You have just received a new set of complete or partial dentures.  These prostheses will help to improve both your appearance and chewing ability.  These instructions will help you get adjusted to your dentures as well as care for them properly.  Please read these instructions carefully and completely as soon as you get home.  If you or your caregiver have any questions please notify the [facility] at [phone].  HOW YOUR DENTURES LOOK AND FEEL Soon after you begin wearing your dentures, you may feel that your dentures are too large or even loose.  As our mouth and facial muscles become accustomed to the dentures, these feelings will go away.  You also may feel that you are salivating more than you normally do.  This feeling should go away as you get used to having the dentures in your mouth.  You may bite your cheek or your tongue; this will eventually resolve itself as you wear your dentures.  Some soreness is to be expected, but you should not hurt.  If your mouth hurts, call your dentist.  A denture adhesive may occasionally be necessary to hold your dentures in place more securely.  The dentist will let you know when one is recommended for you.  SPEAKING Wearing dentures will change the sound of your voice initially.  This will be noticed by you more than anyone else.  Bite and swallow before you speak, in order to place your dentures in position so that you may speak more clearly.  Practice speaking by reading aloud or counting from 1 to 100 very slowly and distinctly.  After some practice your mouth will become accustomed to your dentures  and you will speak more clearly.  EATING Chewing will definitely be different after you receive your dentures.  With a little practice and patience you should be able to eat just about any kind of food.  Begin by eating small quantities of food that are cut into small pieces.  Star with soft foods such as eggs, cooked vegetables, or puddings.  As you gain confidence advance  Your diet to whatever texture foods you can tolerate.  DENTURE CARE Dentures can collect plaque and calculus much the same as natural teeth can.  If not removed on a regular basis, your dentures will not look or feel clean, and you will experience denture odor.  It is very important that you remove your dentures at bedtime and clean them thoroughly.  You should: 1. Clean your dentures over a sink full of water so if dropped, breakage will be prevented. 2. Rinse your dentures with cool water to remove any large food particles. 3. Use soap and water or a denture cleanser or paste to clean the dentures.  Do not use regular toothpaste as it may abrade the denture base or teeth. 4. Use a moistened denture brush to clean all surfaces (inside and outside). 5. Rinse thoroughly to remove any remaining soap or denture cleanser. 6. Use a soft bristle toothbrush to gently brush any natural teeth, gums, tongue, and palate at bedtime and before reinserting your dentures. 7. Do not sleep with your dentures in your  mouth at night.  Remove your dentures and soak them overnight in a denture cup filled with water or denture solution as recommended by your dentist.  This routine will become second nature and will increase the life and comfort of your dentures.  Please do not try to adjust these dentures yourself; you could damage them.  FOLLOW-UP You should call or make an appointment with your dentist.  Your dentist would like to see you at least once a year for a check-up and examination.

## 2012-05-13 NOTE — Progress Notes (Signed)
05/13/2012  Patient:            Shane Woodard Date of Birth:  Nov 09, 1960 MRN:                [id number]  BP 129/77  Pulse 81  Temp(Src) 99.1 F (37.3 C) (Oral)  [name] presents for insertion of upper and lower complete denture relines.  Procedure: Pressure indicating paste applied to dentures. Adjustments made as needed. [place]. Occlusion evaluated and adjustments made as needed for Centric Relation and protrusive strokes. Good esthetics, phonetics, fit, and function noted. Patient accepts results. Post op instructions provided in written and verbal formats on use and care of dentures. Patient to keep dentures out if sore spots develop. Use salt water rinses as needed to aid healing. Return to clinic as scheduled for denture adjustment.  Call if problems arise before then. Patient dismissed in stable condition.   Charlynne Pander, DDS

## 2012-05-19 ENCOUNTER — Ambulatory Visit (HOSPITAL_COMMUNITY): Payer: Medicaid - Dental | Admitting: Dentistry

## 2012-05-19 ENCOUNTER — Encounter (HOSPITAL_COMMUNITY): Payer: Self-pay | Admitting: Dentistry

## 2012-05-19 ENCOUNTER — Other Ambulatory Visit (HOSPITAL_BASED_OUTPATIENT_CLINIC_OR_DEPARTMENT_OTHER): Payer: Medicaid Other | Admitting: Lab

## 2012-05-19 VITALS — BP 125/69 | HR 79 | Temp 99.5°F

## 2012-05-19 DIAGNOSIS — C9000 Multiple myeloma not having achieved remission: Secondary | ICD-10-CM

## 2012-05-19 DIAGNOSIS — K062 Gingival and edentulous alveolar ridge lesions associated with trauma: Secondary | ICD-10-CM

## 2012-05-19 DIAGNOSIS — Z463 Encounter for fitting and adjustment of dental prosthetic device: Secondary | ICD-10-CM

## 2012-05-19 DIAGNOSIS — Z972 Presence of dental prosthetic device (complete) (partial): Secondary | ICD-10-CM

## 2012-05-19 DIAGNOSIS — K08109 Complete loss of teeth, unspecified cause, unspecified class: Secondary | ICD-10-CM

## 2012-05-19 LAB — CBC WITH DIFFERENTIAL/PLATELET
BASO%: 0.1 % (ref 0.0–2.0)
EOS%: 61.7 % — ABNORMAL HIGH (ref 0.0–7.0)
LYMPH%: 22.7 % (ref 14.0–49.0)
MCHC: 32.7 g/dL (ref 32.0–36.0)
MCV: 93.7 fL (ref 79.3–98.0)
MONO#: 0.6 10*3/uL (ref 0.1–0.9)
MONO%: 4.1 % (ref 0.0–14.0)
Platelets: 107 10*3/uL — ABNORMAL LOW (ref 140–400)
RBC: 4.29 10*6/uL (ref 4.20–5.82)
WBC: 14.9 10*3/uL — ABNORMAL HIGH (ref 4.0–10.3)

## 2012-05-19 NOTE — Progress Notes (Signed)
05/19/2012  Patient:            Shane Woodard Date of Birth:  June 14, 1960 MRN:                [id number]  BP 125/69  Pulse 79  Temp(Src) 99.5 F (37.5 C) (Oral)   Shane Woodard presents for adjustement of recently inserted upper and lower complete denture relines. SUBJECTIVE: Patient is complaining of denture irritation to the lower right buccal extension. OBJECTIVE: No sign of denture ulcerations. Slight erythema to the right buccal extension area numbers 30-32. Procedure: Pressure indicating paste applied to dentures. Adjustments made as needed. [place]. Occlusion evaluated and adjustments made as needed for Centric Relation and protrusive strokes. Patient accepts results. Use salt water rinses as needed to aid healing. Patient to keep dentures out if sore spots develop. Return to clinic as scheduled for denture adjustment.  Call if problems arise before then. Patient dismissed in stable condition.   Charlynne Pander, DDS

## 2012-05-19 NOTE — Patient Instructions (Addendum)
Use salt water rinses as needed to aid healing. Patient to keep dentures out if sore spots develop. Return to clinic as scheduled for denture adjustment.   Call if problems arise before then.  Dr. Nava Song 

## 2012-05-23 ENCOUNTER — Other Ambulatory Visit: Payer: Self-pay | Admitting: Oncology

## 2012-05-29 ENCOUNTER — Other Ambulatory Visit: Payer: Self-pay | Admitting: Oncology

## 2012-05-29 DIAGNOSIS — C9000 Multiple myeloma not having achieved remission: Secondary | ICD-10-CM

## 2012-05-29 DIAGNOSIS — M898X9 Other specified disorders of bone, unspecified site: Secondary | ICD-10-CM

## 2012-05-29 MED ORDER — OXYCODONE HCL 5 MG PO CAPS: 5.0000 mg | ORAL_CAPSULE | Freq: Two times a day (BID) | ORAL | Status: DC | PRN

## 2012-06-04 ENCOUNTER — Ambulatory Visit (HOSPITAL_BASED_OUTPATIENT_CLINIC_OR_DEPARTMENT_OTHER): Payer: Medicaid Other

## 2012-06-04 ENCOUNTER — Other Ambulatory Visit (HOSPITAL_BASED_OUTPATIENT_CLINIC_OR_DEPARTMENT_OTHER): Payer: Medicaid Other | Admitting: Lab

## 2012-06-04 VITALS — BP 125/84 | HR 85 | Temp 98.0°F | Resp 19

## 2012-06-04 DIAGNOSIS — C9000 Multiple myeloma not having achieved remission: Secondary | ICD-10-CM

## 2012-06-04 LAB — BASIC METABOLIC PANEL (CC13)
BUN: 6 mg/dL — ABNORMAL LOW (ref 7.0–26.0)
Chloride: 107 mEq/L (ref 98–107)
Potassium: 3.6 mEq/L (ref 3.5–5.1)
Sodium: 141 mEq/L (ref 136–145)

## 2012-06-04 MED ORDER — ZOLEDRONIC ACID 4 MG/100ML IV SOLN
4.0000 mg | Freq: Once | INTRAVENOUS | Status: AC
  Administered 2012-06-04: 4 mg via INTRAVENOUS
  Filled 2012-06-04: qty 100

## 2012-06-04 MED ORDER — SODIUM CHLORIDE 0.9 % IV SOLN
Freq: Once | INTRAVENOUS | Status: AC
  Administered 2012-06-04: 10:00:00 via INTRAVENOUS

## 2012-06-13 ENCOUNTER — Other Ambulatory Visit: Payer: Self-pay | Admitting: *Deleted

## 2012-06-13 DIAGNOSIS — C9000 Multiple myeloma not having achieved remission: Secondary | ICD-10-CM

## 2012-06-16 NOTE — Progress Notes (Signed)
Received orders from [facility], Almon Register, RN,  For CBC on 07/07/12 and 08/04/12.   Called Zella Ball and notified pt has appt here on 5/07 for lab and office visit.  Do they still need lab on 5/12?   She instructs pt can have labs on 5/07 as scheduled and then one month later on 07/30/12.   Fax the results to [facility] at fax [phone].

## 2012-06-17 ENCOUNTER — Telehealth: Payer: Self-pay | Admitting: Anesthesiology

## 2012-06-18 ENCOUNTER — Encounter (HOSPITAL_COMMUNITY): Payer: Self-pay | Admitting: Dentistry

## 2012-06-24 ENCOUNTER — Ambulatory Visit (HOSPITAL_COMMUNITY): Payer: Medicaid - Dental | Admitting: Dentistry

## 2012-06-24 ENCOUNTER — Encounter (HOSPITAL_COMMUNITY): Payer: Self-pay | Admitting: Dentistry

## 2012-06-24 VITALS — BP 124/82 | HR 83 | Temp 99.1°F

## 2012-06-24 DIAGNOSIS — Z463 Encounter for fitting and adjustment of dental prosthetic device: Secondary | ICD-10-CM

## 2012-06-24 DIAGNOSIS — K062 Gingival and edentulous alveolar ridge lesions associated with trauma: Secondary | ICD-10-CM

## 2012-06-24 DIAGNOSIS — K08109 Complete loss of teeth, unspecified cause, unspecified class: Secondary | ICD-10-CM

## 2012-06-24 NOTE — Patient Instructions (Signed)
Use salt water rinses as needed to aid healing. Patient to keep dentures out if sore spots develop. Return to clinic as scheduled for denture adjustment.   Call if problems arise before then.  Dr. Kristin Bruins

## 2012-06-24 NOTE — Progress Notes (Signed)
06/24/2012  Patient:            Shane Woodard Date of Birth:  11/02/1960 MRN:                [id number]  BP 124/82  Pulse 83  Temp(Src) 99.1 F (37.3 C) (Oral)  Shane Woodard presents for evaluation and adjustement of recently inserted upper and lower complete denture relines.  SUBJECTIVE: Patient is not complaining of any denture irritation.  OBJECTIVE: No sign of denture ulcerations or erythema.  Procedure: Pressure indicating paste applied to dentures. Adjustments made as needed. [place]. Occlusion evaluated and adjustments made as needed for Centric Relation and protrusive strokes. Patient accepts results. Use salt water rinses as needed to aid healing. Patient to keep dentures out if sore spots develop. Return to clinic as scheduled for denture adjustment.  Call if problems arise before then. Patient dismissed in stable condition.   Charlynne Pander, DDS

## 2012-07-01 NOTE — Patient Instructions (Addendum)
1.  Diagnosis:  History of myeloma. 2.  Treatment:  History of induction chemo; bone marrow transplant.  Now with maintenance chemo Revlimid and IV Zometa to decrease risk of bone fracture.  3.  Follow up:  In about 4 months.

## 2012-07-02 ENCOUNTER — Telehealth: Payer: Self-pay | Admitting: Oncology

## 2012-07-02 ENCOUNTER — Ambulatory Visit (HOSPITAL_BASED_OUTPATIENT_CLINIC_OR_DEPARTMENT_OTHER): Payer: Medicaid Other

## 2012-07-02 ENCOUNTER — Other Ambulatory Visit (HOSPITAL_BASED_OUTPATIENT_CLINIC_OR_DEPARTMENT_OTHER): Payer: Medicaid Other | Admitting: Lab

## 2012-07-02 ENCOUNTER — Ambulatory Visit (HOSPITAL_BASED_OUTPATIENT_CLINIC_OR_DEPARTMENT_OTHER): Payer: Medicaid Other | Admitting: Oncology

## 2012-07-02 VITALS — BP 113/76 | HR 86 | Temp 96.7°F | Resp 18 | Ht 65.0 in | Wt 162.7 lb

## 2012-07-02 DIAGNOSIS — C9000 Multiple myeloma not having achieved remission: Secondary | ICD-10-CM

## 2012-07-02 DIAGNOSIS — R079 Chest pain, unspecified: Secondary | ICD-10-CM

## 2012-07-02 DIAGNOSIS — T451X5A Adverse effect of antineoplastic and immunosuppressive drugs, initial encounter: Secondary | ICD-10-CM

## 2012-07-02 DIAGNOSIS — M898X9 Other specified disorders of bone, unspecified site: Secondary | ICD-10-CM

## 2012-07-02 DIAGNOSIS — D6481 Anemia due to antineoplastic chemotherapy: Secondary | ICD-10-CM

## 2012-07-02 DIAGNOSIS — D6959 Other secondary thrombocytopenia: Secondary | ICD-10-CM

## 2012-07-02 LAB — COMPREHENSIVE METABOLIC PANEL (CC13)
Alkaline Phosphatase: 77 U/L (ref 40–150)
BUN: 6.1 mg/dL — ABNORMAL LOW (ref 7.0–26.0)
CO2: 24 mEq/L (ref 22–29)
Creatinine: 0.9 mg/dL (ref 0.7–1.3)
Glucose: 156 mg/dl — ABNORMAL HIGH (ref 70–99)
Total Bilirubin: 0.35 mg/dL (ref 0.20–1.20)

## 2012-07-02 LAB — CBC WITH DIFFERENTIAL/PLATELET
Basophils Absolute: 0 10*3/uL (ref 0.0–0.1)
Eosinophils Absolute: 5.3 10*3/uL — ABNORMAL HIGH (ref 0.0–0.5)
HCT: 33.5 % — ABNORMAL LOW (ref 38.4–49.9)
HGB: 11.1 g/dL — ABNORMAL LOW (ref 13.0–17.1)
LYMPH%: 18.5 % (ref 14.0–49.0)
MCV: 95 fL (ref 79.3–98.0)
MONO%: 3.8 % (ref 0.0–14.0)
NEUT#: 1.3 10*3/uL — ABNORMAL LOW (ref 1.5–6.5)
NEUT%: 15.2 % — ABNORMAL LOW (ref 39.0–75.0)
Platelets: 72 10*3/uL — ABNORMAL LOW (ref 140–400)
RBC: 3.53 10*6/uL — ABNORMAL LOW (ref 4.20–5.82)

## 2012-07-02 MED ORDER — OXYCODONE HCL 5 MG PO CAPS: 5.0000 mg | ORAL_CAPSULE | Freq: Two times a day (BID) | ORAL | Status: DC | PRN

## 2012-07-02 MED ORDER — ZOLEDRONIC ACID 4 MG/100ML IV SOLN
4.0000 mg | Freq: Once | INTRAVENOUS | Status: AC
  Administered 2012-07-02: 4 mg via INTRAVENOUS
  Filled 2012-07-02: qty 100

## 2012-07-02 MED ORDER — SODIUM CHLORIDE 0.9 % IV SOLN
Freq: Once | INTRAVENOUS | Status: AC
  Administered 2012-07-02: 10:00:00 via INTRAVENOUS

## 2012-07-02 MED ORDER — HEPARIN SOD (PORK) LOCK FLUSH 100 UNIT/ML IV SOLN
500.0000 [IU] | Freq: Once | INTRAVENOUS | Status: DC | PRN
  Filled 2012-07-02: qty 5

## 2012-07-02 NOTE — Progress Notes (Signed)
[facility]  Telephone:(336) [phone] Fax:(336) [phone]   OFFICE PROGRESS NOTE   Cc:  HUSAIN,KARRAR, MD  DIAGNOSIS: IgG lambda multiple myeloma; presented with anemia, lytic bone lesions. Initial M-spike was 5.1gm/dL; free serum lambda of 2.44 mg/dL; Ig G 7760 mg/dL; [id number] of 2.47. Bone marrow biopsy showed 35% plasma cell; cytogenetics per FISH was positive for t(11;14).   PAST THERAPY: started on 03/19/11 [name] 1.3mg /m2 SQ d1, 4,8,11 q28 day; Revlimid 25 mg PO d1-21 q28day; Dexamethasone 40mg  PO weekly (even of week off of chemo). He was also on Acyclovir 400mg  PO BID; Bactrim DS Mon/Wed/Fri; Lovenox 40mg  SQ daily. He was also on qmonth Zometa for bone protection. 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   CURRENT THERAPY:  He has been on maintenance Relimid now at 5mg  PO daily (by [facility]) and monthly Zometa (with [place]) since late 2013.   INTERVAL HISTORY: Shane Woodard 52 y.o. male returns for regular follow up by himself.  He still has moderate bilateral posterior lower rib pain.  Pain is stable for the past few months.  Work up by [employer] and EGD were negative.  Pain worsened with sitting more than 1 hour.  With exertion, rib pain also comes on.  Pain is relieved with Flexeril and Oxycodone.  He has not tried acupuncture or physical therapy.  He denied any problem between him and his wife.   Patient denies fever, anorexia, weight loss, fatigue, headache, visual changes, confusion, drenching night sweats, palpable lymph node swelling, mucositis, odynophagia, dysphagia, nausea vomiting, jaundice, chest pain, palpitation, shortness of breath, dyspnea on exertion, productive cough, gum bleeding, epistaxis, hematemesis, hemoptysis, abdominal pain, abdominal swelling, early satiety, melena, hematochezia, hematuria, skin rash, spontaneous bleeding, joint swelling, heat or cold intolerance, bowel bladder incontinence, back pain, focal motor weakness,  paresthesia, depression.      Past Medical History  Diagnosis Date  . Anemia   . Arthritis   . Multiple myeloma(203.0)   . S/P radiation therapy 05/03/11 - 05/04/11    Right Inferior/Anterior Rib Cage, Left Inferior/Anterior Rib Cage and Left Scapula/Shoulder  . Maintenance antineoplastic chemotherapy     [name], [name]  . S/P bone marrow transplant     [facility]    Past Surgical History  Procedure Laterality Date  . Left thumb surgery  11/26/2003  . Multiple extractions with alveoloplasty  03/28/2011    Procedure: MULTIPLE EXTRACION WITH ALVEOLOPLASTY;  Surgeon: Charlynne Pander, DDS;  Location: [facility] ORS;  Service: Oral Surgery;  Laterality: N/A;  Extraction of tooth #'s 2,3,4,5,6,7,8,9,10,11,12,13,14,15,17, 20,21,22,23,24,25,26,27,28, 31, and 32 with alveoloplasty and mandibular left torus reduction.    Current Outpatient Prescriptions  Medication Sig Dispense Refill  . acyclovir (ZOVIRAX) 800 MG tablet Take 800 mg by mouth 2 (two) times daily. Help prevent Viral infection      . aspirin 325 MG tablet Take 325 mg by mouth daily.      . cyclobenzaprine (FLEXERIL) 5 MG tablet TAKE 1 TABLETS BY MOUTH THREE TIMES A DAY AS NEEDED FOR MUSCLE SPASMS  90 tablet  0  . docusate sodium (COLACE) 100 MG capsule Take 100 mg by mouth 3 (three) times daily as needed. Take as needed for constipation      . escitalopram (LEXAPRO) 10 MG tablet TAKE 1 TABLET BY MOUTH DAILY  30 tablet  2  . folic acid (FOLVITE) 1 MG tablet Take 1 mg by mouth daily.      Marland Kitchen lenalidomide (REVLIMID) 5 MG  capsule Take 10 mg by mouth daily.      . ondansetron (ZOFRAN) 8 MG tablet TAKE 1 TABLET BY MOUTH EVERY 8 HOURS AS NEEDED TO PREVENT OR TREAT NAUSEA  30 tablet  5  . ondansetron (ZOFRAN) 8 MG tablet TAKE 1 TABLET BY MOUTH TWICE A DAY THE DAY AFTER CHEMO X 2 DAYS, THEN 1 TABLET TWICE A DAY AS NEEDED FOR NAUSEA/VOMITING  30 tablet  1  . oxycodone (OXY-IR) 5 MG capsule Take 1 capsule (5 mg total) by  mouth every 12 (twelve) hours as needed for pain. As needed for pain  60 capsule  0  . prochlorperazine (COMPAZINE) 10 MG tablet Take 1 tablet (10 mg total) by mouth every 6 (six) hours as needed.  30 tablet  2   No current facility-administered medications for this visit.    ALLERGIES:  has No Known Allergies.  REVIEW OF SYSTEMS:  The rest of the 14-point review of system was negative.   Filed Vitals:   07/02/12 0927  BP: 113/76  Pulse: 86  Temp: 96.7 F (35.9 C)  Resp: 18   Wt Readings from Last 3 Encounters:  07/02/12 162 lb 11.2 oz (73.8 kg)  01/02/12 155 lb 9.6 oz (70.58 kg)  11/21/11 148 lb 4.8 oz (67.268 kg)   ECOG Performance status: 1  PHYSICAL EXAMINATION:   General:  well-nourished man,  in no acute distress.  Eyes:  no scleral icterus.  ENT:  There were no oropharyngeal lesions.  Neck was without thyromegaly.  Lymphatics:  Negative cervical, supraclavicular or axillary adenopathy.  Respiratory: lungs were clear bilaterally without wheezing or crackles.  Cardiovascular:  Regular rate and rhythm, S1/S2, without murmur, rub or gallop.  There was no pedal edema.  GI:  abdomen was soft, flat, nontender, nondistended, without organomegaly.  Muscoloskeletal:  no spinal tenderness of palpation of vertebral spine. There was no pain to palpation of bilateral, posterior ribs.  Skin exam was without echymosis, petichae.  Neuro exam was nonfocal.  Patient was able to get on and off exam table without assistance.  Gait was normal.  Patient was alert  and oriented.  Attention was good.   Language was appropriate.  Mood was normal without depression.  Speech was not pressured.  Thought content was not tangential.     LABORATORY/RADIOLOGY DATA:  Lab Results  Component Value Date   WBC 8.4 07/02/2012   HGB 11.1* 07/02/2012   HCT 33.5* 07/02/2012   PLT 72* 07/02/2012   GLUCOSE 112* 06/04/2012   ALKPHOS 100 01/02/2012   ALT 11 01/02/2012   AST 10 01/02/2012   NA 141 06/04/2012   K 3.6 06/04/2012    CL 107 06/04/2012   CREATININE 0.8 06/04/2012   BUN 6.0* 06/04/2012   CO2 25 06/04/2012   INR 1.09 04/10/2011     ASSESSMENT AND PLAN:   1. IgG lambda multiple myeloma:  -s/p 4 cycles of chemo Revlimid/[name]/Dex with very good partial response. He is also s/p one auto BMT on 10/02/2011 at [facility].  He is on maintenance Revlimid 5mg  PO daily continuous with [facility].  He has follow up with them on a regular basis.   2. Compression fracture: s/p kyphoplasty x2 and radiation. He still has bilateral rib pain.  Extensive work up with [employer], EGD were non revealing.  Pain is most likely due to myeloma.  He is taking Oxycodone about twice daily.   He also has Flexeril prn.  I advised him to consider acupuncture  or physical therapy.  He would like to try acupuncture first.  I advised him to continue monthly Zometa.  I refilled his Oxycodone today.   4. Anemia/thrombocytopenia: Due to chemo for myeloma.  There is no active bleeding; there is no indication for transfusion.   5.  Follow up: in about 4 months.    I informed Mr. Riojas that I am leaving the practice.  [facility] to follow up with a new provider when he returns.     The length of time of the face-to-face encounter was 20   minutes. More than 50% of time was spent counseling and coordination of care.

## 2012-07-02 NOTE — Telephone Encounter (Signed)
gv and printed appt sched and avs for pt...emailed MB to add tx.   °

## 2012-07-02 NOTE — Patient Instructions (Signed)
Zoledronic Acid injection (Hypercalcemia, Oncology) What is this medicine? ZOLEDRONIC ACID (ZOE le dron ik AS id) lowers the amount of calcium loss from bone. It is used to treat too much calcium in your blood from cancer. It is also used to prevent complications of cancer that has spread to the bone. This medicine may be used for other purposes; ask your health care provider or pharmacist if you have questions. What should I tell my health care provider before I take this medicine? They need to know if you have any of these conditions: -aspirin-sensitive asthma -dental disease -kidney disease -an unusual or allergic reaction to zoledronic acid, other medicines, foods, dyes, or preservatives -pregnant or trying to get pregnant -breast-feeding How should I use this medicine? This medicine is for infusion into a vein. It is given by a health care professional in a hospital or clinic setting. Talk to your pediatrician regarding the use of this medicine in children. Special care may be needed. Overdosage: If you think you have taken too much of this medicine contact a poison control center or emergency room at once. NOTE: This medicine is only for you. Do not share this medicine with others. What if I miss a dose? It is important not to miss your dose. Call your doctor or health care professional if you are unable to keep an appointment. What may interact with this medicine? -certain antibiotics given by injection -NSAIDs, medicines for pain and inflammation, like ibuprofen or naproxen -some diuretics like bumetanide, furosemide -teriparatide -thalidomide This list may not describe all possible interactions. Give your health care provider a list of all the medicines, herbs, non-prescription drugs, or dietary supplements you use. Also tell them if you smoke, drink alcohol, or use illegal drugs. Some items may interact with your medicine. What should I watch for while using this medicine? Visit  your doctor or health care professional for regular checkups. It may be some time before you see the benefit from this medicine. Do not stop taking your medicine unless your doctor tells you to. Your doctor may order blood tests or other tests to see how you are doing. Women should inform their doctor if they wish to become pregnant or think they might be pregnant. There is a potential for serious side effects to an unborn child. Talk to your health care professional or pharmacist for more information. You should make sure that you get enough calcium and vitamin D while you are taking this medicine. Discuss the foods you eat and the vitamins you take with your health care professional. Some people who take this medicine have severe bone, joint, and/or muscle pain. This medicine may also increase your risk for a broken thigh bone. Tell your doctor right away if you have pain in your upper leg or groin. Tell your doctor if you have any pain that does not go away or that gets worse. What side effects may I notice from receiving this medicine? Side effects that you should report to your doctor or health care professional as soon as possible: -allergic reactions like skin rash, itching or hives, swelling of the face, lips, or tongue -anxiety, confusion, or depression -breathing problems -changes in vision -feeling faint or lightheaded, falls -jaw burning, cramping, pain -muscle cramps, stiffness, or weakness -trouble passing urine or change in the amount of urine Side effects that usually do not require medical attention (report to your doctor or health care professional if they continue or are bothersome): -bone, joint, or muscle pain -  fever -hair loss -irritation at site where injected -loss of appetite -nausea, vomiting -stomach upset -tired This list may not describe all possible side effects. Call your doctor for medical advice about side effects. You may report side effects to FDA at  1-800-FDA-1088. Where should I keep my medicine? This drug is given in a hospital or clinic and will not be stored at home. NOTE: This sheet is a summary. It may not cover all possible information. If you have questions about this medicine, talk to your doctor, pharmacist, or health care provider.  2013, Elsevier/Gold Standard. (08/11/2010 9:06:58 AM)  

## 2012-07-03 ENCOUNTER — Other Ambulatory Visit: Payer: Self-pay | Admitting: Oncology

## 2012-07-04 LAB — KAPPA/LAMBDA LIGHT CHAINS
Kappa:Lambda Ratio: 0.72 (ref 0.26–1.65)
Lambda Free Lght Chn: 2.6 mg/dL (ref 0.57–2.63)

## 2012-07-04 LAB — PROTEIN ELECTROPHORESIS, SERUM
Albumin ELP: 57.5 % (ref 55.8–66.1)
Alpha-1-Globulin: 5.2 % — ABNORMAL HIGH (ref 2.9–4.9)
Beta 2: 3.5 % (ref 3.2–6.5)
Beta Globulin: 5.3 % (ref 4.7–7.2)
Total Protein, Serum Electrophoresis: 6.3 g/dL (ref 6.0–8.3)

## 2012-07-07 ENCOUNTER — Telehealth: Payer: Self-pay | Admitting: *Deleted

## 2012-07-07 NOTE — Telephone Encounter (Signed)
Pt was in lobby,  Thought he had appt for lab work today.  He had copy of orders from [facility] that said he needs labs today.  Explained to pt we changed those dates as he just had labs here last week on 5/07.  Gave copy of schedule,  Next lab on 6/04 and then 7/02.  Explained [facility] wants labs once monthly.  Pt verbalized understanding,  States will return on 6/04 as scheduled.

## 2012-07-17 ENCOUNTER — Encounter (HOSPITAL_COMMUNITY): Payer: Self-pay | Admitting: Dentistry

## 2012-07-30 ENCOUNTER — Other Ambulatory Visit: Payer: Self-pay

## 2012-07-30 ENCOUNTER — Other Ambulatory Visit: Payer: Self-pay | Admitting: *Deleted

## 2012-07-30 ENCOUNTER — Ambulatory Visit (HOSPITAL_BASED_OUTPATIENT_CLINIC_OR_DEPARTMENT_OTHER): Payer: Medicaid Other

## 2012-07-30 ENCOUNTER — Other Ambulatory Visit: Payer: Medicaid - Dental | Admitting: Lab

## 2012-07-30 VITALS — BP 115/69 | HR 80 | Temp 97.3°F

## 2012-07-30 DIAGNOSIS — C9000 Multiple myeloma not having achieved remission: Secondary | ICD-10-CM

## 2012-07-30 DIAGNOSIS — M898X9 Other specified disorders of bone, unspecified site: Secondary | ICD-10-CM

## 2012-07-30 MED ORDER — OXYCODONE HCL 5 MG PO CAPS: 5.0000 mg | ORAL_CAPSULE | Freq: Two times a day (BID) | ORAL | Status: DC | PRN

## 2012-07-30 MED ORDER — SODIUM CHLORIDE 0.9 % IV SOLN
Freq: Once | INTRAVENOUS | Status: AC
  Administered 2012-07-30: 10:00:00 via INTRAVENOUS

## 2012-07-30 MED ORDER — ZOLEDRONIC ACID 4 MG/100ML IV SOLN
4.0000 mg | Freq: Once | INTRAVENOUS | Status: AC
  Administered 2012-07-30: 4 mg via INTRAVENOUS
  Filled 2012-07-30: qty 100

## 2012-07-30 NOTE — Telephone Encounter (Signed)
Rx for oxycodone given to pt in Infusion room.

## 2012-07-30 NOTE — Patient Instructions (Signed)
[facility] Discharge Instructions for Patients  Today you received the following : Zometa  BELOW ARE SYMPTOMS THAT SHOULD BE REPORTED IMMEDIATELY:  *FEVER GREATER THAN 100.5 F  *CHILLS WITH OR WITHOUT FEVER  NAUSEA AND VOMITING THAT IS NOT CONTROLLED WITH YOUR NAUSEA MEDICATION  *UNUSUAL SHORTNESS OF BREATH  *UNUSUAL BRUISING OR BLEEDING  TENDERNESS IN MOUTH AND THROAT WITH OR WITHOUT PRESENCE OF ULCERS  *URINARY PROBLEMS  *BOWEL PROBLEMS  UNUSUAL RASH Items with * indicate a potential emergency and should be followed up as soon as possible.  Feel free to call the clinic you have any questions or concerns. The clinic phone number is [phone].

## 2012-08-05 ENCOUNTER — Other Ambulatory Visit: Payer: Self-pay | Admitting: Oncology

## 2012-08-05 MED ORDER — CYCLOBENZAPRINE HCL 5 MG PO TABS: ORAL_TABLET | ORAL | Status: DC

## 2012-08-21 ENCOUNTER — Other Ambulatory Visit: Payer: Self-pay | Admitting: *Deleted

## 2012-08-27 ENCOUNTER — Ambulatory Visit (HOSPITAL_BASED_OUTPATIENT_CLINIC_OR_DEPARTMENT_OTHER): Payer: Medicaid Other

## 2012-08-27 ENCOUNTER — Other Ambulatory Visit (HOSPITAL_BASED_OUTPATIENT_CLINIC_OR_DEPARTMENT_OTHER): Payer: Medicaid Other | Admitting: Lab

## 2012-08-27 VITALS — BP 119/71 | HR 89 | Temp 97.7°F | Resp 18

## 2012-08-27 DIAGNOSIS — C9000 Multiple myeloma not having achieved remission: Secondary | ICD-10-CM

## 2012-08-27 LAB — BASIC METABOLIC PANEL (CC13)
CO2: 26 mEq/L (ref 22–29)
Chloride: 109 mEq/L (ref 98–109)
Glucose: 109 mg/dl (ref 70–140)
Potassium: 3.7 mEq/L (ref 3.5–5.1)
Sodium: 139 mEq/L (ref 136–145)

## 2012-08-27 MED ORDER — ZOLEDRONIC ACID 4 MG/100ML IV SOLN
4.0000 mg | Freq: Once | INTRAVENOUS | Status: AC
  Administered 2012-08-27: 4 mg via INTRAVENOUS
  Filled 2012-08-27: qty 100

## 2012-08-27 MED ORDER — SODIUM CHLORIDE 0.9 % IV SOLN
Freq: Once | INTRAVENOUS | Status: AC
  Administered 2012-08-27: 10:00:00 via INTRAVENOUS

## 2012-09-03 ENCOUNTER — Other Ambulatory Visit: Payer: Self-pay | Admitting: Oncology

## 2012-09-09 ENCOUNTER — Other Ambulatory Visit: Payer: Self-pay | Admitting: *Deleted

## 2012-09-09 DIAGNOSIS — C9000 Multiple myeloma not having achieved remission: Secondary | ICD-10-CM

## 2012-09-09 MED ORDER — CYCLOBENZAPRINE HCL 5 MG PO TABS: ORAL_TABLET | ORAL | Status: DC

## 2012-09-18 ENCOUNTER — Other Ambulatory Visit: Payer: Self-pay | Admitting: *Deleted

## 2012-09-24 ENCOUNTER — Ambulatory Visit (HOSPITAL_BASED_OUTPATIENT_CLINIC_OR_DEPARTMENT_OTHER): Payer: Medicaid Other

## 2012-09-24 ENCOUNTER — Other Ambulatory Visit (HOSPITAL_BASED_OUTPATIENT_CLINIC_OR_DEPARTMENT_OTHER): Payer: Medicaid Other | Admitting: Lab

## 2012-09-24 ENCOUNTER — Other Ambulatory Visit: Payer: Self-pay | Admitting: Medical Oncology

## 2012-09-24 VITALS — BP 132/78 | HR 87 | Temp 98.6°F

## 2012-09-24 DIAGNOSIS — C9000 Multiple myeloma not having achieved remission: Secondary | ICD-10-CM

## 2012-09-24 LAB — MANUAL DIFFERENTIAL
Basophil: 2 % (ref 0–2)
EOS: 63 % — ABNORMAL HIGH (ref 0–7)
MONO: 1 % (ref 0–14)
Metamyelocytes: 2 % — ABNORMAL HIGH (ref 0–0)
Myelocytes: 7 % — ABNORMAL HIGH (ref 0–0)
Other Cell: 0 % (ref 0–0)
PLT EST: DECREASED
SEG: 7 % — ABNORMAL LOW (ref 38–77)

## 2012-09-24 LAB — COMPREHENSIVE METABOLIC PANEL (CC13)
ALT: 7 U/L (ref 0–55)
AST: 10 U/L (ref 5–34)
Albumin: 4.1 g/dL (ref 3.5–5.0)
Alkaline Phosphatase: 78 U/L (ref 40–150)
Potassium: 4.2 mEq/L (ref 3.5–5.1)
Sodium: 140 mEq/L (ref 136–145)
Total Protein: 8 g/dL (ref 6.4–8.3)

## 2012-09-24 LAB — CBC WITH DIFFERENTIAL/PLATELET
MCHC: 31 g/dL — ABNORMAL LOW (ref 32.0–36.0)
RBC: 3.86 10*6/uL — ABNORMAL LOW (ref 4.20–5.82)
RDW: 20.4 % — ABNORMAL HIGH (ref 11.0–14.6)
WBC: 19.1 10*3/uL — ABNORMAL HIGH (ref 4.0–10.3)

## 2012-09-24 MED ORDER — ZOLEDRONIC ACID 4 MG/5ML IV CONC
4.0000 mg | Freq: Once | INTRAVENOUS | Status: AC
  Administered 2012-09-24: 4 mg via INTRAVENOUS
  Filled 2012-09-24: qty 5

## 2012-09-24 MED ORDER — SODIUM CHLORIDE 0.9 % IV SOLN
Freq: Once | INTRAVENOUS | Status: AC
  Administered 2012-09-24: 10:00:00 via INTRAVENOUS

## 2012-09-24 NOTE — Patient Instructions (Addendum)
Zoledronic Acid injection (Hypercalcemia, Oncology) What is this medicine? ZOLEDRONIC ACID (ZOE le dron ik AS id) lowers the amount of calcium loss from bone. It is used to treat too much calcium in your blood from cancer. It is also used to prevent complications of cancer that has spread to the bone. This medicine may be used for other purposes; ask your health care provider or pharmacist if you have questions. What should I tell my health care provider before I take this medicine? They need to know if you have any of these conditions: -aspirin-sensitive asthma -dental disease -kidney disease -an unusual or allergic reaction to zoledronic acid, other medicines, foods, dyes, or preservatives -pregnant or trying to get pregnant -breast-feeding How should I use this medicine? This medicine is for infusion into a vein. It is given by a health care professional in a hospital or clinic setting. Talk to your pediatrician regarding the use of this medicine in children. Special care may be needed. Overdosage: If you think you have taken too much of this medicine contact a poison control center or emergency room at once. NOTE: This medicine is only for you. Do not share this medicine with others. What if I miss a dose? It is important not to miss your dose. Call your doctor or health care professional if you are unable to keep an appointment. What may interact with this medicine? -certain antibiotics given by injection -NSAIDs, medicines for pain and inflammation, like ibuprofen or naproxen -some diuretics like bumetanide, furosemide -teriparatide -thalidomide This list may not describe all possible interactions. Give your health care provider a list of all the medicines, herbs, non-prescription drugs, or dietary supplements you use. Also tell them if you smoke, drink alcohol, or use illegal drugs. Some items may interact with your medicine. What should I watch for while using this medicine? Visit  your doctor or health care professional for regular checkups. It may be some time before you see the benefit from this medicine. Do not stop taking your medicine unless your doctor tells you to. Your doctor may order blood tests or other tests to see how you are doing. Women should inform their doctor if they wish to become pregnant or think they might be pregnant. There is a potential for serious side effects to an unborn child. Talk to your health care professional or pharmacist for more information. You should make sure that you get enough calcium and vitamin D while you are taking this medicine. Discuss the foods you eat and the vitamins you take with your health care professional. Some people who take this medicine have severe bone, joint, and/or muscle pain. This medicine may also increase your risk for a broken thigh bone. Tell your doctor right away if you have pain in your upper leg or groin. Tell your doctor if you have any pain that does not go away or that gets worse. What side effects may I notice from receiving this medicine? Side effects that you should report to your doctor or health care professional as soon as possible: -allergic reactions like skin rash, itching or hives, swelling of the face, lips, or tongue -anxiety, confusion, or depression -breathing problems -changes in vision -feeling faint or lightheaded, falls -jaw burning, cramping, pain -muscle cramps, stiffness, or weakness -trouble passing urine or change in the amount of urine Side effects that usually do not require medical attention (report to your doctor or health care professional if they continue or are bothersome): -bone, joint, or muscle pain -  fever -hair loss -irritation at site where injected -loss of appetite -nausea, vomiting -stomach upset -tired This list may not describe all possible side effects. Call your doctor for medical advice about side effects. You may report side effects to FDA at  1-800-FDA-1088. Where should I keep my medicine? This drug is given in a hospital or clinic and will not be stored at home. NOTE: This sheet is a summary. It may not cover all possible information. If you have questions about this medicine, talk to your doctor, pharmacist, or health care provider.  2012, Elsevier/Gold Standard. (08/11/2010 9:06:58 AM) 

## 2012-09-25 ENCOUNTER — Telehealth: Payer: Self-pay | Admitting: *Deleted

## 2012-09-25 DIAGNOSIS — C9000 Multiple myeloma not having achieved remission: Secondary | ICD-10-CM

## 2012-09-25 NOTE — Telephone Encounter (Signed)
Shane Woodard states pt says he needs [facility] labs done per [facility].  Received orders from Dr. Greggory Stallion for weekly labs.  See pt coordination notes.  Lab orders placed and POF sent to scheduler.

## 2012-09-29 ENCOUNTER — Telehealth: Payer: Self-pay | Admitting: Hematology and Oncology

## 2012-09-29 NOTE — Telephone Encounter (Signed)
S/w the pt and he is aware of his appts on 10/01/2012@9 :45am. Sent luwanna an email to have the interpreter set up for the aug appts

## 2012-10-01 ENCOUNTER — Other Ambulatory Visit: Payer: Self-pay

## 2012-10-01 ENCOUNTER — Other Ambulatory Visit (HOSPITAL_BASED_OUTPATIENT_CLINIC_OR_DEPARTMENT_OTHER): Payer: Medicaid Other | Admitting: Lab

## 2012-10-01 ENCOUNTER — Ambulatory Visit (HOSPITAL_BASED_OUTPATIENT_CLINIC_OR_DEPARTMENT_OTHER): Payer: Medicaid Other

## 2012-10-01 ENCOUNTER — Encounter (HOSPITAL_COMMUNITY)
Admission: RE | Admit: 2012-10-01 | Discharge: 2012-10-01 | Disposition: A | Discharge status: Home / Self Care | Payer: Medicaid Other | Source: Ambulatory Visit | Attending: Hematology and Oncology | Admitting: Hematology and Oncology

## 2012-10-01 VITALS — BP 112/69 | HR 87 | Temp 97.3°F | Resp 18

## 2012-10-01 DIAGNOSIS — C9000 Multiple myeloma not having achieved remission: Secondary | ICD-10-CM

## 2012-10-01 DIAGNOSIS — D649 Anemia, unspecified: Secondary | ICD-10-CM | POA: Insufficient documentation

## 2012-10-01 LAB — CBC WITH DIFFERENTIAL/PLATELET
BASO%: 2.9 % — ABNORMAL HIGH (ref 0.0–2.0)
EOS%: 46.4 % — ABNORMAL HIGH (ref 0.0–7.0)
HCT: 27.8 % — ABNORMAL LOW (ref 38.4–49.9)
LYMPH%: 16 % (ref 14.0–49.0)
MCH: 25.6 pg — ABNORMAL LOW (ref 27.2–33.4)
MCHC: 30.6 g/dL — ABNORMAL LOW (ref 32.0–36.0)
NEUT%: 25.2 % — ABNORMAL LOW (ref 39.0–75.0)
Platelets: 15 10*3/uL — ABNORMAL LOW (ref 140–400)
lymph#: 3.1 10*3/uL (ref 0.9–3.3)

## 2012-10-01 MED ORDER — SODIUM CHLORIDE 0.9 % IJ SOLN
10.0000 mL | INTRAMUSCULAR | Status: DC | PRN
  Filled 2012-10-01: qty 10

## 2012-10-01 MED ORDER — DIPHENHYDRAMINE HCL 25 MG PO CAPS
25.0000 mg | ORAL_CAPSULE | Freq: Once | ORAL | Status: AC
  Administered 2012-10-01: 25 mg via ORAL

## 2012-10-01 MED ORDER — ACETAMINOPHEN 325 MG PO TABS
650.0000 mg | ORAL_TABLET | Freq: Once | ORAL | Status: AC
  Administered 2012-10-01: 650 mg via ORAL

## 2012-10-01 MED ORDER — SODIUM CHLORIDE 0.9 % IV SOLN
250.0000 mL | Freq: Once | INTRAVENOUS | Status: AC
  Administered 2012-10-01: 250 mL via INTRAVENOUS

## 2012-10-01 NOTE — Patient Instructions (Signed)
Platelet Transfusion Information This is information about transfusions of platelets. Platelets are tiny cells made by the bone marrow and found in the blood. When a blood vessel is damaged platelets rush to the damaged area to help form a clot. This begins the healing process. When platelets get very low your blood may have trouble clotting. This may be from:  Illness.  Blood disorder.  Chemotherapy to treat cancer. Often lower platelet counts do not usually cause problems.  Platelets usually last for 7 to 10 days. If they are not used not used in an injury, they are broken down by the liver or spleen. Symptoms of low platelet count include:  Nosebleeds.  Bleeding gums.  Heavy periods.  Bruising and tiny blood spots in the skin.  Pin point spots of bleeding are called (petechiae).  Larger bruises (purpura).  Bleeding can be more serious if it happens in the brain or bowel. Platelet transfusions are often used to keep the platelet count at an acceptable level. Serious bleeding due to low platelets is uncommon. RISKS AND COMPLICATIONS Severe side effects from platelet transfusions are uncommon. Minor reactions may include:  Itching.  Rashes.  High temperature and shivering. Medications are available to stop transfusion reactions. Let your caregivers know if you develop any of the above problems.  If you are having platelet transfusions frequently they may get less effective. This is called becoming refractory to platelets. It is uncommon. This can happen from non-immune causes and immune causes. Non-immune causes include:  High temperatures.  Some medications.  An enlarged spleen. Immune causes happen when your body discovers the platelets are not your own and begin making antibodies against them. The antibodies kill the platelets quickly. Even with platelet transfusions you may still notice problems with bleeding or bruising. Let your caregivers know about this. Other things  can be done to help if this happens.  BEFORE THE PROCEDURE   Your doctors will check your platelet count regularly.  If the platelet count is too low it may be necessary to have a platelet transfusion.  This is more important before certain procedures with a risk of bleeding such as a spinal tap.  Platelet transfusion reduces the risk of bleeding during or after the procedure.  Except in emergencies, giving a transfusion requires a written consent. Before blood is taken from a donor, a complete history is taken to make sure the person has no history of previous diseases, nor engages in risky social behavior. Examples of this are intravenous drug use or sexual activity with multiple partners. This could lead to infected blood or blood products being used. This history is done even in spite of the extensive testing to make sure the blood is safe. All blood products transfused are tested to make sure it is a match for the person getting the blood. It is also checked for infections. Blood is the safest it has ever been. The risk of getting an infection is very low. PROCEDURE  The platelets are stored in small plastic bags which are kept at a low temperature.  Each bag is called a unit and sometimes two units are given. They are given through an intravenous line by drip infusion over about one half hour.  Usually blood is collected from multiple people to get enough to transfuse.  Sometimes, the platelets are collected from a single person. This is done using a special machine that separates the platelets from the blood. The machine is called an apheresis machine. Platelets collected   in this way are called apheresed platelets. Apheresed platelets reduce the risk of becoming sensitive to the platelets. This lowers the chances of having a transfusion reaction.  As it only takes a short time to give the platelets, this treatment can be given in an outpatients department. Platelets can also be given  before or after other treatments. SEEK IMMEDIATE MEDICAL CARE IF: Any of the following symptoms over the next 12 hours or several days:  Shaking chills.  Fever with a temperature greater than 102 F (38.9 C) develops.  Back pain or muscle pain.  People around you feel you are not acting correctly, or you are confused.  Blood in the urine or bowel movements or bleeding from any place in your body.  Shortness of breath, or difficulty breathing.  Dizziness.  Fainting.  You break out in a rash or develop hives.  You have a decrease in the amount of urine you are putting out, or the urine turns a dark color or changes to pink, red, or brown.  A severe headache or stiff neck.  Bruising more easily. Document Released: 12/10/2006 Document Revised: 05/07/2011 Document Reviewed: 12/10/2006 ExitCare Patient Information 2014 [employer].  

## 2012-10-02 LAB — PREPARE PLATELET PHERESIS: Unit division: 0

## 2012-10-08 ENCOUNTER — Other Ambulatory Visit (HOSPITAL_BASED_OUTPATIENT_CLINIC_OR_DEPARTMENT_OTHER): Payer: Medicaid Other | Admitting: Lab

## 2012-10-08 ENCOUNTER — Encounter (HOSPITAL_COMMUNITY): Payer: Self-pay | Admitting: Dentistry

## 2012-10-08 ENCOUNTER — Ambulatory Visit (HOSPITAL_BASED_OUTPATIENT_CLINIC_OR_DEPARTMENT_OTHER): Payer: Medicaid Other

## 2012-10-08 ENCOUNTER — Ambulatory Visit (HOSPITAL_COMMUNITY): Payer: Medicaid - Dental | Admitting: Dentistry

## 2012-10-08 VITALS — BP 114/68 | HR 93 | Temp 98.7°F

## 2012-10-08 VITALS — BP 108/67 | HR 89 | Temp 98.1°F | Resp 18

## 2012-10-08 DIAGNOSIS — Z972 Presence of dental prosthetic device (complete) (partial): Secondary | ICD-10-CM

## 2012-10-08 DIAGNOSIS — D649 Anemia, unspecified: Secondary | ICD-10-CM

## 2012-10-08 DIAGNOSIS — K08109 Complete loss of teeth, unspecified cause, unspecified class: Secondary | ICD-10-CM

## 2012-10-08 DIAGNOSIS — Z463 Encounter for fitting and adjustment of dental prosthetic device: Secondary | ICD-10-CM

## 2012-10-08 DIAGNOSIS — C9000 Multiple myeloma not having achieved remission: Secondary | ICD-10-CM

## 2012-10-08 LAB — MANUAL DIFFERENTIAL
ANC (CHCC manual diff): 4.1 10*3/uL (ref 1.5–6.5)
Basophil: 1 % (ref 0–2)
Blasts: 0 % (ref 0–0)
EOS: 62 % — ABNORMAL HIGH (ref 0–7)
Metamyelocytes: 5 % — ABNORMAL HIGH (ref 0–0)
PLT EST: DECREASED
PROMYELO: 0 % (ref 0–0)

## 2012-10-08 LAB — HOLD TUBE, BLOOD BANK

## 2012-10-08 LAB — CBC WITH DIFFERENTIAL/PLATELET
MCHC: 30.4 g/dL — ABNORMAL LOW (ref 32.0–36.0)
RBC: 3.27 10*6/uL — ABNORMAL LOW (ref 4.20–5.82)

## 2012-10-08 MED ORDER — DIPHENHYDRAMINE HCL 25 MG PO CAPS
25.0000 mg | ORAL_CAPSULE | Freq: Once | ORAL | Status: AC
  Administered 2012-10-08: 25 mg via ORAL

## 2012-10-08 MED ORDER — ACETAMINOPHEN 325 MG PO TABS
650.0000 mg | ORAL_TABLET | Freq: Once | ORAL | Status: AC
  Administered 2012-10-08: 650 mg via ORAL

## 2012-10-08 NOTE — Patient Instructions (Signed)
Return to clinic as scheduled for evaluation of upper and lower complete dentures. Call dental clinic if problems arise before then. Keep dentures out if sore spots arise. Use salt water rinses as needed to aid healing. Dr. Kristin Bruins

## 2012-10-08 NOTE — Progress Notes (Signed)
10/08/2012  Patient:            Shane Woodard Date of Birth:  06-11-1960 MRN:                [id number]  BP 114/68  Pulse 93  Temp(Src) 98.7 F (37.1 C) (Oral)  [name] presents for evaluation and adjustement of recently inserted upper and lower complete denture relines.  SUBJECTIVE: Patient is not complaining of any denture irritation.  OBJECTIVE: No sign of denture ulcerations or erythema.  Procedure: Pressure indicating paste applied to dentures. Adjustments made as needed. [place]. 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 Patient accepts results. Use salt water rinses as needed to aid healing. Patient to keep dentures out if sore spots develop. Return to clinic as scheduled for denture adjustment.  Call if problems arise before then. Patient dismissed in stable condition.   Charlynne Pander, DDS

## 2012-10-08 NOTE — Patient Instructions (Addendum)
Blood Transfusion  A blood transfusion replaces your blood or some of its parts. Blood is replaced when you have lost blood because of surgery, an accident, or for severe blood conditions like anemia. You can donate blood to be used on yourself if you have a planned surgery. If you lose blood during that surgery, your own blood can be given back to you. Any blood given to you is checked to make sure it matches your blood type. Your temperature, blood pressure, and heart rate (vital signs) will be checked often.  GET HELP RIGHT AWAY IF:   You feel sick to your stomach (nauseous) or throw up (vomit).  You have watery poop (diarrhea).  You have shortness of breath or trouble breathing.  You have blood in your pee (urine) or have dark colored pee.  You have chest pain or tightness.  Your eyes or skin turn yellow (jaundice).  You have a temperature by mouth above 102 F (38.9 C), not controlled by medicine.  You start to shake and have chills.  You develop a a red rash (hives) or feel itchy.  You develop lightheadedness or feel confused.  You develop back, joint, or muscle pain.  You do not feel hungry (lost appetite).  You feel tired, restless, or nervous.  You develop belly (abdominal) cramps. Document Released: 05/11/2008 Document Revised: 05/07/2011 Document Reviewed: 05/11/2008 ExitCare Patient Information 2014 [employer].  

## 2012-10-09 LAB — TYPE AND SCREEN
Antibody Screen: NEGATIVE
Unit division: 0

## 2012-10-09 LAB — PREPARE PLATELET PHERESIS

## 2012-10-15 ENCOUNTER — Other Ambulatory Visit: Payer: Self-pay | Admitting: *Deleted

## 2012-10-15 ENCOUNTER — Ambulatory Visit (HOSPITAL_BASED_OUTPATIENT_CLINIC_OR_DEPARTMENT_OTHER): Payer: Medicaid Other

## 2012-10-15 ENCOUNTER — Other Ambulatory Visit (HOSPITAL_BASED_OUTPATIENT_CLINIC_OR_DEPARTMENT_OTHER): Payer: Medicaid Other | Admitting: Lab

## 2012-10-15 VITALS — BP 107/62 | HR 87 | Temp 97.2°F | Resp 20

## 2012-10-15 DIAGNOSIS — C9000 Multiple myeloma not having achieved remission: Secondary | ICD-10-CM

## 2012-10-15 LAB — MANUAL DIFFERENTIAL
ALC: 2.8 10*3/uL (ref 0.9–3.3)
ANC (CHCC manual diff): 2.3 10*3/uL (ref 1.5–6.5)
Blasts: 0 % (ref 0–0)
LYMPH: 16 % (ref 14–49)
Metamyelocytes: 3 % — ABNORMAL HIGH (ref 0–0)
Myelocytes: 2 % — ABNORMAL HIGH (ref 0–0)
PLT EST: DECREASED
Variant Lymph: 0 % (ref 0–0)

## 2012-10-15 LAB — CBC WITH DIFFERENTIAL/PLATELET
HCT: 28 % — ABNORMAL LOW (ref 38.4–49.9)
HGB: 9 g/dL — ABNORMAL LOW (ref 13.0–17.1)
MCH: 26.2 pg — ABNORMAL LOW (ref 27.2–33.4)
MCHC: 32.1 g/dL (ref 32.0–36.0)
MCV: 81.6 fL (ref 79.3–98.0)
Platelets: 18 10*3/uL — ABNORMAL LOW (ref 140–400)

## 2012-10-15 MED ORDER — OXYCODONE HCL 5 MG PO TABS: ORAL_TABLET | ORAL | Status: DC

## 2012-10-15 MED ORDER — SODIUM CHLORIDE 0.9 % IV SOLN
250.0000 mL | Freq: Once | INTRAVENOUS | Status: AC
  Administered 2012-10-15: 250 mL via INTRAVENOUS

## 2012-10-15 NOTE — Patient Instructions (Addendum)
Platelet Transfusion Information This is information about transfusions of platelets. Platelets are tiny cells made by the bone marrow and found in the blood. When a blood vessel is damaged platelets rush to the damaged area to help form a clot. This begins the healing process. When platelets get very low your blood may have trouble clotting. This may be from:  Illness.  Blood disorder.  Chemotherapy to treat cancer. Often lower platelet counts do not usually cause problems.  Platelets usually last for 7 to 10 days. If they are not used not used in an injury, they are broken down by the liver or spleen. Symptoms of low platelet count include:  Nosebleeds.  Bleeding gums.  Heavy periods.  Bruising and tiny blood spots in the skin.  Pin point spots of bleeding are called (petechiae).  Larger bruises (purpura).  Bleeding can be more serious if it happens in the brain or bowel. Platelet transfusions are often used to keep the platelet count at an acceptable level. Serious bleeding due to low platelets is uncommon. RISKS AND COMPLICATIONS Severe side effects from platelet transfusions are uncommon. Minor reactions may include:  Itching.  Rashes.  High temperature and shivering. Medications are available to stop transfusion reactions. Let your caregivers know if you develop any of the above problems.  If you are having platelet transfusions frequently they may get less effective. This is called becoming refractory to platelets. It is uncommon. This can happen from non-immune causes and immune causes. Non-immune causes include:  High temperatures.  Some medications.  An enlarged spleen. Immune causes happen when your body discovers the platelets are not your own and begin making antibodies against them. The antibodies kill the platelets quickly. Even with platelet transfusions you may still notice problems with bleeding or bruising. Let your caregivers know about this. Other things  can be done to help if this happens.  BEFORE THE PROCEDURE   Your doctors will check your platelet count regularly.  If the platelet count is too low it may be necessary to have a platelet transfusion.  This is more important before certain procedures with a risk of bleeding such as a spinal tap.  Platelet transfusion reduces the risk of bleeding during or after the procedure.  Except in emergencies, giving a transfusion requires a written consent. Before blood is taken from a donor, a complete history is taken to make sure the person has no history of previous diseases, nor engages in risky social behavior. Examples of this are intravenous drug use or sexual activity with multiple partners. This could lead to infected blood or blood products being used. This history is done even in spite of the extensive testing to make sure the blood is safe. All blood products transfused are tested to make sure it is a match for the person getting the blood. It is also checked for infections. Blood is the safest it has ever been. The risk of getting an infection is very low. PROCEDURE  The platelets are stored in small plastic bags which are kept at a low temperature.  Each bag is called a unit and sometimes two units are given. They are given through an intravenous line by drip infusion over about one half hour.  Usually blood is collected from multiple people to get enough to transfuse.  Sometimes, the platelets are collected from a single person. This is done using a special machine that separates the platelets from the blood. The machine is called an apheresis machine. Platelets collected   in this way are called apheresed platelets. Apheresed platelets reduce the risk of becoming sensitive to the platelets. This lowers the chances of having a transfusion reaction.  As it only takes a short time to give the platelets, this treatment can be given in an outpatients department. Platelets can also be given  before or after other treatments. SEEK IMMEDIATE MEDICAL CARE IF: Any of the following symptoms over the next 12 hours or several days:  Shaking chills.  Fever with a temperature greater than 102 F (38.9 C) develops.  Back pain or muscle pain.  People around you feel you are not acting correctly, or you are confused.  Blood in the urine or bowel movements or bleeding from any place in your body.  Shortness of breath, or difficulty breathing.  Dizziness.  Fainting.  You break out in a rash or develop hives.  You have a decrease in the amount of urine you are putting out, or the urine turns a dark color or changes to pink, red, or brown.  A severe headache or stiff neck.  Bruising more easily. Document Released: 12/10/2006 Document Revised: 05/07/2011 Document Reviewed: 12/10/2006 ExitCare Patient Information 2014 [employer].  

## 2012-10-15 NOTE — Addendum Note (Signed)
Addended by: Wandalee Ferdinand on: 10/15/2012 01:28 PM   Modules accepted: Orders

## 2012-10-16 LAB — PREPARE PLATELET PHERESIS

## 2012-10-22 ENCOUNTER — Other Ambulatory Visit (HOSPITAL_BASED_OUTPATIENT_CLINIC_OR_DEPARTMENT_OTHER): Payer: Medicaid Other | Admitting: Lab

## 2012-10-22 ENCOUNTER — Ambulatory Visit (HOSPITAL_BASED_OUTPATIENT_CLINIC_OR_DEPARTMENT_OTHER): Payer: Medicaid Other

## 2012-10-22 ENCOUNTER — Other Ambulatory Visit: Payer: Self-pay | Admitting: *Deleted

## 2012-10-22 ENCOUNTER — Ambulatory Visit (HOSPITAL_BASED_OUTPATIENT_CLINIC_OR_DEPARTMENT_OTHER): Payer: Medicaid Other | Admitting: Lab

## 2012-10-22 VITALS — BP 91/63 | HR 93 | Temp 97.3°F | Resp 18

## 2012-10-22 DIAGNOSIS — C9000 Multiple myeloma not having achieved remission: Secondary | ICD-10-CM

## 2012-10-22 LAB — MANUAL DIFFERENTIAL
ALC: 2.8 10*3/uL (ref 0.9–3.3)
ANC (CHCC manual diff): 3.3 10*3/uL (ref 1.5–6.5)
Blasts: 0 % (ref 0–0)
Metamyelocytes: 2 % — ABNORMAL HIGH (ref 0–0)
Myelocytes: 1 % — ABNORMAL HIGH (ref 0–0)
PLT EST: DECREASED
PROMYELO: 0 % (ref 0–0)
SEG: 5 % — ABNORMAL LOW (ref 38–77)
Variant Lymph: 0 % (ref 0–0)

## 2012-10-22 LAB — COMPREHENSIVE METABOLIC PANEL (CC13)
ALT: 9 U/L (ref 0–55)
AST: 9 U/L (ref 5–34)
Albumin: 3.7 g/dL (ref 3.5–5.0)
CO2: 22 mEq/L (ref 22–29)
Calcium: 9.1 mg/dL (ref 8.4–10.4)
Chloride: 107 mEq/L (ref 98–109)
Potassium: 4 mEq/L (ref 3.5–5.1)

## 2012-10-22 LAB — CBC WITH DIFFERENTIAL/PLATELET
MCHC: 31 g/dL — ABNORMAL LOW (ref 32.0–36.0)
RBC: 3.46 10*6/uL — ABNORMAL LOW (ref 4.20–5.82)
RDW: 22.5 % — ABNORMAL HIGH (ref 11.0–14.6)
WBC: 25.1 10*3/uL — ABNORMAL HIGH (ref 4.0–10.3)

## 2012-10-22 MED ORDER — ZOLEDRONIC ACID 4 MG/100ML IV SOLN
4.0000 mg | Freq: Once | INTRAVENOUS | Status: AC
  Administered 2012-10-22: 4 mg via INTRAVENOUS
  Filled 2012-10-22: qty 100

## 2012-10-22 MED ORDER — HEPARIN SOD (PORK) LOCK FLUSH 100 UNIT/ML IV SOLN
250.0000 [IU] | Freq: Once | INTRAVENOUS | Status: DC | PRN
  Filled 2012-10-22: qty 5

## 2012-10-22 MED ORDER — SODIUM CHLORIDE 0.9 % IV SOLN
Freq: Once | INTRAVENOUS | Status: AC
  Administered 2012-10-22: 11:00:00 via INTRAVENOUS

## 2012-10-22 NOTE — Patient Instructions (Signed)
Zoledronic Acid injection (Hypercalcemia, Oncology) What is this medicine? ZOLEDRONIC ACID (ZOE le dron ik AS id) lowers the amount of calcium loss from bone. It is used to treat too much calcium in your blood from cancer. It is also used to prevent complications of cancer that has spread to the bone. This medicine may be used for other purposes; ask your health care provider or pharmacist if you have questions. What should I tell my health care provider before I take this medicine? They need to know if you have any of these conditions: -aspirin-sensitive asthma -dental disease -kidney disease -an unusual or allergic reaction to zoledronic acid, other medicines, foods, dyes, or preservatives -pregnant or trying to get pregnant -breast-feeding How should I use this medicine? This medicine is for infusion into a vein. It is given by a health care professional in a hospital or clinic setting. Talk to your pediatrician regarding the use of this medicine in children. Special care may be needed. Overdosage: If you think you have taken too much of this medicine contact a poison control center or emergency room at once. NOTE: This medicine is only for you. Do not share this medicine with others. What if I miss a dose? It is important not to miss your dose. Call your doctor or health care professional if you are unable to keep an appointment. What may interact with this medicine? -certain antibiotics given by injection -NSAIDs, medicines for pain and inflammation, like ibuprofen or naproxen -some diuretics like bumetanide, furosemide -teriparatide -thalidomide This list may not describe all possible interactions. Give your health care provider a list of all the medicines, herbs, non-prescription drugs, or dietary supplements you use. Also tell them if you smoke, drink alcohol, or use illegal drugs. Some items may interact with your medicine. What should I watch for while using this medicine? Visit  your doctor or health care professional for regular checkups. It may be some time before you see the benefit from this medicine. Do not stop taking your medicine unless your doctor tells you to. Your doctor may order blood tests or other tests to see how you are doing. Women should inform their doctor if they wish to become pregnant or think they might be pregnant. There is a potential for serious side effects to an unborn child. Talk to your health care professional or pharmacist for more information. You should make sure that you get enough calcium and vitamin D while you are taking this medicine. Discuss the foods you eat and the vitamins you take with your health care professional. Some people who take this medicine have severe bone, joint, and/or muscle pain. This medicine may also increase your risk for a broken thigh bone. Tell your doctor right away if you have pain in your upper leg or groin. Tell your doctor if you have any pain that does not go away or that gets worse. What side effects may I notice from receiving this medicine? Side effects that you should report to your doctor or health care professional as soon as possible: -allergic reactions like skin rash, itching or hives, swelling of the face, lips, or tongue -anxiety, confusion, or depression -breathing problems -changes in vision -feeling faint or lightheaded, falls -jaw burning, cramping, pain -muscle cramps, stiffness, or weakness -trouble passing urine or change in the amount of urine Side effects that usually do not require medical attention (report to your doctor or health care professional if they continue or are bothersome): -bone, joint, or muscle pain -  fever -hair loss -irritation at site where injected -loss of appetite -nausea, vomiting -stomach upset -tired This list may not describe all possible side effects. Call your doctor for medical advice about side effects. You may report side effects to FDA at  1-800-FDA-1088. Where should I keep my medicine? This drug is given in a hospital or clinic and will not be stored at home. NOTE: This sheet is a summary. It may not cover all possible information. If you have questions about this medicine, talk to your doctor, pharmacist, or health care provider.  2012, [employer]. (08/11/2010 9:06:58 AM) 

## 2012-10-24 ENCOUNTER — Telehealth: Payer: Self-pay | Admitting: Hematology and Oncology

## 2012-10-24 NOTE — Telephone Encounter (Signed)
lmonvm for pt via [place] interpreter for appt 9/2 @ 11:30am and pt to get new schedule when he comes in. S/w interpreter [phone] and called cell due to home number not working.

## 2012-10-28 ENCOUNTER — Other Ambulatory Visit: Payer: Self-pay

## 2012-11-03 ENCOUNTER — Other Ambulatory Visit: Payer: Self-pay

## 2012-11-04 ENCOUNTER — Other Ambulatory Visit: Payer: Self-pay | Admitting: Oncology

## 2012-11-04 ENCOUNTER — Ambulatory Visit (HOSPITAL_BASED_OUTPATIENT_CLINIC_OR_DEPARTMENT_OTHER): Payer: Medicaid Other

## 2012-11-04 ENCOUNTER — Ambulatory Visit (HOSPITAL_BASED_OUTPATIENT_CLINIC_OR_DEPARTMENT_OTHER): Payer: Medicaid Other | Admitting: Lab

## 2012-11-04 ENCOUNTER — Ambulatory Visit (HOSPITAL_COMMUNITY)
Admission: RE | Admit: 2012-11-04 | Discharge: 2012-11-04 | Disposition: A | Discharge status: Home / Self Care | Payer: Medicaid Other | Source: Ambulatory Visit | Attending: Hematology and Oncology | Admitting: Hematology and Oncology

## 2012-11-04 VITALS — BP 105/68 | HR 78 | Temp 97.0°F | Resp 18

## 2012-11-04 DIAGNOSIS — C9200 Acute myeloblastic leukemia, not having achieved remission: Secondary | ICD-10-CM

## 2012-11-04 DIAGNOSIS — C9000 Multiple myeloma not having achieved remission: Secondary | ICD-10-CM

## 2012-11-04 DIAGNOSIS — D649 Anemia, unspecified: Secondary | ICD-10-CM

## 2012-11-04 LAB — CBC WITH DIFFERENTIAL/PLATELET
MCHC: 31.4 g/dL — ABNORMAL LOW (ref 32.0–36.0)
Platelets: 10 10*3/uL — CL (ref 140–400)
RBC: 2.67 10*6/uL — ABNORMAL LOW (ref 4.20–5.82)
RDW: 25.9 % — ABNORMAL HIGH (ref 11.0–14.6)

## 2012-11-04 LAB — MANUAL DIFFERENTIAL
ANC (CHCC manual diff): 0.8 10*3/uL — ABNORMAL LOW (ref 1.5–6.5)
Basophil: 0 % (ref 0–2)
LYMPH: 33 % (ref 14–49)
MONO: 2 % (ref 0–14)
Myelocytes: 0 % (ref 0–0)
Other Cell: 0 % (ref 0–0)
PLT EST: DECREASED
PROMYELO: 0 % (ref 0–0)
Variant Lymph: 0 % (ref 0–0)
nRBC: 4 % — ABNORMAL HIGH (ref 0–0)

## 2012-11-04 LAB — PREPARE RBC (CROSSMATCH)

## 2012-11-04 MED ORDER — DIPHENHYDRAMINE HCL 25 MG PO CAPS
25.0000 mg | ORAL_CAPSULE | Freq: Once | ORAL | Status: AC
  Administered 2012-11-04: 25 mg via ORAL

## 2012-11-04 MED ORDER — ACETAMINOPHEN 325 MG PO TABS
650.0000 mg | ORAL_TABLET | Freq: Once | ORAL | Status: AC
  Administered 2012-11-04: 650 mg via ORAL

## 2012-11-04 MED ORDER — ACETAMINOPHEN 325 MG PO TABS
ORAL_TABLET | ORAL | Status: AC
  Filled 2012-11-04: qty 2

## 2012-11-04 MED ORDER — SODIUM CHLORIDE 0.9 % IV SOLN: 250.0000 mL | Freq: Once | INTRAVENOUS | Status: AC

## 2012-11-04 NOTE — Patient Instructions (Signed)
Blood Transfusion Information WHAT IS A BLOOD TRANSFUSION? A transfusion is the replacement of blood or some of its parts. Blood is made up of multiple cells which provide different functions.  Red blood cells carry oxygen and are used for blood loss replacement.  White blood cells fight against infection.  Platelets control bleeding.  Plasma helps clot blood.  Other blood products are available for specialized needs, such as hemophilia or other clotting disorders. BEFORE THE TRANSFUSION  Who gives blood for transfusions?   You may be able to donate blood to be used at a later date on yourself (autologous donation).  Relatives can be asked to donate blood. This is generally not any safer than if you have received blood from a stranger. The same precautions are taken to ensure safety when a relative's blood is donated.  Healthy volunteers who are fully evaluated to make sure their blood is safe. This is blood bank blood. Transfusion therapy is the safest it has ever been in the practice of medicine. Before blood is taken from a donor, a complete history is taken to make sure that person has no history of diseases nor engages in risky social behavior (examples are intravenous drug use or sexual activity with multiple partners). The donor's travel history is screened to minimize risk of transmitting infections, such as malaria. The donated blood is tested for signs of infectious diseases, such as HIV and hepatitis. The blood is then tested to be sure it is compatible with you in order to minimize the chance of a transfusion reaction. If you or a relative donates blood, this is often done in anticipation of surgery and is not appropriate for emergency situations. It takes many days to process the donated blood. RISKS AND COMPLICATIONS Although transfusion therapy is very safe and saves many lives, the main dangers of transfusion include:   Getting an infectious disease.  Developing a  transfusion reaction. This is an allergic reaction to something in the blood you were given. Every precaution is taken to prevent this. The decision to have a blood transfusion has been considered carefully by your caregiver before blood is given. Blood is not given unless the benefits outweigh the risks. AFTER THE TRANSFUSION  Right after receiving a blood transfusion, you will usually feel much better and more energetic. This is especially true if your red blood cells have gotten low (anemic). The transfusion raises the level of the red blood cells which carry oxygen, and this usually causes an energy increase.  The nurse administering the transfusion will monitor you carefully for complications. HOME CARE INSTRUCTIONS  No special instructions are needed after a transfusion. You may find your energy is better. Speak with your caregiver about any limitations on activity for underlying diseases you may have. SEEK MEDICAL CARE IF:   Your condition is not improving after your transfusion.  You develop redness or irritation at the intravenous (IV) site. SEEK IMMEDIATE MEDICAL CARE IF:  Any of the following symptoms occur over the next 12 hours:  Shaking chills.  You have a temperature by mouth above 102 F (38.9 C), not controlled by medicine.  Chest, back, or muscle pain.  People around you feel you are not acting correctly or are confused.  Shortness of breath or difficulty breathing.  Dizziness and fainting.  You get a rash or develop hives.  You have a decrease in urine output.  Your urine turns a dark color or changes to pink, red, or brown. Any of the following   symptoms occur over the next 10 days:  You have a temperature by mouth above 102 F (38.9 C), not controlled by medicine.  Shortness of breath.  Weakness after normal activity.  The white part of the eye turns yellow (jaundice).  You have a decrease in the amount of urine or are urinating less often.  Your  urine turns a dark color or changes to pink, red, or brown. Document Released: 02/10/2000 Document Revised: 05/07/2011 Document Reviewed: 09/29/2007 ExitCare Patient Information 2014 [employer].  

## 2012-11-05 ENCOUNTER — Ambulatory Visit (HOSPITAL_BASED_OUTPATIENT_CLINIC_OR_DEPARTMENT_OTHER): Payer: Medicaid Other

## 2012-11-05 ENCOUNTER — Other Ambulatory Visit: Payer: Self-pay | Admitting: *Deleted

## 2012-11-05 VITALS — BP 104/66 | HR 78 | Temp 97.8°F | Resp 18

## 2012-11-05 DIAGNOSIS — D649 Anemia, unspecified: Secondary | ICD-10-CM

## 2012-11-05 LAB — PREPARE RBC (CROSSMATCH)

## 2012-11-05 LAB — TYPE AND SCREEN
ABO/RH(D): B POS
Unit division: 0
Unit division: 0

## 2012-11-05 MED ORDER — DIPHENHYDRAMINE HCL 25 MG PO CAPS
25.0000 mg | ORAL_CAPSULE | Freq: Once | ORAL | Status: AC
  Administered 2012-11-05: 25 mg via ORAL

## 2012-11-05 MED ORDER — ACETAMINOPHEN 325 MG PO TABS
650.0000 mg | ORAL_TABLET | Freq: Once | ORAL | Status: AC
  Administered 2012-11-05: 650 mg via ORAL

## 2012-11-05 MED ORDER — ACETAMINOPHEN 325 MG PO TABS
ORAL_TABLET | ORAL | Status: AC
  Administered 2012-11-05: 650 mg via ORAL
  Filled 2012-11-05: qty 2

## 2012-11-05 MED ORDER — DIPHENHYDRAMINE HCL 25 MG PO CAPS
ORAL_CAPSULE | ORAL | Status: AC
  Administered 2012-11-05: 25 mg via ORAL
  Filled 2012-11-05: qty 1

## 2012-11-05 NOTE — Progress Notes (Signed)
Patient states he would like a port. Dr. Felecia Shelling nurse notified.

## 2012-11-05 NOTE — Patient Instructions (Signed)
Blood Transfusion Information WHAT IS A BLOOD TRANSFUSION? A transfusion is the replacement of blood or some of its parts. Blood is made up of multiple cells which provide different functions.  Red blood cells carry oxygen and are used for blood loss replacement.  White blood cells fight against infection.  Platelets control bleeding.  Plasma helps clot blood.  Other blood products are available for specialized needs, such as hemophilia or other clotting disorders. BEFORE THE TRANSFUSION  Who gives blood for transfusions?   You may be able to donate blood to be used at a later date on yourself (autologous donation).  Relatives can be asked to donate blood. This is generally not any safer than if you have received blood from a stranger. The same precautions are taken to ensure safety when a relative's blood is donated.  Healthy volunteers who are fully evaluated to make sure their blood is safe. This is blood bank blood. Transfusion therapy is the safest it has ever been in the practice of medicine. Before blood is taken from a donor, a complete history is taken to make sure that person has no history of diseases nor engages in risky social behavior (examples are intravenous drug use or sexual activity with multiple partners). The donor's travel history is screened to minimize risk of transmitting infections, such as malaria. The donated blood is tested for signs of infectious diseases, such as HIV and hepatitis. The blood is then tested to be sure it is compatible with you in order to minimize the chance of a transfusion reaction. If you or a relative donates blood, this is often done in anticipation of surgery and is not appropriate for emergency situations. It takes many days to process the donated blood. RISKS AND COMPLICATIONS Although transfusion therapy is very safe and saves many lives, the main dangers of transfusion include:   Getting an infectious disease.  Developing a  transfusion reaction. This is an allergic reaction to something in the blood you were given. Every precaution is taken to prevent this. The decision to have a blood transfusion has been considered carefully by your caregiver before blood is given. Blood is not given unless the benefits outweigh the risks. AFTER THE TRANSFUSION  Right after receiving a blood transfusion, you will usually feel much better and more energetic. This is especially true if your red blood cells have gotten low (anemic). The transfusion raises the level of the red blood cells which carry oxygen, and this usually causes an energy increase.  The nurse administering the transfusion will monitor you carefully for complications. HOME CARE INSTRUCTIONS  No special instructions are needed after a transfusion. You may find your energy is better. Speak with your caregiver about any limitations on activity for underlying diseases you may have. SEEK MEDICAL CARE IF:   Your condition is not improving after your transfusion.  You develop redness or irritation at the intravenous (IV) site. SEEK IMMEDIATE MEDICAL CARE IF:  Any of the following symptoms occur over the next 12 hours:  Shaking chills.  You have a temperature by mouth above 102 F (38.9 C), not controlled by medicine.  Chest, back, or muscle pain.  People around you feel you are not acting correctly or are confused.  Shortness of breath or difficulty breathing.  Dizziness and fainting.  You get a rash or develop hives.  You have a decrease in urine output.  Your urine turns a dark color or changes to pink, red, or brown. Any of the following   symptoms occur over the next 10 days:  You have a temperature by mouth above 102 F (38.9 C), not controlled by medicine.  Shortness of breath.  Weakness after normal activity.  The white part of the eye turns yellow (jaundice).  You have a decrease in the amount of urine or are urinating less often.  Your  urine turns a dark color or changes to pink, red, or brown. Document Released: 02/10/2000 Document Revised: 05/07/2011 Document Reviewed: 09/29/2007 ExitCare Patient Information 2014 [employer].  

## 2012-11-06 ENCOUNTER — Other Ambulatory Visit: Payer: Self-pay | Admitting: *Deleted

## 2012-11-06 DIAGNOSIS — C9000 Multiple myeloma not having achieved remission: Secondary | ICD-10-CM

## 2012-11-06 LAB — TYPE AND SCREEN: ABO/RH(D): B POS

## 2012-11-06 MED ORDER — ESCITALOPRAM OXALATE 10 MG PO TABS: ORAL_TABLET | ORAL | Status: AC

## 2012-11-10 ENCOUNTER — Other Ambulatory Visit: Payer: Self-pay | Admitting: Oncology

## 2012-11-10 ENCOUNTER — Other Ambulatory Visit (HOSPITAL_BASED_OUTPATIENT_CLINIC_OR_DEPARTMENT_OTHER): Payer: Medicaid Other

## 2012-11-10 ENCOUNTER — Ambulatory Visit (HOSPITAL_BASED_OUTPATIENT_CLINIC_OR_DEPARTMENT_OTHER): Payer: Medicaid Other

## 2012-11-10 ENCOUNTER — Telehealth: Payer: Self-pay | Admitting: *Deleted

## 2012-11-10 VITALS — BP 111/70 | HR 79 | Temp 97.8°F | Resp 18

## 2012-11-10 DIAGNOSIS — C9000 Multiple myeloma not having achieved remission: Secondary | ICD-10-CM

## 2012-11-10 DIAGNOSIS — D649 Anemia, unspecified: Secondary | ICD-10-CM

## 2012-11-10 LAB — MANUAL DIFFERENTIAL
ANC (CHCC manual diff): 1.5 10*3/uL (ref 1.5–6.5)
Band Neutrophils: 6 % (ref 0–10)
Blasts: 0 % (ref 0–0)
Other Cell: 0 % (ref 0–0)
PLT EST: DECREASED
PROMYELO: 0 % (ref 0–0)
SEG: 5 % — ABNORMAL LOW (ref 38–77)
nRBC: 0 % (ref 0–0)

## 2012-11-10 LAB — CBC WITH DIFFERENTIAL/PLATELET
HCT: 28.3 % — ABNORMAL LOW (ref 38.4–49.9)
HGB: 8.9 g/dL — ABNORMAL LOW (ref 13.0–17.1)
MCV: 81.8 fL (ref 79.3–98.0)
RDW: 20.8 % — ABNORMAL HIGH (ref 11.0–14.6)
WBC: 9.4 10*3/uL (ref 4.0–10.3)

## 2012-11-10 MED ORDER — ACETAMINOPHEN 325 MG PO TABS
ORAL_TABLET | ORAL | Status: AC
  Filled 2012-11-10: qty 2

## 2012-11-10 MED ORDER — DIPHENHYDRAMINE HCL 25 MG PO CAPS
ORAL_CAPSULE | ORAL | Status: AC
  Filled 2012-11-10: qty 1

## 2012-11-10 MED ORDER — DIPHENHYDRAMINE HCL 25 MG PO CAPS
25.0000 mg | ORAL_CAPSULE | Freq: Once | ORAL | Status: AC
  Administered 2012-11-10: 25 mg via ORAL

## 2012-11-10 MED ORDER — ACETAMINOPHEN 325 MG PO TABS
650.0000 mg | ORAL_TABLET | Freq: Once | ORAL | Status: AC
  Administered 2012-11-10: 650 mg via ORAL

## 2012-11-10 NOTE — Telephone Encounter (Signed)
Patient's platelets are 2,000. Patient left the cancer center. Called him at home and let him know he needs to come back to the cancer center for platelet transfusion. platelets are coming from [facility]. Infusion charge nurse will call patient when platelets get here.

## 2012-11-10 NOTE — Patient Instructions (Signed)
Platelet Transfusion Information This is information about transfusions of platelets. Platelets are tiny cells made by the bone marrow and found in the blood. When a blood vessel is damaged platelets rush to the damaged area to help form a clot. This begins the healing process. When platelets get very low your blood may have trouble clotting. This may be from:  Illness.  Blood disorder.  Chemotherapy to treat cancer. Often lower platelet counts do not usually cause problems.  Platelets usually last for 7 to 10 days. If they are not used not used in an injury, they are broken down by the liver or spleen. Symptoms of low platelet count include:  Nosebleeds.  Bleeding gums.  Heavy periods.  Bruising and tiny blood spots in the skin.  Pin point spots of bleeding are called (petechiae).  Larger bruises (purpura).  Bleeding can be more serious if it happens in the brain or bowel. Platelet transfusions are often used to keep the platelet count at an acceptable level. Serious bleeding due to low platelets is uncommon. RISKS AND COMPLICATIONS Severe side effects from platelet transfusions are uncommon. Minor reactions may include:  Itching.  Rashes.  High temperature and shivering. Medications are available to stop transfusion reactions. Let your caregivers know if you develop any of the above problems.  If you are having platelet transfusions frequently they may get less effective. This is called becoming refractory to platelets. It is uncommon. This can happen from non-immune causes and immune causes. Non-immune causes include:  High temperatures.  Some medications.  An enlarged spleen. Immune causes happen when your body discovers the platelets are not your own and begin making antibodies against them. The antibodies kill the platelets quickly. Even with platelet transfusions you may still notice problems with bleeding or bruising. Let your caregivers know about this. Other things  can be done to help if this happens.  BEFORE THE PROCEDURE   Your doctors will check your platelet count regularly.  If the platelet count is too low it may be necessary to have a platelet transfusion.  This is more important before certain procedures with a risk of bleeding such as a spinal tap.  Platelet transfusion reduces the risk of bleeding during or after the procedure.  Except in emergencies, giving a transfusion requires a written consent. Before blood is taken from a donor, a complete history is taken to make sure the person has no history of previous diseases, nor engages in risky social behavior. Examples of this are intravenous drug use or sexual activity with multiple partners. This could lead to infected blood or blood products being used. This history is done even in spite of the extensive testing to make sure the blood is safe. All blood products transfused are tested to make sure it is a match for the person getting the blood. It is also checked for infections. Blood is the safest it has ever been. The risk of getting an infection is very low. PROCEDURE  The platelets are stored in small plastic bags which are kept at a low temperature.  Each bag is called a unit and sometimes two units are given. They are given through an intravenous line by drip infusion over about one half hour.  Usually blood is collected from multiple people to get enough to transfuse.  Sometimes, the platelets are collected from a single person. This is done using a special machine that separates the platelets from the blood. The machine is called an apheresis machine. Platelets collected   in this way are called apheresed platelets. Apheresed platelets reduce the risk of becoming sensitive to the platelets. This lowers the chances of having a transfusion reaction.  As it only takes a short time to give the platelets, this treatment can be given in an outpatients department. Platelets can also be given  before or after other treatments. SEEK IMMEDIATE MEDICAL CARE IF: Any of the following symptoms over the next 12 hours or several days:  Shaking chills.  Fever with a temperature greater than 102 F (38.9 C) develops.  Back pain or muscle pain.  People around you feel you are not acting correctly, or you are confused.  Blood in the urine or bowel movements or bleeding from any place in your body.  Shortness of breath, or difficulty breathing.  Dizziness.  Fainting.  You break out in a rash or develop hives.  You have a decrease in the amount of urine you are putting out, or the urine turns a dark color or changes to pink, red, or brown.  A severe headache or stiff neck.  Bruising more easily. Document Released: 12/10/2006 Document Revised: 05/07/2011 Document Reviewed: 12/10/2006 ExitCare Patient Information 2014 [employer].  

## 2012-11-11 LAB — PREPARE PLATELET PHERESIS

## 2012-11-14 ENCOUNTER — Telehealth: Payer: Self-pay | Admitting: *Deleted

## 2012-11-14 NOTE — Telephone Encounter (Signed)
sw pt gv all her appt times for 11/19/12 pt is aware to be here @ 8:00 for labs, ov@ 8:30,and tx to follow. i emailed MW to see if she could adjust the tx time.Marland Kitchentd

## 2012-11-17 ENCOUNTER — Telehealth: Payer: Self-pay | Admitting: *Deleted

## 2012-11-17 NOTE — Telephone Encounter (Signed)
Per staff message I have adjusted 9/24

## 2012-11-19 ENCOUNTER — Other Ambulatory Visit (HOSPITAL_BASED_OUTPATIENT_CLINIC_OR_DEPARTMENT_OTHER): Payer: Medicaid Other | Admitting: Lab

## 2012-11-19 ENCOUNTER — Other Ambulatory Visit: Payer: Self-pay | Admitting: Emergency Medicine

## 2012-11-19 ENCOUNTER — Ambulatory Visit (HOSPITAL_BASED_OUTPATIENT_CLINIC_OR_DEPARTMENT_OTHER): Payer: Medicaid Other

## 2012-11-19 ENCOUNTER — Ambulatory Visit (HOSPITAL_BASED_OUTPATIENT_CLINIC_OR_DEPARTMENT_OTHER): Payer: Medicaid Other | Admitting: Hematology and Oncology

## 2012-11-19 ENCOUNTER — Other Ambulatory Visit: Payer: Self-pay | Admitting: Lab

## 2012-11-19 ENCOUNTER — Encounter: Payer: Self-pay | Admitting: Hematology and Oncology

## 2012-11-19 VITALS — BP 108/71 | HR 86 | Temp 97.4°F | Resp 16

## 2012-11-19 VITALS — BP 120/63 | HR 90 | Temp 98.0°F | Resp 18 | Ht 65.0 in | Wt 155.2 lb

## 2012-11-19 DIAGNOSIS — D649 Anemia, unspecified: Secondary | ICD-10-CM

## 2012-11-19 DIAGNOSIS — C9000 Multiple myeloma not having achieved remission: Secondary | ICD-10-CM

## 2012-11-19 DIAGNOSIS — C9200 Acute myeloblastic leukemia, not having achieved remission: Secondary | ICD-10-CM

## 2012-11-19 DIAGNOSIS — M549 Dorsalgia, unspecified: Secondary | ICD-10-CM | POA: Insufficient documentation

## 2012-11-19 HISTORY — DX: Dorsalgia, unspecified: M54.9

## 2012-11-19 LAB — COMPREHENSIVE METABOLIC PANEL (CC13)
ALT: 8 U/L (ref 0–55)
Alkaline Phosphatase: 71 U/L (ref 40–150)
BUN: 10.7 mg/dL (ref 7.0–26.0)
CO2: 23 mEq/L (ref 22–29)
Calcium: 9.4 mg/dL (ref 8.4–10.4)
Chloride: 108 mEq/L (ref 98–109)
Creatinine: 0.8 mg/dL (ref 0.7–1.3)
Total Bilirubin: 1.05 mg/dL (ref 0.20–1.20)

## 2012-11-19 LAB — CBC WITH DIFFERENTIAL/PLATELET
BASO%: 2.9 % — ABNORMAL HIGH (ref 0.0–2.0)
EOS%: 60.8 % — ABNORMAL HIGH (ref 0.0–7.0)
Eosinophils Absolute: 5.6 10*3/uL — ABNORMAL HIGH (ref 0.0–0.5)
HCT: 28.5 % — ABNORMAL LOW (ref 38.4–49.9)
LYMPH%: 21 % (ref 14.0–49.0)
MCHC: 30 g/dL — ABNORMAL LOW (ref 32.0–36.0)
MCV: 81.4 fL (ref 79.3–98.0)
MONO%: 2.6 % (ref 0.0–14.0)
NEUT#: 1.2 10*3/uL — ABNORMAL LOW (ref 1.5–6.5)
NEUT%: 12.7 % — ABNORMAL LOW (ref 39.0–75.0)
Platelets: 10 10*3/uL — CL (ref 140–400)
RBC: 3.5 10*6/uL — ABNORMAL LOW (ref 4.20–5.82)
RDW: 21.4 % — ABNORMAL HIGH (ref 11.0–14.6)

## 2012-11-19 LAB — HOLD TUBE, BLOOD BANK

## 2012-11-19 LAB — TECHNOLOGIST REVIEW

## 2012-11-19 MED ORDER — SODIUM CHLORIDE 0.9 % IV SOLN
250.0000 mL | Freq: Once | INTRAVENOUS | Status: AC
  Administered 2012-11-19: 250 mL via INTRAVENOUS

## 2012-11-19 MED ORDER — CYCLOBENZAPRINE HCL 5 MG PO TABS: ORAL_TABLET | ORAL | Status: AC

## 2012-11-19 MED ORDER — OXYCODONE HCL 5 MG PO TABS: 10.0000 mg | ORAL_TABLET | ORAL | Status: DC | PRN

## 2012-11-19 MED ORDER — ZOLEDRONIC ACID 4 MG/100ML IV SOLN
4.0000 mg | Freq: Once | INTRAVENOUS | Status: AC
  Administered 2012-11-19: 4 mg via INTRAVENOUS
  Filled 2012-11-19: qty 100

## 2012-11-19 NOTE — Patient Instructions (Addendum)
[facility] Discharge Instructions for Patients Receiving Chemotherapy  Today you received the following chemotherapy agents ZOMETA   BELOW ARE SYMPTOMS THAT SHOULD BE REPORTED IMMEDIATELY:  *FEVER GREATER THAN 100.5 F  *CHILLS WITH OR WITHOUT FEVER  NAUSEA AND VOMITING THAT IS NOT CONTROLLED WITH YOUR NAUSEA MEDICATION  *UNUSUAL SHORTNESS OF BREATH  *UNUSUAL BRUISING OR BLEEDING  TENDERNESS IN MOUTH AND THROAT WITH OR WITHOUT PRESENCE OF ULCERS  *URINARY PROBLEMS  *BOWEL PROBLEMS  UNUSUAL RASH Items with * indicate a potential emergency and should be followed up as soon as possible.  Feel free to call the clinic you have any questions or concerns. The clinic phone number is [phone].  Platelet Transfusion Information This is information about transfusions of platelets. Platelets are tiny cells made by the bone marrow and found in the blood. When a blood vessel is damaged platelets rush to the damaged area to help form a clot. This begins the healing process. When platelets get very low your blood may have trouble clotting. This may be from:  Illness.  Blood disorder.  Chemotherapy to treat cancer. Often lower platelet counts do not usually cause problems.  Platelets usually last for 7 to 10 days. If they are not used not used in an injury, they are broken down by the liver or spleen. Symptoms of low platelet count include:  Nosebleeds.  Bleeding gums.  Heavy periods.  Bruising and tiny blood spots in the skin.  Pin point spots of bleeding are called (petechiae).  Larger bruises (purpura).  Bleeding can be more serious if it happens in the brain or bowel. Platelet transfusions are often used to keep the platelet count at an acceptable level. Serious bleeding due to low platelets is uncommon. RISKS AND COMPLICATIONS Severe side effects from platelet transfusions are uncommon. Minor reactions may include:  Itching.  Rashes.  High  temperature and shivering. Medications are available to stop transfusion reactions. Let your caregivers know if you develop any of the above problems.  If you are having platelet transfusions frequently they may get less effective. This is called becoming refractory to platelets. It is uncommon. This can happen from non-immune causes and immune causes. Non-immune causes include:  High temperatures.  Some medications.  An enlarged spleen. Immune causes happen when your body discovers the platelets are not your own and begin making antibodies against them. The antibodies kill the platelets quickly. Even with platelet transfusions you may still notice problems with bleeding or bruising. Let your caregivers know about this. Other things can be done to help if this happens.  BEFORE THE PROCEDURE   Your doctors will check your platelet count regularly.  If the platelet count is too low it may be necessary to have a platelet transfusion.  This is more important before certain procedures with a risk of bleeding such as a spinal tap.  Platelet transfusion reduces the risk of bleeding during or after the procedure.  Except in emergencies, giving a transfusion requires a written consent. Before blood is taken from a donor, a complete history is taken to make sure the person has no history of previous diseases, nor engages in risky social behavior. Examples of this are intravenous drug use or sexual activity with multiple partners. This could lead to infected blood or blood products being used. This history is done even in spite of the extensive testing to make sure the blood is safe. All blood products transfused are tested to make sure it is a  match for the person getting the blood. It is also checked for infections. Blood is the safest it has ever been. The risk of getting an infection is very low. PROCEDURE  The platelets are stored in small plastic bags which are kept at a low temperature.  Each  bag is called a unit and sometimes two units are given. They are given through an intravenous line by drip infusion over about one half hour.  Usually blood is collected from multiple people to get enough to transfuse.  Sometimes, the platelets are collected from a single person. This is done using a special machine that separates the platelets from the blood. The machine is called an apheresis machine. Platelets collected in this way are called apheresed platelets. Apheresed platelets reduce the risk of becoming sensitive to the platelets. This lowers the chances of having a transfusion reaction.  As it only takes a short time to give the platelets, this treatment can be given in an outpatients department. Platelets can also be given before or after other treatments. SEEK IMMEDIATE MEDICAL CARE IF: Any of the following symptoms over the next 12 hours or several days:  Shaking chills.  Fever with a temperature greater than 102 F (38.9 C) develops.  Back pain or muscle pain.  People around you feel you are not acting correctly, or you are confused.  Blood in the urine or bowel movements or bleeding from any place in your body.  Shortness of breath, or difficulty breathing.  Dizziness.  Fainting.  You break out in a rash or develop hives.  You have a decrease in the amount of urine you are putting out, or the urine turns a dark color or changes to pink, red, or brown.  A severe headache or stiff neck.  Bruising more easily. Document Released: 12/10/2006 Document Revised: 05/07/2011 Document Reviewed: 12/10/2006 [facility] Patient Information 2014 [place], [place].

## 2012-11-19 NOTE — Progress Notes (Signed)
Interpreter read section of AVS that enumerates when to call MD to pt.

## 2012-11-19 NOTE — Progress Notes (Signed)
[facility] OFFICE PROGRESS NOTE  Shane Housekeeper, MD [address] [place] [address]  DIAGNOSIS: IgG lambda multiple myeloma; history is obtained through an interpreter  SUMMARY OF ONCOLOGIC HISTORY: presented with anemia, lytic bone lesions. Initial M-spike was 5.1gm/dL; free serum lambda of 6.04 mg/dL; Ig G 7760 mg/dL; [id number] of 2.47. Bone marrow biopsy showed 35% plasma cell; cytogenetics per FISH was positive for t(11;14). Chemotherapy was started on 03/19/11 [name] 1.3mg /m2 SQ d1, 4,8,11 q28 day; Revlimid 25 mg PO d1-21 q28day; Dexamethasone 40mg  PO weekly (even of week off of chemo). He was also on Acyclovir 400mg  PO BID; Bactrim DS Mon/Wed/Fri; Lovenox 40mg  SQ daily. He was also on qmonth Zometa for bone protection. 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 He has been on maintenance Relimid now at 5mg  PO daily (by [facility]) and monthly Zometa (with [place]) since late 2013. Revlimid was discontinued in July of 2014 when he was found to have pancytopenia and persistent eosinophilia. 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 male returns for routine followup. His energy level is fair. His been complaining of intermittent bone pain especially his back and the upper part of his neck. He's been taking Flexeril for muscle spasm at oxycodone 5 mg 3 times a day to control his bone pain. He does not feel that his pain is adequately controlled. When he was sitting, he rated his pain 3/10 pain. His activity, he felt that his pain level is about 6 or 7/10 pain. He has mild constipation with pain medicine but controlled with laxatives. No nausea. The patient complain of easy bruising but no spontaneous bleeding such as epistaxis, hematuria, or hematochezia. Denies any recent fevers or chills. His  appetite is stable. He complain of occasional dizziness. No fainting episodes. He continued to smoke 5-6 cigarettes a day and not interested to quit at this point  I have reviewed the past medical history, past surgical history, social history and family history with the patient and they are unchanged from previous note.  ALLERGIES:  has No Known Allergies.  MEDICATIONS: has a current medication list which includes the following prescription(s): aspirin, cyclobenzaprine, escitalopram, folic acid, ondansetron, ondansetron, valacyclovir, acyclovir, docusate sodium, oxycodone, prochlorperazine, and zolendronic acid, and the following Facility-Administered Medications: sodium chloride and Zoledronic Acid (ZOMETA) 4 mg IVPB.  REVIEW OF SYSTEMS:   Constitutional: Denies fevers, chills or abnormal weight loss Eyes: Denies blurriness of vision Ears, nose, mouth, throat, and face: Denies mucositis or sore throat Respiratory: Denies cough, dyspnea or wheezes Cardiovascular: Denies palpitation, chest discomfort or lower extremity swelling Gastrointestinal:  Denies nausea, heartburn or change in bowel habits Skin: Denies abnormal skin rashes Lymphatics: Denies new lymphadenopathy or easy bruising Neurological:Denies numbness, tingling or new weaknesses Behavioral/Psych: Mood is stable, no new changes  All other systems were reviewed with the patient and are negative.  PHYSICAL EXAMINATION: ECOG PERFORMANCE STATUS: 1 - Symptomatic but completely ambulatory  Filed Vitals:   11/19/12 0835  BP: 120/63  Pulse: 90  Temp: 98 F (36.7 C)  Resp: 18    GENERAL:alert, no distress and comfortable SKIN: skin color, texture, turgor are normal, no rashes or significant lesions noted some bruises EYES: normal, Conjunctiva are pink and non-injected, sclera clear OROPHARYNX:no exudate, no erythema and lips, buccal mucosa, and tongue normal  NECK: supple, thyroid normal size, non-tender,  without  nodularity LYMPH:  no palpable lymphadenopathy in the cervical, axillary or inguinal LUNGS: clear to auscultation and percussion with normal breathing effort HEART: regular rate & rhythm and no murmurs and no lower extremity edema ABDOMEN:abdomen soft, non-tender and normal bowel sounds Musculoskeletal:no cyanosis of digits and no clubbing  NEURO: alert & oriented x 3 with fluent speech, no focal motor/sensory deficits  LABORATORY DATA:  I have reviewed the data as listed    Component Value Date/Time   NA 139 10/22/2012 1110   NA 139 10/19/2011 0950   K 4.0 10/22/2012 1110   K 3.5 10/19/2011 0950   CL 108* 07/02/2012 0910   CL 103 10/19/2011 0950   CO2 22 10/22/2012 1110   CO2 26 10/19/2011 0950   GLUCOSE 107 10/22/2012 1110   GLUCOSE 156* 07/02/2012 0910   GLUCOSE 92 10/19/2011 0950   BUN 16.5 10/22/2012 1110   BUN 6 10/19/2011 0950   CREATININE 0.9 10/22/2012 1110   CREATININE 0.61 10/19/2011 0950   CREATININE 1.03 03/13/2011 0812   CALCIUM 9.1 10/22/2012 1110   CALCIUM 9.1 10/19/2011 0950   PROT 7.7 10/22/2012 1110   PROT 5.6* 07/26/2011 1332   ALBUMIN 3.7 10/22/2012 1110   ALBUMIN 3.4* 07/26/2011 1332   AST 9 10/22/2012 1110   AST 13 07/26/2011 1332   ALT 9 10/22/2012 1110   ALT 29 07/26/2011 1332   ALKPHOS 74 10/22/2012 1110   ALKPHOS 58 07/26/2011 1332   BILITOT 1.37* 10/22/2012 1110   BILITOT 0.2* 07/26/2011 1332   GFRNONAA >90 04/10/2011 0837   GFRAA >90 04/10/2011 0837    I No results found for this basename: SPEP,  UPEP,   kappa and lambda light chains    Lab Results  Component Value Date   WBC 9.2 11/19/2012   NEUTROABS 1.2* 11/19/2012   HGB 8.6* 11/19/2012   HCT 28.5* 11/19/2012   MCV 81.4 11/19/2012   PLT 10* 11/19/2012      Chemistry      Component Value Date/Time   NA 139 10/22/2012 1110   NA 139 10/19/2011 0950   K 4.0 10/22/2012 1110   K 3.5 10/19/2011 0950   CL 108* 07/02/2012 0910   CL 103 10/19/2011 0950   CO2 22 10/22/2012 1110   CO2 26 10/19/2011 0950   BUN 16.5 10/22/2012  1110   BUN 6 10/19/2011 0950   CREATININE 0.9 10/22/2012 1110   CREATININE 0.61 10/19/2011 0950   CREATININE 1.03 03/13/2011 0812      Component Value Date/Time   CALCIUM 9.1 10/22/2012 1110   CALCIUM 9.1 10/19/2011 0950   ALKPHOS 74 10/22/2012 1110   ALKPHOS 58 07/26/2011 1332   AST 9 10/22/2012 1110   AST 13 07/26/2011 1332   ALT 9 10/22/2012 1110   ALT 29 07/26/2011 1332   BILITOT 1.37* 10/22/2012 1110   BILITOT 0.2* 07/26/2011 1332      ASSESSMENT: Myeloma in remission, status post autologous stem cell transplant, now with pancytopenia and evidence of chronic eosinophilic leukemia, AML cannot be excluded, and with severe back pain   PLAN:  #1 multiple myeloma Is soft bone marrow aspirate biopsy showed no evidence of disease. We'll continue observation. Revlimid has been discontinued #2 chronic eosinophilic leukemia I will contact his transplant physician in [facility] for further discussion. We are waiting for HLA typing of his siblings. The patient has a lot of questions pertaining to treatment for chronic eosinophilic leukemia. I tried to my best knowledge counts are all  his questions. #3 pancytopenia We will go ahead and transfuse him with platelets today. We'll continue to work on a weekly basis and transfuse if he meets parameters. #4 severe back pain Desisted your recent fracture. I have increased his oxycodone to 10 mg to use as needed. #5 smoking I discussed with the patient the importance of smoking cessation.  All questions were answered. The patient knows to call the clinic with any problems, questions or concerns. We can certainly see the patient much sooner if necessary. No barriers to learning was detected. I spent 40 minutes counseling the patient face to face. The total time spent in the appointment was 60 minutes and more than 50% was on counseling.     Cailean Heacock, MD 11/19/2012 10:47 AM

## 2012-11-20 LAB — PREPARE PLATELET PHERESIS

## 2012-11-21 IMAGING — CR DG CHEST 1V PORT
1 series · 1 of 1 positions shown · non-contrast
Comparison: [birth year]19[birth year]

CLINICAL DATA: Pulmonary edema

PORTABLE CHEST - 1 VIEW

[AP]
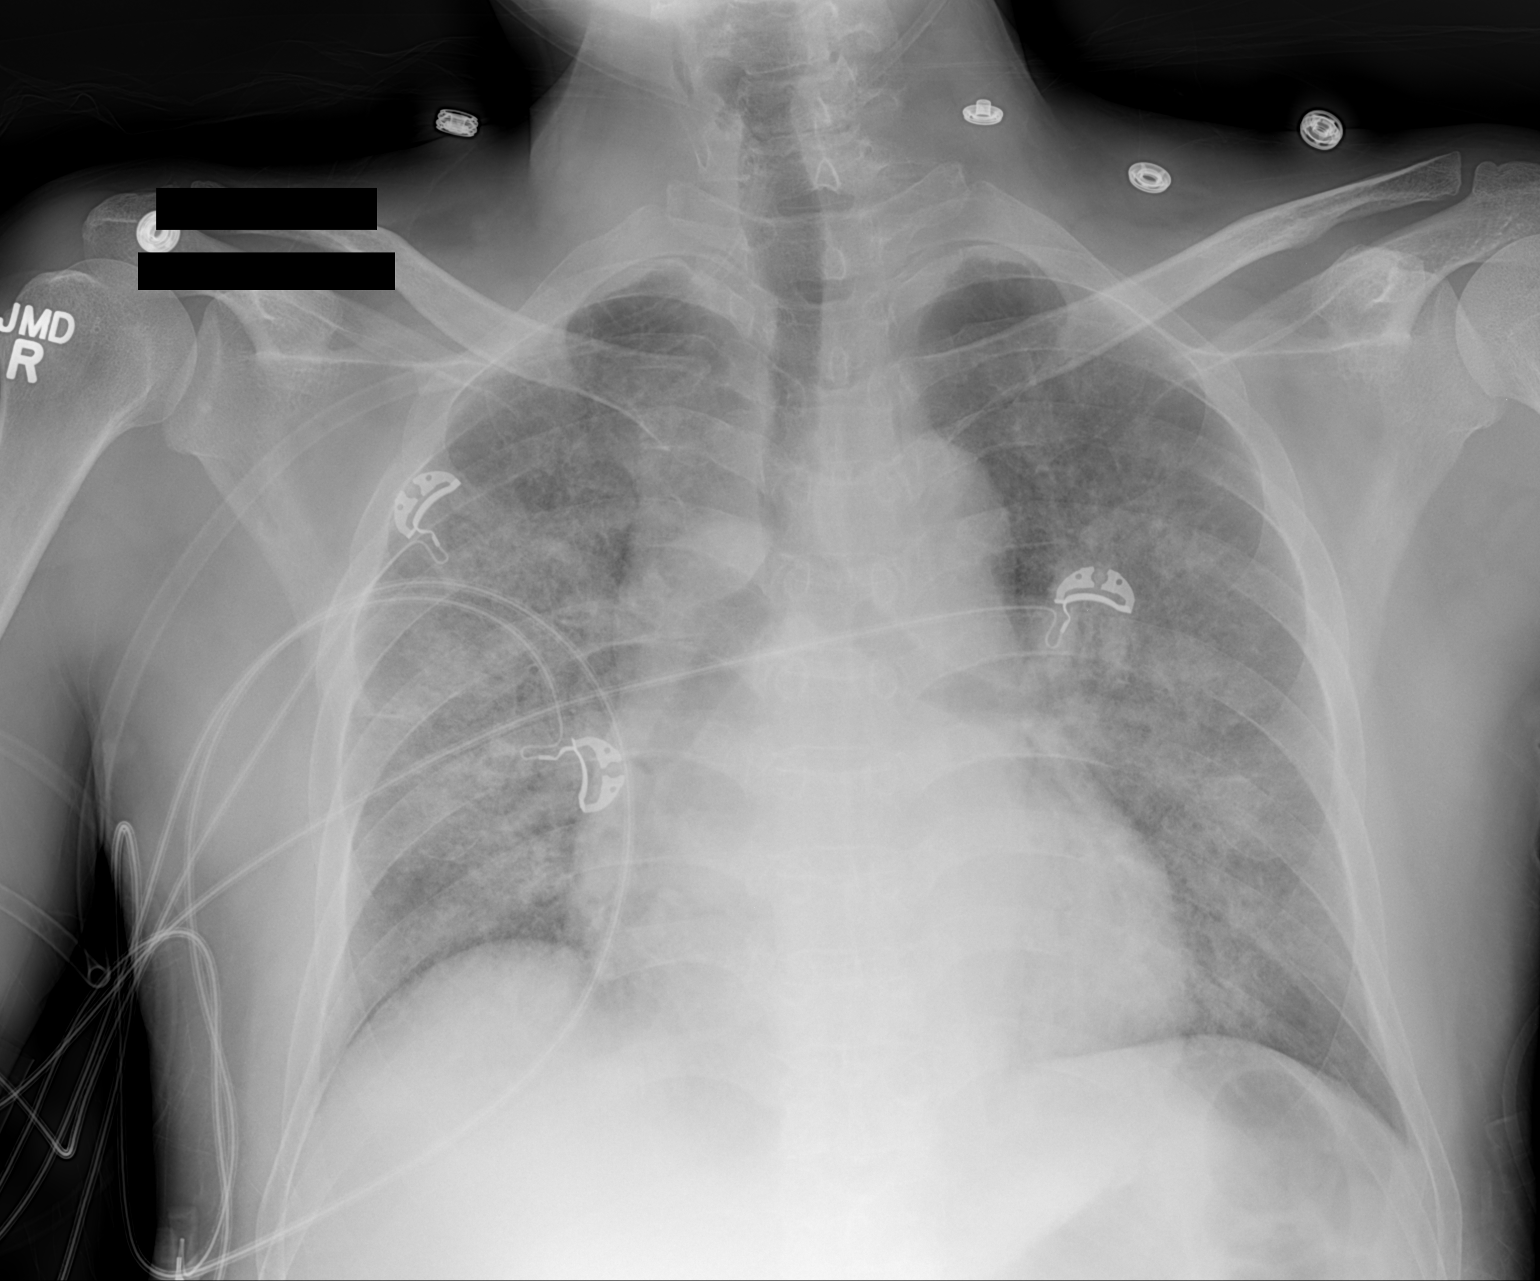

[1 of 1 positions shown; findings below may reference images not displayed]

FINDINGS: Persistent moderate enlargement of the cardiomediastinal
silhouette noted with improving fluffy perihilar airspace
opacities.  Trace if any pleural fluid noted.
IMPRESSION: Improving perihilar airspace opacities compatible with improving
edema.

## 2012-11-24 ENCOUNTER — Telehealth: Payer: Self-pay | Admitting: Hematology and Oncology

## 2012-11-24 ENCOUNTER — Other Ambulatory Visit: Payer: Self-pay | Admitting: Hematology and Oncology

## 2012-11-24 ENCOUNTER — Other Ambulatory Visit (HOSPITAL_BASED_OUTPATIENT_CLINIC_OR_DEPARTMENT_OTHER): Payer: Medicaid Other

## 2012-11-24 ENCOUNTER — Other Ambulatory Visit: Payer: Self-pay | Admitting: *Deleted

## 2012-11-24 ENCOUNTER — Telehealth: Payer: Self-pay | Admitting: *Deleted

## 2012-11-24 ENCOUNTER — Ambulatory Visit: Payer: Medicaid Other

## 2012-11-24 VITALS — BP 108/67 | HR 143 | Temp 103.4°F | Resp 20

## 2012-11-24 DIAGNOSIS — M549 Dorsalgia, unspecified: Secondary | ICD-10-CM

## 2012-11-24 DIAGNOSIS — D649 Anemia, unspecified: Secondary | ICD-10-CM

## 2012-11-24 DIAGNOSIS — C9200 Acute myeloblastic leukemia, not having achieved remission: Secondary | ICD-10-CM

## 2012-11-24 DIAGNOSIS — C9000 Multiple myeloma not having achieved remission: Secondary | ICD-10-CM

## 2012-11-24 LAB — CBC WITH DIFFERENTIAL/PLATELET
BASO%: 0.9 % (ref 0.0–2.0)
Basophils Absolute: 0 10*3/uL (ref 0.0–0.1)
Eosinophils Absolute: 0.2 10*3/uL (ref 0.0–0.5)
HGB: 6.8 g/dL — CL (ref 13.0–17.1)
MONO#: 0.3 10*3/uL (ref 0.1–0.9)
NEUT#: 1.4 10*3/uL — ABNORMAL LOW (ref 1.5–6.5)
NEUT%: 40.5 % (ref 39.0–75.0)
RDW: 22 % — ABNORMAL HIGH (ref 11.0–14.6)
WBC: 3.4 10*3/uL — ABNORMAL LOW (ref 4.0–10.3)
lymph#: 1.4 10*3/uL (ref 0.9–3.3)
nRBC: 2 % — ABNORMAL HIGH (ref 0–0)

## 2012-11-24 LAB — TECHNOLOGIST REVIEW

## 2012-11-24 MED ORDER — ACETAMINOPHEN 325 MG PO TABS
650.0000 mg | ORAL_TABLET | Freq: Once | ORAL | Status: AC
  Administered 2012-11-24: 650 mg via ORAL

## 2012-11-24 MED ORDER — ACETAMINOPHEN 325 MG PO TABS
325.0000 mg | ORAL_TABLET | Freq: Four times a day (QID) | ORAL | Status: DC | PRN
  Administered 2012-11-24: 325 mg via ORAL

## 2012-11-24 MED ORDER — SODIUM CHLORIDE 0.9 % IV SOLN
250.0000 mL | Freq: Once | INTRAVENOUS | Status: AC
  Administered 2012-11-24: 250 mL via INTRAVENOUS

## 2012-11-24 MED ORDER — ACETAMINOPHEN 325 MG PO TABS
ORAL_TABLET | ORAL | Status: AC
  Filled 2012-11-24: qty 1

## 2012-11-24 MED ORDER — DIPHENHYDRAMINE HCL 25 MG PO CAPS
ORAL_CAPSULE | ORAL | Status: AC
  Filled 2012-11-24: qty 1

## 2012-11-24 MED ORDER — DIPHENHYDRAMINE HCL 25 MG PO CAPS
25.0000 mg | ORAL_CAPSULE | Freq: Once | ORAL | Status: AC
  Administered 2012-11-24: 25 mg via ORAL

## 2012-11-24 MED ORDER — ACETAMINOPHEN 325 MG PO TABS
ORAL_TABLET | ORAL | Status: AC
  Filled 2012-11-24: qty 2

## 2012-11-24 NOTE — Telephone Encounter (Signed)
Called pt via [employer]. Home number d/c and not able to reach pt at cell. Someone answers but no response. Mailed schedule re 10/6 lb/fu and added comments to lb appt today to recheck demographics and send pt for new schedule.

## 2012-11-24 NOTE — Patient Instructions (Addendum)
Per Dr Bertis Ruddy go directly to [facility] hospital to be admitted.

## 2012-11-24 NOTE — Telephone Encounter (Signed)
PT. IS VERY WEAK AND DIZZINESS. HE ALSO HAS PAIN IN BOTH SIDES OF HIS RIBS AT A SCALE OF SIX. HIS PAIN MEDICATION DOES NOT HELP. NOTIFIED DR.GORSUCH'S DIXIE SMITH,RN. THIS INFORMATION AND PT.'S CBC RESULTS PLACED ON DR.GORSUCH'S DESK.

## 2012-11-24 NOTE — Telephone Encounter (Signed)
Added f/u appt for 9/30. gv pt/interpreter new appt schedule while in lobby waiting for inf.

## 2012-11-24 NOTE — Progress Notes (Signed)
Pt into receive 1 unit PRBC's. Pt's temp normal (98.0) but having hard chills.  Pt's temp taken before Tylenol given. Pt's temp 99.5. Pt denies any sign/symptoms of infection or any recent fevers. Dr Bertis Ruddy notified of pt's temp and orders received to proceed with Tylenol and Benadryl and Blood infusion. Notify Physician  if temp reaches 101.0 or greater. 1512 Pt;s temp 103.4 Dr Bertis Ruddy notified. Orders received to give Tylenol 325 mg po now. Hold blood for now. Dr Bertis Ruddy to come assess patient. 1522 Tylenol 325 mg po given. Dr Bertis Ruddy into assess patient, pt to go to [facility] for admitt to ED per Dr Bertis Ruddy. Pt verbalized understanding (through interpreter). Pt discharged via wheelchair. Pt denies needs or complaints at this time.

## 2012-11-25 ENCOUNTER — Ambulatory Visit: Payer: Self-pay | Admitting: Hematology and Oncology

## 2012-11-25 LAB — SPEP & IFE WITH QIG
Beta 2: 2.2 % — ABNORMAL LOW (ref 3.2–6.5)
Beta Globulin: 6 % (ref 4.7–7.2)
IgA: 97 mg/dL (ref 68–379)
IgG (Immunoglobin G), Serum: 1770 mg/dL — ABNORMAL HIGH (ref 650–1600)
IgM, Serum: 14 mg/dL — ABNORMAL LOW (ref 41–251)
M-Spike, %: 0.86 g/dL
Total Protein, Serum Electrophoresis: 7.5 g/dL (ref 6.0–8.3)

## 2012-11-25 LAB — KAPPA/LAMBDA LIGHT CHAINS: Kappa free light chain: 1.64 mg/dL (ref 0.33–1.94)

## 2012-11-25 LAB — HOLD TUBE, BLOOD BANK

## 2012-11-25 LAB — BETA 2 MICROGLOBULIN, SERUM: Beta-2 Microglobulin: 2.05 mg/L — ABNORMAL HIGH (ref 1.01–1.73)

## 2012-11-28 ENCOUNTER — Other Ambulatory Visit: Payer: Self-pay | Admitting: Hematology and Oncology

## 2012-11-28 DIAGNOSIS — D649 Anemia, unspecified: Secondary | ICD-10-CM

## 2012-11-28 LAB — TYPE AND SCREEN
Antibody Screen: NEGATIVE
Unit division: 0

## 2012-11-30 IMAGING — XA IR KYPHOPLASTY LUMBAR INIT
1 series · 14 of 22 positions shown · non-contrast
Comparison: Plain films of the lumbosacral spine of 04/06/2011.

CLINICAL DATA: Worsening lower lumbar pain secondary to new
compression fracture at L4.  History of multiple myeloma.

LUMBAR KYPHOPLASTY AT L4

[Series 300: spine · 14 of 22 slices shown]
[im 1/22]
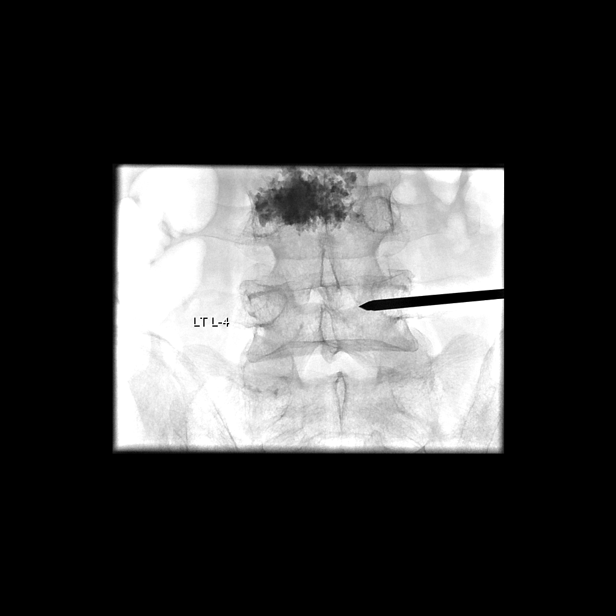
[im 3/22]
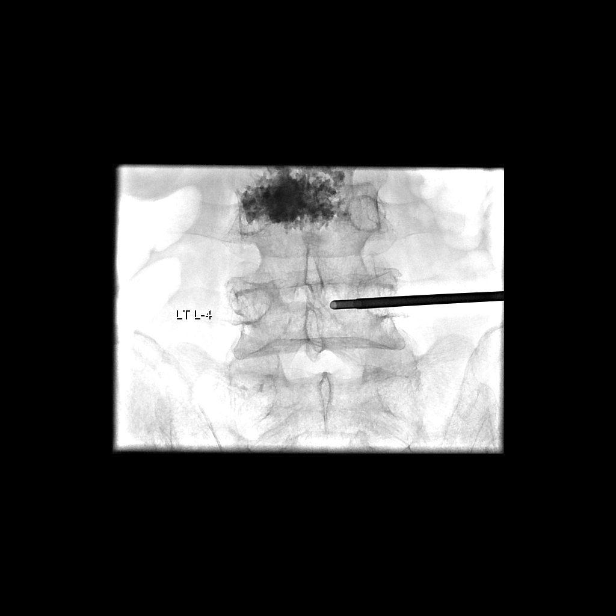
[im 4/22]
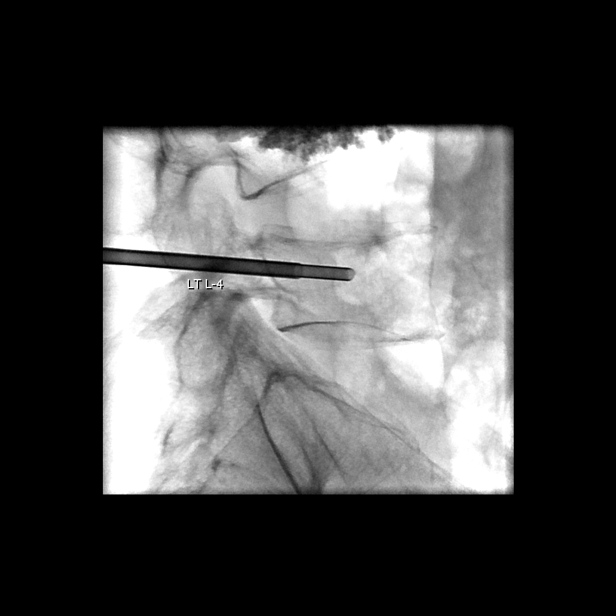
[im 6/22]
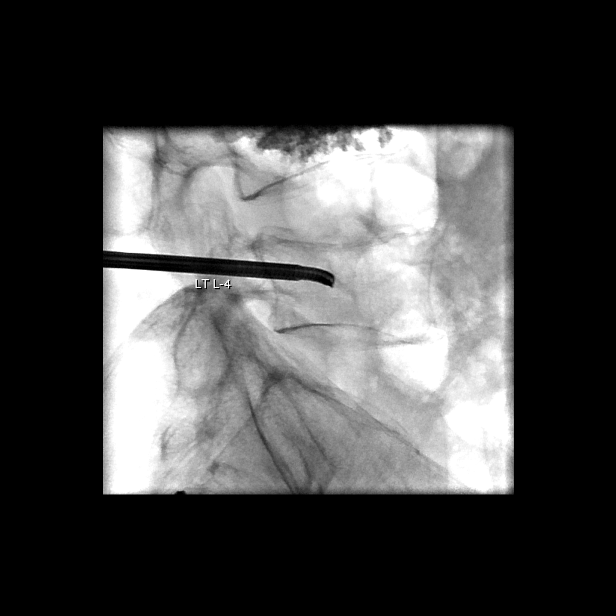
[im 8/22]
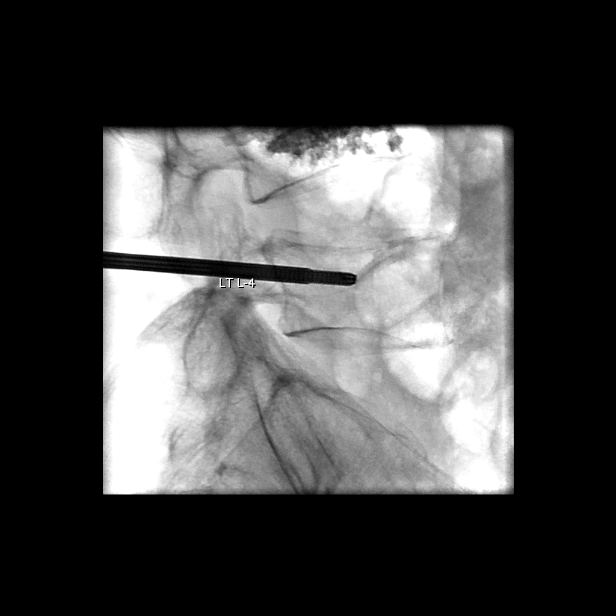
[im 9/22]
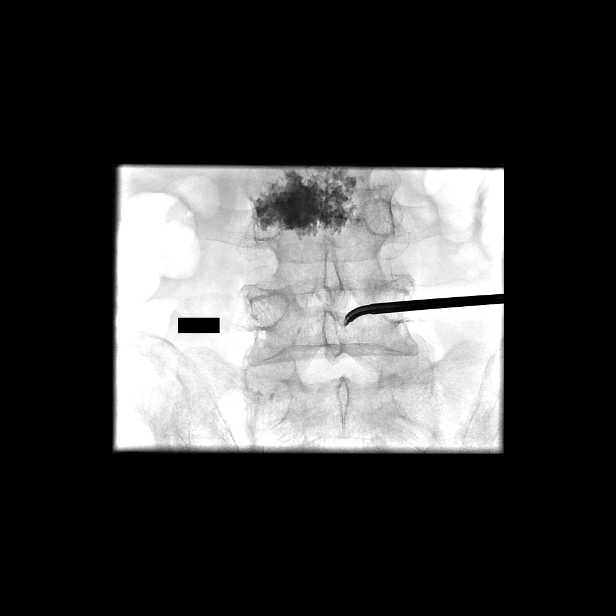
[im 11/22]
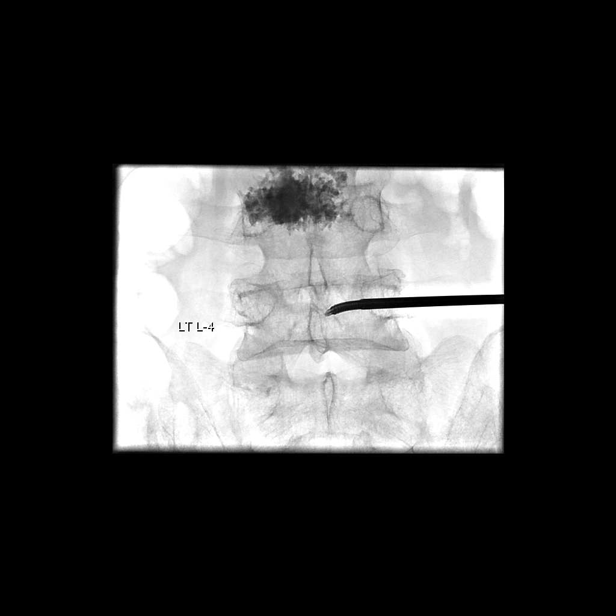
[im 12/22]
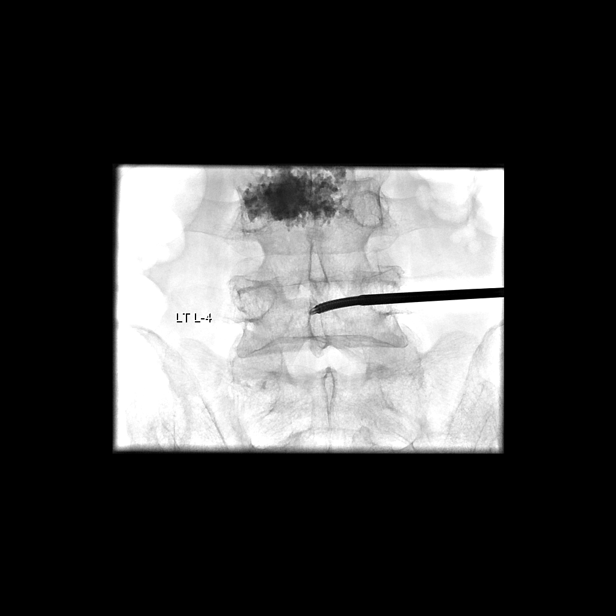
[im 14/22]
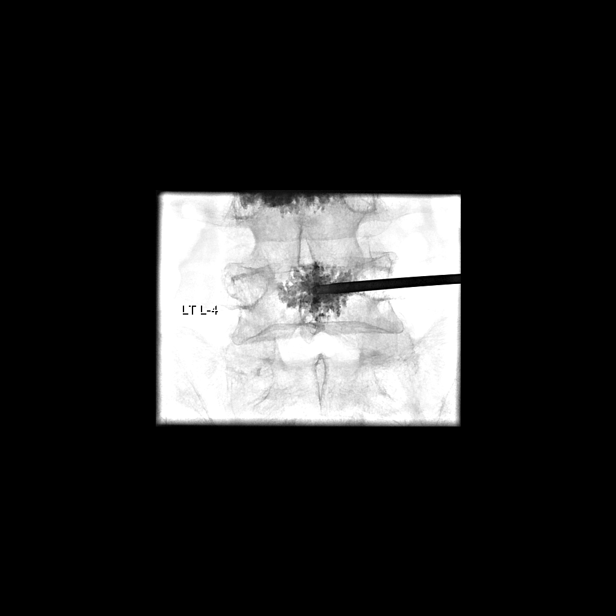
[im 15/22]
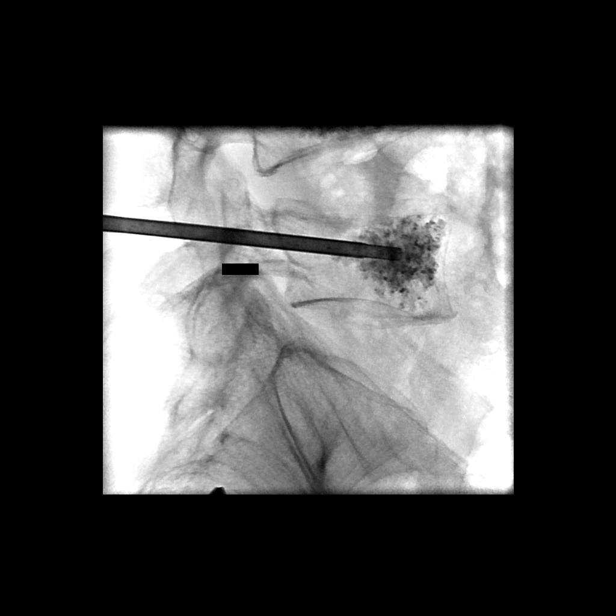
[im 17/22]
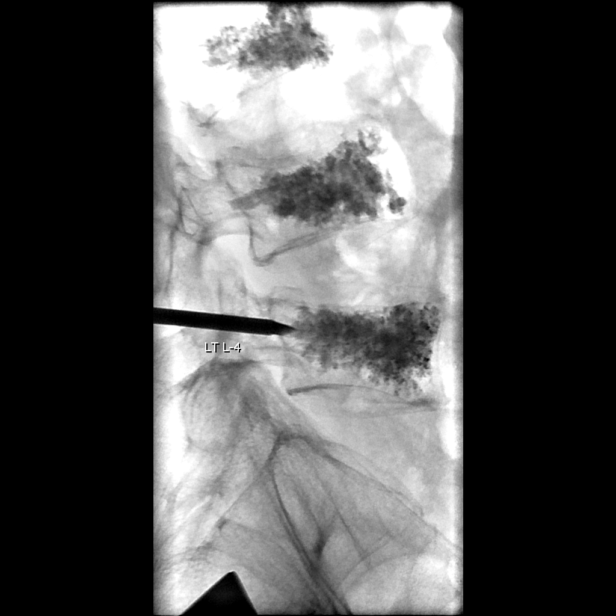
[im 19/22]
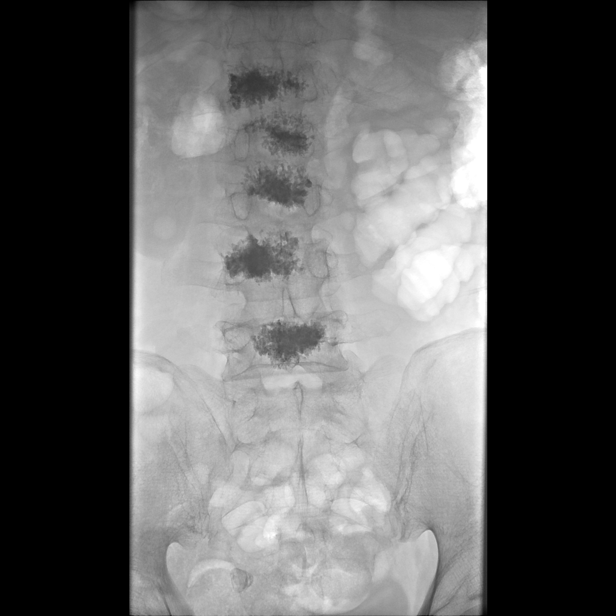
[im 20/22]
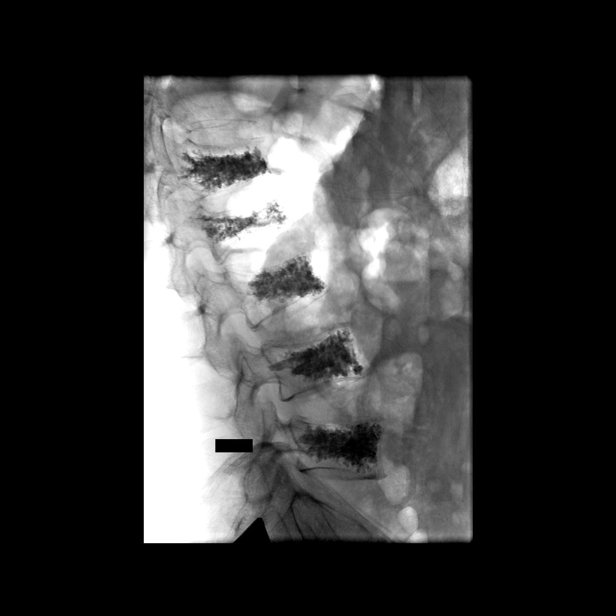
[im 22/22]
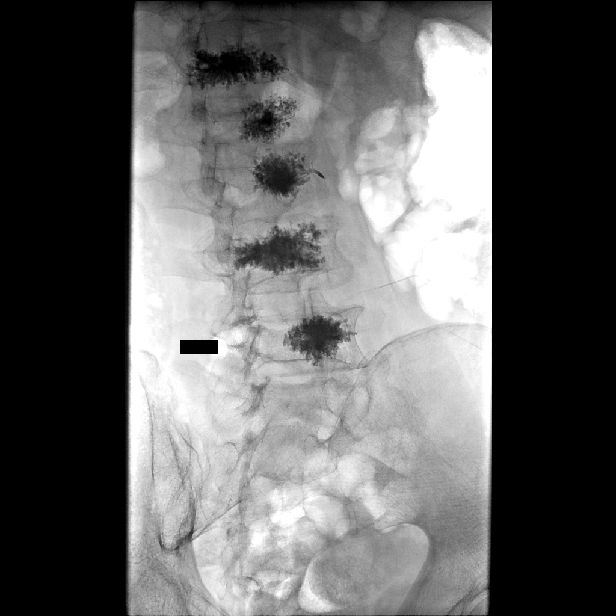

[14 of 22 positions shown; findings below may reference images not displayed]

Following a full explanation of the procedure along with the
potential associated complications, an informed witnessed consent
was obtained.

The patient was placed prone on the fluoroscopic table.  The skin
overlying the thoracolumbar region was then prepped and draped in
the usual sterile fashion.

The right pedicle at L4 was infiltrated with 0.25% bupivacaine.
Using biplane intermittent fluoroscopy, a 10.5-gauge DFine spine
needle was then advanced to the posterior third at L4.

Using biplane intermittent fluoroscopy, a DFine core biopsy was
then obtained using a 20 ml syringe.

This was then sent for pathologic analysis.

Through the working needle, a DFine void-creating device was then
advanced and manipulated to create a void.  This was then removed.

The needle was then advanced to the junction of the anterior and
middle third at L4.

At this time methylmethacrylate mixture was reconstituted with
Tobramycin in the DFine mixing system.  This was then loaded onto
the injector device.  The injector device was locked into position
at the hub of the working needle.

Using biplane intermittent fluoroscopy, methylmethacrylate mixture
was then injected at L4 with excellent filling in the AP and
lateral projections.

There was no extrusion into disc spaces or posteriorly into the
spinal canal.  No paraspinous venous contamination was seen.

The patient tolerated the procedure well.  There were no acute
complications.

The needle was then removed.  Hemostasis was achieved at the skin
entry site.

Medications utilized: Versed 3 mg IV.  Fentanyl 75 mcg IV.
Dilaudid 1 mg IV.
IMPRESSION: 1. Status post fluoroscopic guided needle placement for deep core
bone biopsy at L4.
2.  Status post vertebral body augmentation for painful pathologic
compression fracture at L4 using the DFine void-creating technique.

## 2012-12-01 ENCOUNTER — Ambulatory Visit: Payer: Self-pay | Admitting: Hematology and Oncology

## 2012-12-01 ENCOUNTER — Other Ambulatory Visit: Payer: Self-pay

## 2012-12-06 IMAGING — CR DG CERVICAL SPINE COMPLETE 4+V
5 series · 5 of 5 positions shown · non-contrast
Comparison: 03/14/2011

CLINICAL DATA: Left shoulder pain and left hand weakness.  Multiple
myeloma

CERVICAL SPINE - COMPLETE 4+ VIEW

[w c-spine lat *]
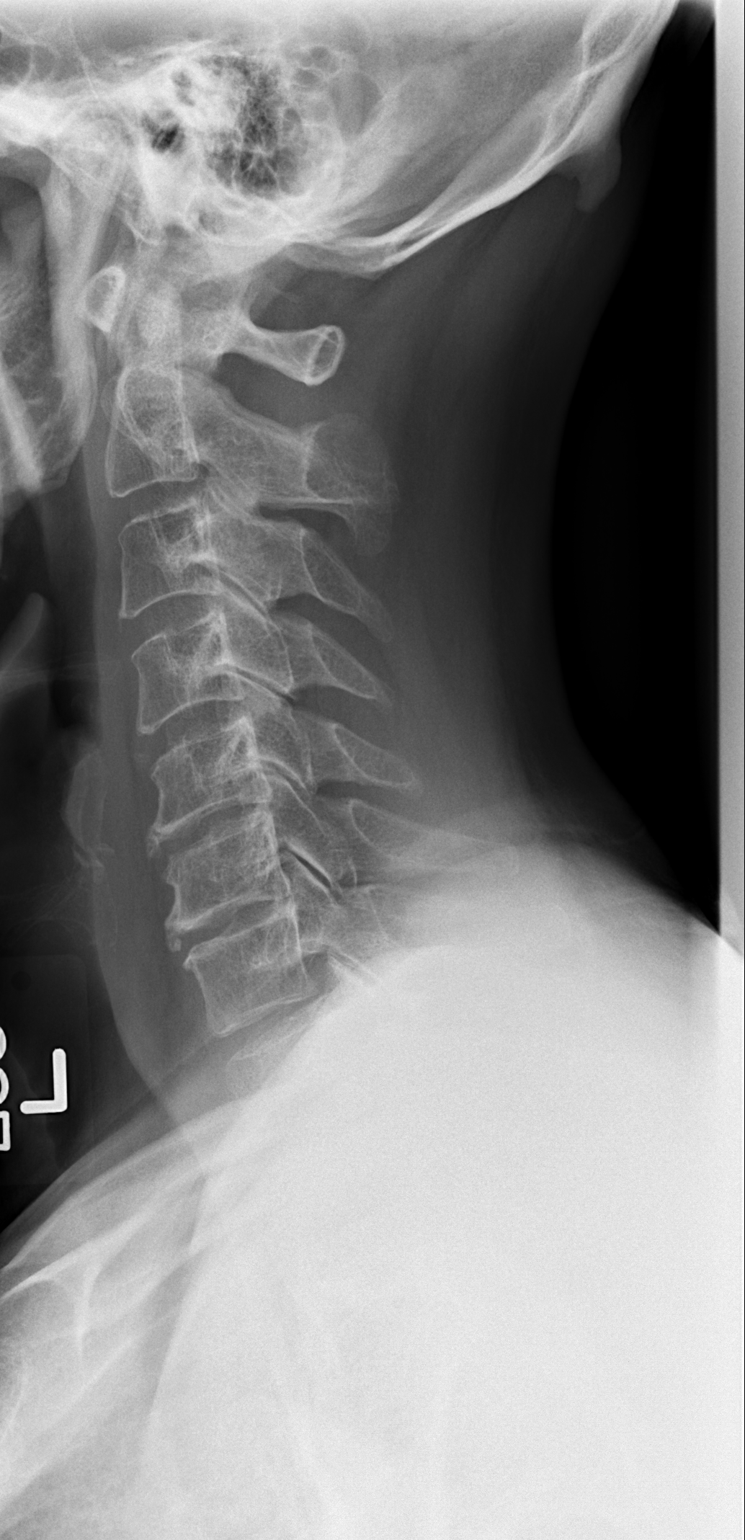

[w c-spine oblique (1 of 2)]
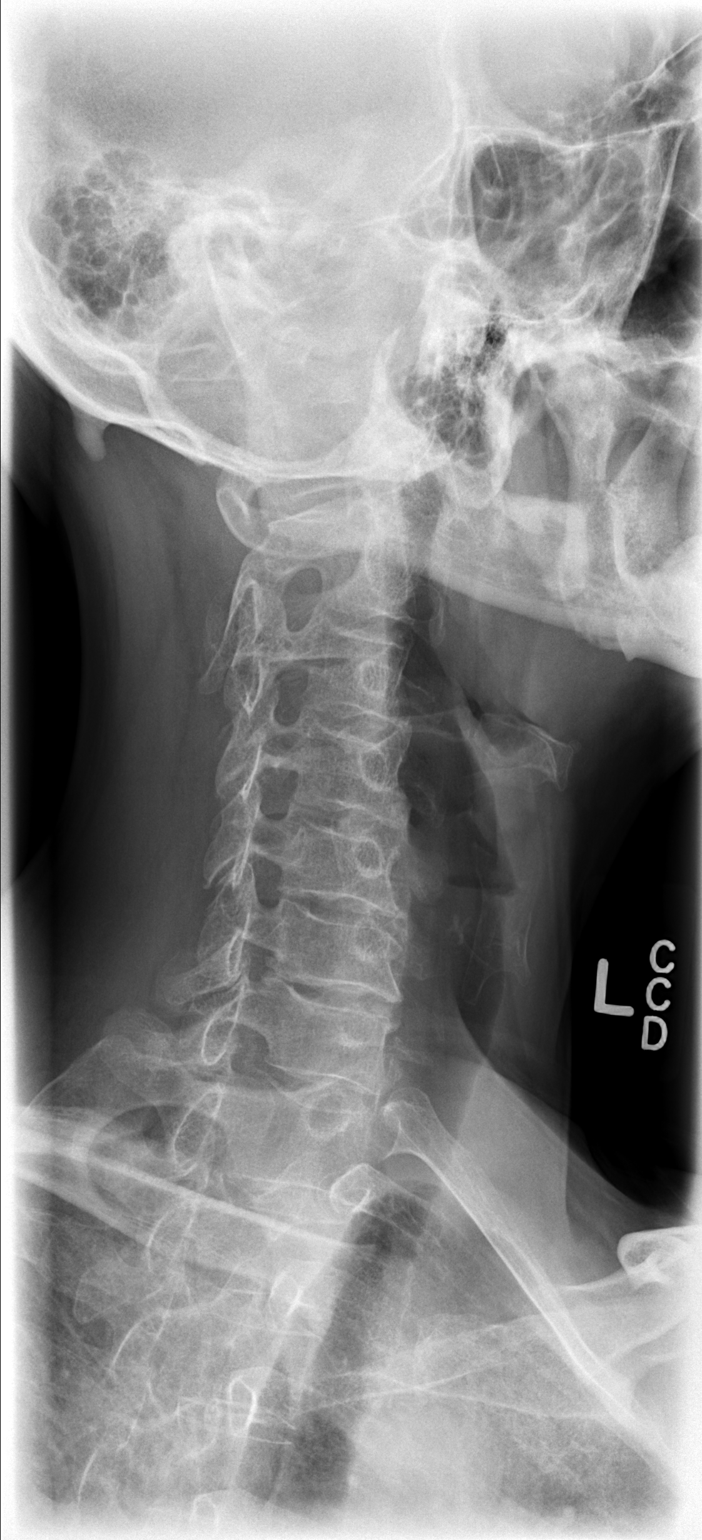

[w c-spine oblique (2 of 2)]
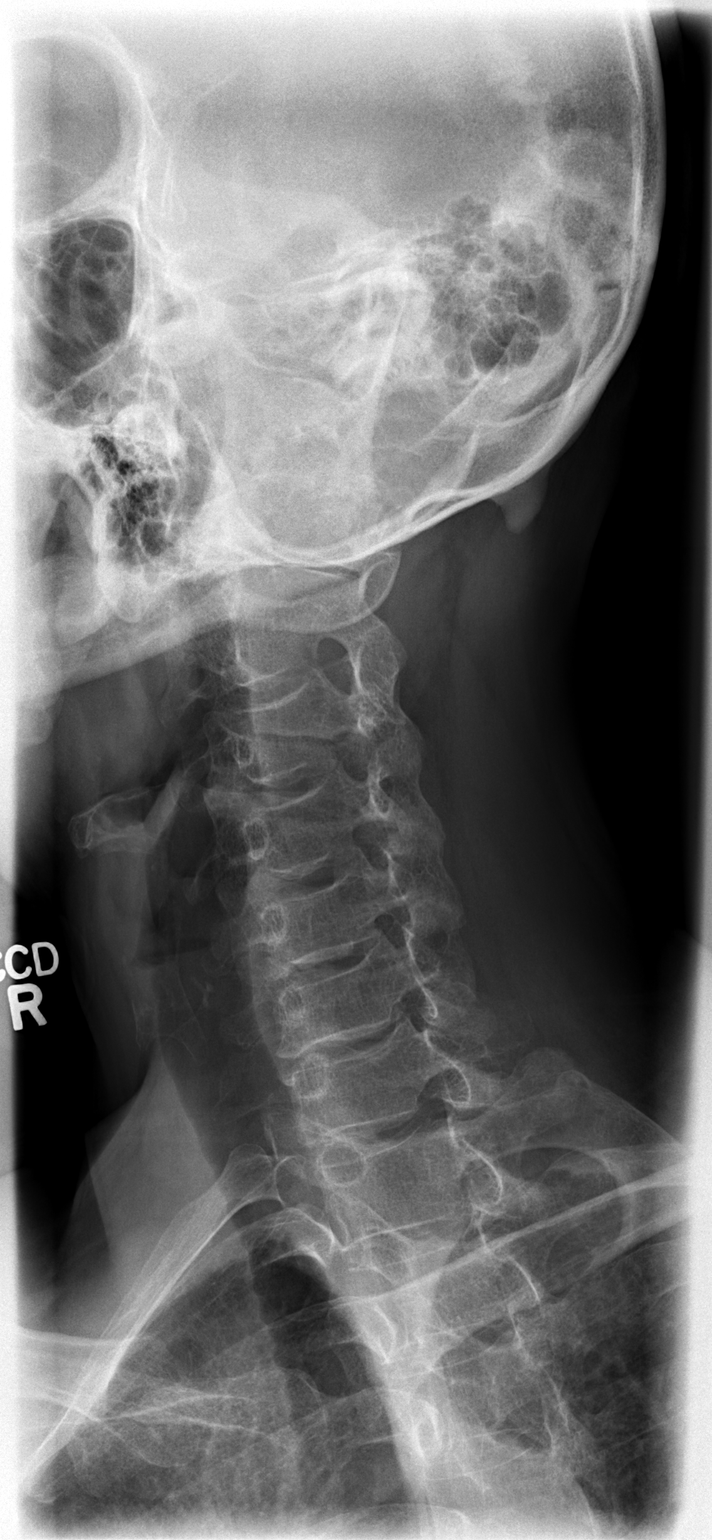

[w c-spine a.p. *]
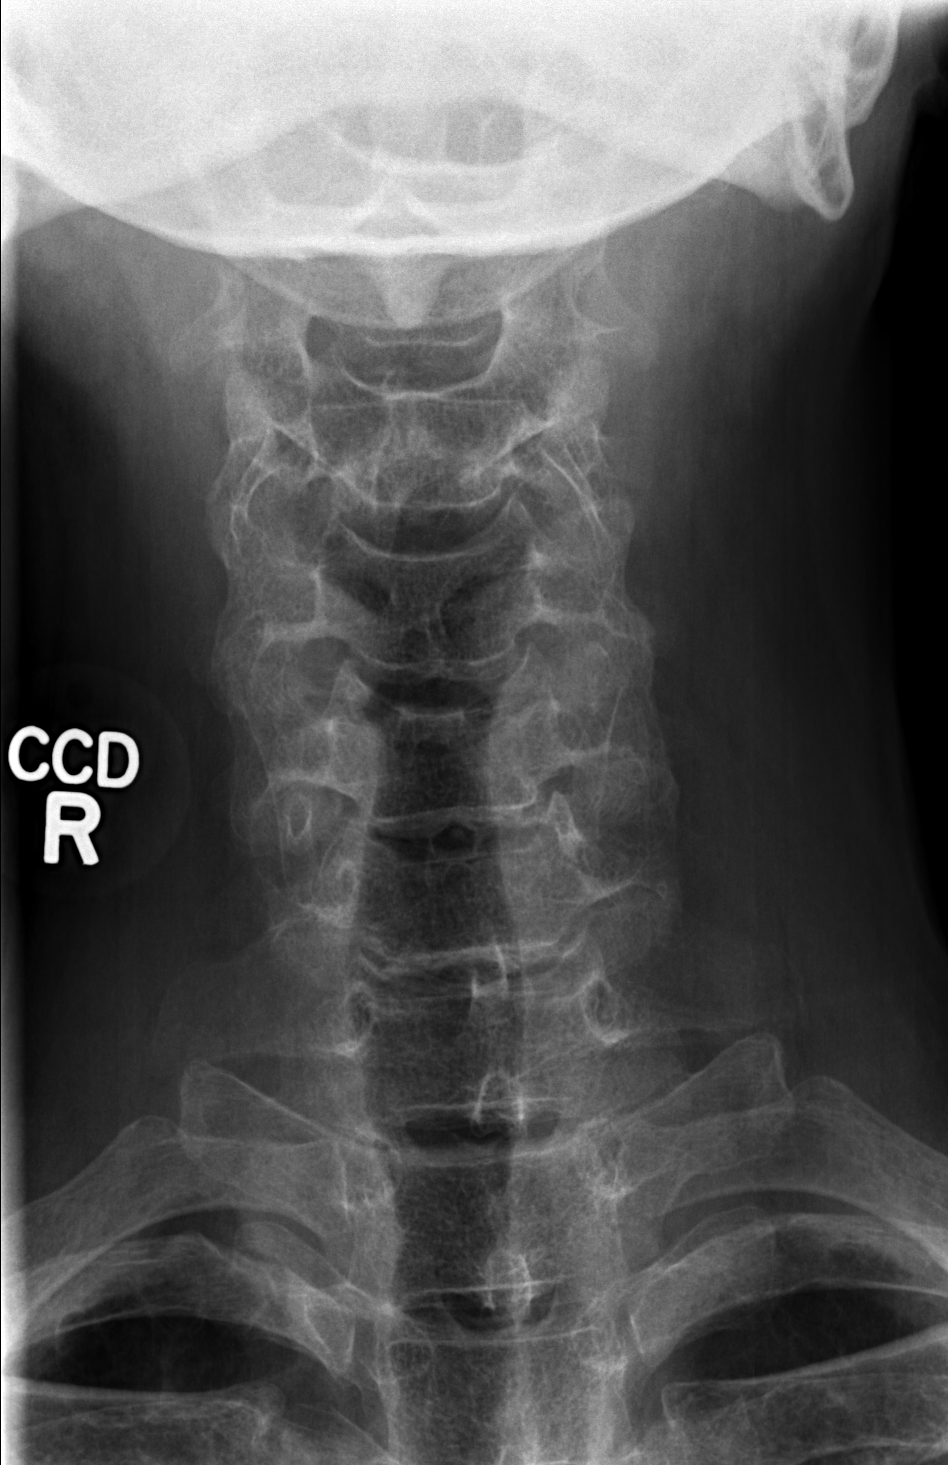

[w c-spine odontoid *]
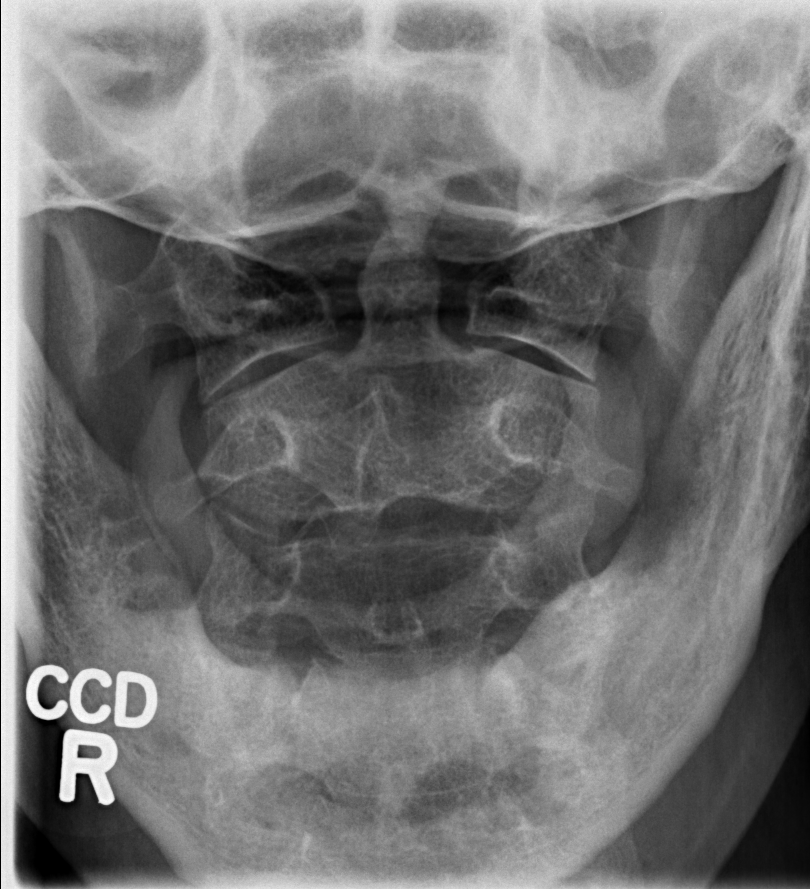

[5 of 5 positions shown; findings below may reference images not displayed]

FINDINGS: Normal alignment.  Straightening of the cervical
lordosis.  Negative for fracture.

Mild disc degeneration and spondylosis at C5-6 and C6-7 with mild
foraminal narrowing bilaterally.  This is most prominent on the
right at C6-7.

Possible lytic lesions in the  C3, C4 and C5 vertebral bodies.
This could be due to myeloma.  I would suggest confirming this with
cross sectional imaging.  CT or MRI could be used for confirmation.
IMPRESSION: Possible myeloma lesions at C3, C4, and C5.  Recommend CT or MRI to
confirm.

Negative for fracture.

## 2012-12-17 ENCOUNTER — Telehealth: Payer: Self-pay | Admitting: *Deleted

## 2012-12-17 ENCOUNTER — Other Ambulatory Visit (HOSPITAL_BASED_OUTPATIENT_CLINIC_OR_DEPARTMENT_OTHER): Payer: Medicaid Other | Admitting: Lab

## 2012-12-17 ENCOUNTER — Ambulatory Visit (HOSPITAL_BASED_OUTPATIENT_CLINIC_OR_DEPARTMENT_OTHER): Payer: Medicaid Other

## 2012-12-17 VITALS — BP 122/77 | HR 96 | Temp 98.3°F

## 2012-12-17 DIAGNOSIS — D649 Anemia, unspecified: Secondary | ICD-10-CM

## 2012-12-17 DIAGNOSIS — C9000 Multiple myeloma not having achieved remission: Secondary | ICD-10-CM

## 2012-12-17 LAB — MANUAL DIFFERENTIAL
Basophil: 9 % — ABNORMAL HIGH (ref 0–2)
EOS: 8 % — ABNORMAL HIGH (ref 0–7)
MONO: 1 % (ref 0–14)
Metamyelocytes: 3 % — ABNORMAL HIGH (ref 0–0)
Myelocytes: 4 % — ABNORMAL HIGH (ref 0–0)
PLT EST: DECREASED
PROMYELO: 1 % — ABNORMAL HIGH (ref 0–0)
SEG: 1 % — ABNORMAL LOW (ref 38–77)

## 2012-12-17 LAB — CBC WITH DIFFERENTIAL/PLATELET
HGB: 10.1 g/dL — ABNORMAL LOW (ref 13.0–17.1)
MCV: 85.2 fL (ref 79.3–98.0)
RBC: 3.72 10*6/uL — ABNORMAL LOW (ref 4.20–5.82)
RDW: 16.6 % — ABNORMAL HIGH (ref 11.0–14.6)
WBC: 3.7 10*3/uL — ABNORMAL LOW (ref 4.0–10.3)

## 2012-12-17 MED ORDER — HEPARIN SOD (PORK) LOCK FLUSH 100 UNIT/ML IV SOLN
500.0000 [IU] | Freq: Once | INTRAVENOUS | Status: DC | PRN
  Filled 2012-12-17: qty 5

## 2012-12-17 MED ORDER — ALTEPLASE 2 MG IJ SOLR
2.0000 mg | Freq: Once | INTRAMUSCULAR | Status: DC | PRN
  Filled 2012-12-17: qty 2

## 2012-12-17 MED ORDER — ZOLEDRONIC ACID 4 MG/100ML IV SOLN
4.0000 mg | Freq: Once | INTRAVENOUS | Status: AC
  Administered 2012-12-17: 4 mg via INTRAVENOUS
  Filled 2012-12-17: qty 100

## 2012-12-17 MED ORDER — HEPARIN SOD (PORK) LOCK FLUSH 100 UNIT/ML IV SOLN
250.0000 [IU] | Freq: Once | INTRAVENOUS | Status: DC | PRN
  Filled 2012-12-17: qty 5

## 2012-12-17 MED ORDER — SODIUM CHLORIDE 0.9 % IJ SOLN
3.0000 mL | Freq: Once | INTRAMUSCULAR | Status: DC | PRN
  Filled 2012-12-17: qty 10

## 2012-12-17 MED ORDER — ZOLEDRONIC ACID 4 MG/5ML IV CONC: 4.0000 mg | Freq: Once | INTRAVENOUS | Status: DC

## 2012-12-17 MED ORDER — SODIUM CHLORIDE 0.9 % IV SOLN
INTRAVENOUS | Status: DC
  Administered 2012-12-17: 10:00:00 via INTRAVENOUS

## 2012-12-17 MED ORDER — SODIUM CHLORIDE 0.9 % IJ SOLN
10.0000 mL | INTRAMUSCULAR | Status: DC | PRN
  Filled 2012-12-17: qty 10

## 2012-12-17 NOTE — Telephone Encounter (Signed)
[name] [phone] and s/w [name].  Asked her to relay message via interpreter to pt regarding reason unable to order Portacath is due to low platelets and not safe for pt to have surgery at this time per surgeons/radiologists here..  Please advise pt to ask his doctor at [facility] if they can do Portacath at [facility].   She will have interpreter relay this message to pt.Shane Woodard

## 2012-12-17 NOTE — Progress Notes (Signed)
Reviewed CBC results with Dr. Farris Has transfusion required today. Confirmed patient has an appointment at [facility] on 12/22/12-has no issues at this time, except he wants a PAC soon. Reports he is getting tired of all the sticks "they hurt".

## 2012-12-17 NOTE — Patient Instructions (Signed)
[facility] Discharge Instructions for Patients Receiving Chemotherapy  Today you received the following : Zometa  BELOW ARE SYMPTOMS THAT SHOULD BE REPORTED IMMEDIATELY:  *FEVER GREATER THAN 100.5 F  *CHILLS WITH OR WITHOUT FEVER  NAUSEA AND VOMITING THAT IS NOT CONTROLLED WITH YOUR NAUSEA MEDICATION  *UNUSUAL SHORTNESS OF BREATH  *UNUSUAL BRUISING OR BLEEDING  TENDERNESS IN MOUTH AND THROAT WITH OR WITHOUT PRESENCE OF ULCERS  *URINARY PROBLEMS  *BOWEL PROBLEMS  UNUSUAL RASH Items with * indicate a potential emergency and should be followed up as soon as possible.  Feel free to call the clinic you have any questions or concerns. The clinic phone number is [phone].   Zoledronic Acid injection (Hypercalcemia, Oncology) What is this medicine? ZOLEDRONIC ACID (ZOE le dron ik AS id) lowers the amount of calcium loss from bone. It is used to treat too much calcium in your blood from cancer. It is also used to prevent complications of cancer that has spread to the bone. This medicine may be used for other purposes; ask your health care provider or pharmacist if you have questions. What should I tell my health care provider before I take this medicine? They need to know if you have any of these conditions: -aspirin-sensitive asthma -dental disease -kidney disease -an unusual or allergic reaction to zoledronic acid, other medicines, foods, dyes, or preservatives -pregnant or trying to get pregnant -breast-feeding How should I use this medicine? This medicine is for infusion into a vein. It is given by a health care professional in a hospital or clinic setting. Talk to your pediatrician regarding the use of this medicine in children. Special care may be needed. Overdosage: If you think you have taken too much of this medicine contact a poison control center or emergency room at once. NOTE: This medicine is only for you. Do not share this medicine with  others. What if I miss a dose? It is important not to miss your dose. Call your doctor or health care professional if you are unable to keep an appointment. What may interact with this medicine? -certain antibiotics given by injection -NSAIDs, medicines for pain and inflammation, like ibuprofen or naproxen -some diuretics like bumetanide, furosemide -teriparatide -thalidomide This list may not describe all possible interactions. Give your health care provider a list of all the medicines, herbs, non-prescription drugs, or dietary supplements you use. Also tell them if you smoke, drink alcohol, or use illegal drugs. Some items may interact with your medicine. What should I watch for while using this medicine? Visit your doctor or health care professional for regular checkups. It may be some time before you see the benefit from this medicine. Do not stop taking your medicine unless your doctor tells you to. Your doctor may order blood tests or other tests to see how you are doing. Women should inform their doctor if they wish to become pregnant or think they might be pregnant. There is a potential for serious side effects to an unborn child. Talk to your health care professional or pharmacist for more information. You should make sure that you get enough calcium and vitamin D while you are taking this medicine. Discuss the foods you eat and the vitamins you take with your health care professional. Some people who take this medicine have severe bone, joint, and/or muscle pain. This medicine may also increase your risk for a broken thigh bone. Tell your doctor right away if you have pain in your upper leg or groin.  Tell your doctor if you have any pain that does not go away or that gets worse. What side effects may I notice from receiving this medicine? Side effects that you should report to your doctor or health care professional as soon as possible: -allergic reactions like skin rash, itching or hives,  swelling of the face, lips, or tongue -anxiety, confusion, or depression -breathing problems -changes in vision -feeling faint or lightheaded, falls -jaw burning, cramping, pain -muscle cramps, stiffness, or weakness -trouble passing urine or change in the amount of urine Side effects that usually do not require medical attention (report to your doctor or health care professional if they continue or are bothersome): -bone, joint, or muscle pain -fever -hair loss -irritation at site where injected -loss of appetite -nausea, vomiting -stomach upset -tired This list may not describe all possible side effects. Call your doctor for medical advice about side effects. You may report side effects to FDA at 1-800-FDA-1088. Where should I keep my medicine? This drug is given in a hospital or clinic and will not be stored at home. NOTE: This sheet is a summary. It may not cover all possible information. If you have questions about this medicine, talk to your doctor, pharmacist, or health care provider.  2013, [employer]. (08/11/2010 9:06:58 AM)

## 2012-12-17 NOTE — Telephone Encounter (Signed)
Message copied by Wende Mott on Wed Dec 17, 2012 12:31 PM ------      Message from: [facility], [place]      Created: Wed Dec 17, 2012 12:17 PM      Regarding: FW: Vascular Access       Please try to call him via interpreter      His platelet count being so low would make placement dangerous and most physicians would refuse placement here      Please ask if he can have it done at [facility]      ----- Message -----         From: Wandalee Ferdinand, RN         Sent: 12/17/2012  11:00 AM           To: Artis Delay, MD      Subject: Vascular Access                                          FYI-      Patient requesting PAC today. Reports he has requested this at least 5 times.            Thank You,      Kem Boroughs, RN       ------

## 2012-12-31 ENCOUNTER — Other Ambulatory Visit: Payer: Self-pay | Admitting: Hematology and Oncology

## 2012-12-31 DIAGNOSIS — C9200 Acute myeloblastic leukemia, not having achieved remission: Secondary | ICD-10-CM

## 2012-12-31 DIAGNOSIS — D649 Anemia, unspecified: Secondary | ICD-10-CM

## 2012-12-31 DIAGNOSIS — C9000 Multiple myeloma not having achieved remission: Secondary | ICD-10-CM

## 2012-12-31 DIAGNOSIS — M549 Dorsalgia, unspecified: Secondary | ICD-10-CM

## 2012-12-31 MED ORDER — OXYCODONE HCL 5 MG PO TABS: 10.0000 mg | ORAL_TABLET | ORAL | Status: AC | PRN

## 2013-01-01 ENCOUNTER — Other Ambulatory Visit: Payer: Self-pay

## 2013-01-13 ENCOUNTER — Telehealth: Payer: Self-pay | Admitting: *Deleted

## 2013-01-13 ENCOUNTER — Other Ambulatory Visit: Payer: Self-pay | Admitting: Hematology and Oncology

## 2013-01-13 DIAGNOSIS — C9000 Multiple myeloma not having achieved remission: Secondary | ICD-10-CM

## 2013-01-13 NOTE — Telephone Encounter (Signed)
Per Dr. Bertis Ruddy, cancel lab appt here tomorrow.  Pt is being taken care of at [facility].  Called interpreter services and s/w [name].  Asked her to have interpreter relay message to pt that he does not have any appts here.  He is seeing MD at [facility].  They will notify us if we need to see pt again.  She will have message given to pt.Shane Woodard

## 2013-01-14 ENCOUNTER — Other Ambulatory Visit: Payer: Self-pay | Admitting: Lab

## 2013-01-14 ENCOUNTER — Ambulatory Visit: Payer: Self-pay

## 2013-02-11 ENCOUNTER — Ambulatory Visit: Payer: Self-pay

## 2013-02-11 ENCOUNTER — Other Ambulatory Visit: Payer: Self-pay | Admitting: Lab

## 2013-05-06 ENCOUNTER — Encounter: Payer: Self-pay | Admitting: *Deleted

## 2013-05-06 NOTE — Progress Notes (Signed)
Note from Dr. Redmond Pulling at [facility] to Dr. Shirleen Schirmer received.  States pt was d/c'd from hospital home with [facility] on 04/09/13.

## 2013-05-27 DEATH — deceased

## 2013-10-07 ENCOUNTER — Encounter (HOSPITAL_COMMUNITY): Payer: Self-pay | Admitting: Dentistry

## 2016-12-22 ENCOUNTER — Other Ambulatory Visit: Payer: Self-pay | Admitting: Nurse Practitioner

## 2019-03-21 ENCOUNTER — Other Ambulatory Visit: Payer: Self-pay | Admitting: Nurse Practitioner
